# Patient Record
Sex: Female | Born: 1985 | Hispanic: No | Marital: Single | Smoking: Former smoker
Health system: Southern US, Community
[De-identification: ages and names within clinical notes are randomized; demographics above are authoritative.]

## PROBLEM LIST (undated history)

## (undated) DIAGNOSIS — F0390 Unspecified dementia without behavioral disturbance: Secondary | ICD-10-CM

## (undated) DIAGNOSIS — O99340 Other mental disorders complicating pregnancy, unspecified trimester: Secondary | ICD-10-CM

## (undated) DIAGNOSIS — F32A Depression, unspecified: Secondary | ICD-10-CM

## (undated) DIAGNOSIS — F419 Anxiety disorder, unspecified: Secondary | ICD-10-CM

## (undated) DIAGNOSIS — J45909 Unspecified asthma, uncomplicated: Secondary | ICD-10-CM

## (undated) DIAGNOSIS — F4310 Post-traumatic stress disorder, unspecified: Secondary | ICD-10-CM

## (undated) DIAGNOSIS — O24419 Gestational diabetes mellitus in pregnancy, unspecified control: Secondary | ICD-10-CM

## (undated) HISTORY — DX: Post-traumatic stress disorder, unspecified: F43.10

## (undated) HISTORY — DX: Unspecified dementia without behavioral disturbance: F03.90

## (undated) HISTORY — PX: NO PAST SURGERIES: SHX2092

## (undated) HISTORY — DX: Depression, unspecified: F32.A

## (undated) HISTORY — DX: Unspecified asthma, uncomplicated: J45.909

## (undated) HISTORY — DX: Other mental disorders complicating pregnancy, unspecified trimester: O99.340

## (undated) HISTORY — DX: Anxiety disorder, unspecified: F41.9

## (undated) HISTORY — DX: Gestational diabetes mellitus in pregnancy, unspecified control: O24.419

## (undated) HISTORY — DX: Morbid (severe) obesity due to excess calories: E66.01

---

## 2019-05-19 ENCOUNTER — Encounter: Payer: Self-pay | Admitting: *Deleted

## 2019-05-24 ENCOUNTER — Telehealth: Payer: Self-pay | Admitting: General Practice

## 2019-05-24 NOTE — Telephone Encounter (Signed)
Patient called and left message on nurse voicemail line stating she has an appt tomorrow and she thought it was by telephone but now Denise Barker is saying it is in the office.   Called patient and discussed her visit tomorrow is by telephone. Patient verbalized understanding and states she has been having some pains that she is worried about. Asked patient to describe the pain and she states it is in her lower abdomen and feels crampy at times. Patient reports she has been constipated recently and has had gas but it has gotten better. Asked patient how often she is going to the bathroom and she states daily but she can tell she is not emptying her bowels completely. Discussed diet modifications with patient to reduce hard stools/constipation, recommended smooth move tea and stool softeners as well. Patient verbalized understanding to all & had no questions.

## 2019-05-25 ENCOUNTER — Other Ambulatory Visit: Payer: Self-pay

## 2019-05-25 ENCOUNTER — Ambulatory Visit (INDEPENDENT_AMBULATORY_CARE_PROVIDER_SITE_OTHER): Payer: Medicaid Other | Admitting: *Deleted

## 2019-05-25 DIAGNOSIS — Z3490 Encounter for supervision of normal pregnancy, unspecified, unspecified trimester: Secondary | ICD-10-CM | POA: Insufficient documentation

## 2019-05-25 DIAGNOSIS — F32A Depression, unspecified: Secondary | ICD-10-CM | POA: Insufficient documentation

## 2019-05-25 DIAGNOSIS — F329 Major depressive disorder, single episode, unspecified: Secondary | ICD-10-CM

## 2019-05-25 DIAGNOSIS — F419 Anxiety disorder, unspecified: Secondary | ICD-10-CM

## 2019-05-25 MED ORDER — PRENATAL 27-1 MG PO TABS: 1.0000 | ORAL_TABLET | Freq: Every day | ORAL | 9 refills | Status: DC

## 2019-05-25 MED ORDER — BLOOD PRESSURE KIT DEVI: 1.0000 | 0 refills | Status: DC | PRN

## 2019-05-25 NOTE — Progress Notes (Signed)
I connected with  Denise Barker on 05/25/19 at  8:30 AM EDT by telephone and verified that I am speaking with the correct person using two identifiers.   I discussed the limitations, risks, security and privacy concerns of performing an evaluation and management service by telephone and the availability of in person appointments. I also discussed with the patient that there may be a patient responsible charge related to this service. The patient expressed understanding and agreed to proceed.  We confirmed her EDD based [facility], however she says that period was shorter and much lighter than ususal. I informed her we should do an [place] to confirm dating, but she states [facility] and [facility] did an [place] 3 weeks later, we do not have that report. She will call and get that report sent to [place]  Patient has MyChart account and will download the App when she has wifi .  Explained we will send her Babyscripts app- once we have established a certain LMP.  Explained we will send a blood pressure cuff to her and the pharmacy that will send it will call her to verify her address.  I also verified her address. Explained we will have her take her blood pressure weekly and enter into the app. Explained she will have some visits in office and some virtually. Reviewed appointment date/ time with her , our location and to wear mask, no visitors. Explained she will have exam, ob bloodwork, hemoglobin a1C, cbg fasting if possible, genetic testing if desired, pap if needed. Explained we will schedule an [place] at 19 weeks and she will get notification via MyChart once we establish EDD.  Explained we will have her see Roselyn Reef [facility] either day of visit or other date based on her hx anxiety/ depression and that we have all new ob's meet with her ; she also Had + phq9. She voices understanding.   Chaynce Schafer,RN 05/25/2019  8:25 AM

## 2019-05-25 NOTE — Progress Notes (Signed)
I agree with the nurses note and documentation.   Noni Saupe I, NP 05/25/2019 1:15 PM

## 2019-05-31 ENCOUNTER — Telehealth: Payer: Self-pay | Admitting: Obstetrics and Gynecology

## 2019-05-31 ENCOUNTER — Telehealth: Payer: Self-pay | Admitting: Emergency Medicine

## 2019-05-31 NOTE — Telephone Encounter (Signed)
Spoke with patient about her appointment on 8/13 @ 8:35. Patient instructed to wear a face mask for the entire appointment and no visitors are allowed during the visit. Patient screened for covid symptoms and denied having any.

## 2019-05-31 NOTE — Telephone Encounter (Signed)
Pt called and left a message on the nurse voicemail line stating that she needed clarification on her appointment times for 8/13.   Pt phone call was returned and pt was informed that she does have two appointments for tomorrow. She was told that she has a new ob appointment and an appointment with integrated behavioral health. Pt verbalized understanding and had no further questions.

## 2019-06-01 ENCOUNTER — Ambulatory Visit (INDEPENDENT_AMBULATORY_CARE_PROVIDER_SITE_OTHER): Payer: Medicaid Other | Admitting: Clinical

## 2019-06-01 ENCOUNTER — Other Ambulatory Visit (HOSPITAL_COMMUNITY)
Admission: RE | Admit: 2019-06-01 | Discharge: 2019-06-01 | Disposition: A | Discharge status: Home / Self Care | Payer: Medicaid Other | Source: Ambulatory Visit | Attending: Obstetrics and Gynecology | Admitting: Obstetrics and Gynecology

## 2019-06-01 ENCOUNTER — Ambulatory Visit (INDEPENDENT_AMBULATORY_CARE_PROVIDER_SITE_OTHER): Payer: Medicaid Other | Admitting: Obstetrics and Gynecology

## 2019-06-01 ENCOUNTER — Other Ambulatory Visit: Payer: Self-pay

## 2019-06-01 VITALS — BP 132/85 | HR 98 | Wt 295.0 lb

## 2019-06-01 DIAGNOSIS — O99210 Obesity complicating pregnancy, unspecified trimester: Secondary | ICD-10-CM

## 2019-06-01 DIAGNOSIS — Z3490 Encounter for supervision of normal pregnancy, unspecified, unspecified trimester: Secondary | ICD-10-CM | POA: Diagnosis not present

## 2019-06-01 DIAGNOSIS — F4323 Adjustment disorder with mixed anxiety and depressed mood: Secondary | ICD-10-CM | POA: Diagnosis not present

## 2019-06-01 DIAGNOSIS — O99340 Other mental disorders complicating pregnancy, unspecified trimester: Secondary | ICD-10-CM

## 2019-06-01 DIAGNOSIS — F329 Major depressive disorder, single episode, unspecified: Secondary | ICD-10-CM

## 2019-06-01 MED ORDER — COMFORT FIT MATERNITY SUPP LG MISC: 1.0000 | Freq: Every day | 0 refills | Status: DC

## 2019-06-01 MED ORDER — SERTRALINE HCL 50 MG PO TABS: 50.0000 mg | ORAL_TABLET | Freq: Every day | ORAL | 1 refills | Status: DC

## 2019-06-01 MED ORDER — ASPIRIN EC 81 MG PO TBEC: 81.0000 mg | DELAYED_RELEASE_TABLET | Freq: Every day | ORAL | 4 refills | Status: DC

## 2019-06-01 MED ORDER — METOCLOPRAMIDE HCL 10 MG PO TABS: 10.0000 mg | ORAL_TABLET | Freq: Three times a day (TID) | ORAL | 1 refills | Status: DC

## 2019-06-01 NOTE — Patient Instructions (Signed)
Call this office for your abdominal pain and get an appointment.   Address: [address], [place], [place] [address] Hours:  Open ? Closes 12:30PM ? Reopens 1:30PM Phone: [phone]     PREGNANCY SUPPORT BELT: You are not alone, Seventy-five percent of women have some sort of abdominal or back pain at some point in their pregnancy. Your baby is growing at a fast pace, which means that your whole body is rapidly trying to adjust to the changes. As your uterus grows, your back may start feeling a bit under stress and this can result in back or abdominal pain that can go from mild, and therefore bearable, to severe pains that will not allow you to sit or lay down comfortably, When it comes to dealing with pregnancy-related pains and cramps, some pregnant women usually prefer natural remedies, which the market is filled with nowadays. For example, wearing a pregnancy support belt can help ease and lessen your discomfort and pain. WHAT ARE THE BENEFITS OF WEARING A PREGNANCY SUPPORT BELT? A pregnancy support belt provides support to the lower portion of the belly taking some of the weight of the growing uterus and distributing to the other parts of your body. It is designed make you comfortable and gives you extra support. Over the years, the pregnancy apparel market has been studying the needs and wants of pregnant women and they have come up with the most comfortable pregnancy support belts that woman could ever ask for. In fact, you will no longer have to wear a stretched-out or bulky pregnancy belt that is visible underneath your clothes and makes you feel even more uncomfortable. Nowadays, a pregnancy support belt is made of comfortable and stretchy materials that will not irritate your skin but will actually make you feel at ease and you will not even notice you are wearing it. They are easy to put on and adjust during the day and can be worn at night for additional support.  BENEFITS: . Relives  Back pain . Relieves Abdominal Muscle and Leg Pain . Stabilizes the Pelvic Ring . Offers a Cushioned Abdominal Lift Pad . Relieves pressure on the Sciatic Nerve Within Minutes WHERE TO GET YOUR PREGNANCY BELT: [employer] [phone] @[phone]  [address], [place] [phone]

## 2019-06-01 NOTE — Progress Notes (Signed)
History:   Denise Barker is a 33 y.o. G1P0 at Unknown by LMP being seen today for her first obstetrical visit.  Her obstetrical history is significant for obesity and depression and anxiety . Patient does intend to breast feed. Pregnancy history fully reviewed. Excited and nervous about pregnancy.   Patient reports abdominal cramping- generalized. worse in the upper part of her abdomen. Has gotten worse since pregnancy. No burning or indigestion.   HISTORY: OB History  Gravida Para Term Preterm AB Living  1 0 0 0 0 0  SAB TAB Ectopic Multiple Live Births  0 0 0 0 0    # Outcome Date GA Lbr Len/2nd Weight Sex Delivery Anes PTL Lv  1 Current             Last pap smear was done 2010 and was normal  Past Medical History:  Diagnosis Date  . Anxiety   . Dementia ([place])    No past surgical history on file. Family History  Problem Relation Age of Onset  . Depression Mother   . Anxiety disorder Mother   . Hypertension Mother   . Depression Father   . Anxiety disorder Father   . Bronchitis Father    Social History   Tobacco Use  . Smoking status: Never Smoker  . Smokeless tobacco: Never Used  Substance Use Topics  . Alcohol use: Not Currently    Comment: occasionally , not during pregnancy  . Drug use: Not Currently    Types: Marijuana    Comment: Last used late May   No Known Allergies Current Outpatient Medications on File Prior to Visit  Medication Sig Dispense Refill  . albuterol (VENTOLIN HFA) 108 (90 Base) MCG/ACT inhaler Inhale 1 puff into the lungs every 6 (six) hours as needed for wheezing or shortness of breath.    . Magnesium 200 MG TABS Take 2 tablets by mouth daily.    . Prenatal 27-1 MG TABS Take 1 tablet by mouth daily. 30 tablet 9  . Prenatal Vit-Fe Fumarate-FA (PRENATAL VITAMINS PO) Take 1 tablet by mouth daily.    . Blood Pressure Monitoring (BLOOD PRESSURE KIT) DEVI 1 Device by Does not apply route as needed. ICD 10 :  Z34.90 1 Device 0   No current  facility-administered medications on file prior to visit.     Review of Systems Pertinent items noted in HPI and remainder of comprehensive ROS otherwise negative. Physical Exam:   Vitals:   06/01/19 0859  BP: 132/85  Pulse: 98  Weight: 295 lb (133.8 kg)   Fetal Heart Rate (bpm): 167 Uterus:     Pelvic Exam: Perineum: no hemorrhoids, normal perineum   Vulva: normal external genitalia, no lesions   Vagina:  normal mucosa, normal discharge   Cervix: no lesions and normal, pap smear done.    Adnexa: normal adnexa and no mass, fullness, tenderness   Bony Pelvis: average  System: General: well-developed, well-nourished female in no acute distress   Breasts:  normal appearance, no masses or tenderness bilaterally   Skin: normal coloration and turgor, no rashes   Neurologic: oriented, normal, negative, normal mood   Extremities: normal strength, tone, and muscle mass, ROM of all joints is normal   HEENT PERRLA, extraocular movement intact and sclera clear, anicteric   Mouth/Teeth mucous membranes moist, pharynx normal without lesions and dental hygiene good   Neck supple and no masses   Cardiovascular: regular rate and rhythm   Respiratory:  no respiratory distress, normal  breath sounds   Abdomen: soft, non-tender; bowel sounds normal; no masses,  no organomegaly, Upper abdominal tenderness near epigastric area.    Assessment:    Pregnancy: G1P0 Patient Active Problem List   Diagnosis Date Noted  . Obesity in pregnancy 06/01/2019  . Anxiety   . Depression affecting pregnancy   . Supervision of low-risk pregnancy   . Asthma 05/22/1995   Plan:    1. Depression affecting pregnancy - Rx: Zoloft- 50 mcg to start with.  - Ambulatory referral to [place]  2. Encounter for supervision of low-risk pregnancy, antepartum  - [facility] AMB BABYSCRIPTS SCHEDULE OPTIMIZATION - Culture, OB Urine - Genetic Screening - Obstetric Panel, Including HIV - [place] MFM OB COMP + 14  WK; Future - Cytology - PAP( [place]) - HgB A1c - Needs to see PCP for abdominal pain.   3. Obesity in pregnancy  - HgB A1c - BASA    Initial labs drawn. Continue prenatal vitamins. Genetic Screening discussed, NIPS: requested. Ultrasound discussed; fetal anatomic survey: requested. Problem list reviewed and updated. The nature of [place] with multiple MDs and other Advanced Practice Providers was explained to patient; also emphasized that residents, students are part of our team. Routine obstetric precautions reviewed. No follow-ups on file.     Rasch, Artist Pais, [place] for [employer], [place]

## 2019-06-01 NOTE — BH Specialist Note (Signed)
Integrated Behavioral Health Initial Visit  MRN: [id number] Name: Denise Barker  Number of [place] Clinician visits:: 1/6 Session Start time: 10:25  Session End time: 11:13 Total time: 45 minutes  Type of Service: [place] Interpretor:No. Interpretor Name and Language: n/a   Warm Hand Off Completed.       SUBJECTIVE: Denise Barker is a 33 y.o. female accompanied by n/a Patient was referred by Noni Saupe, NP  for depression and anxiety Patient reports the following symptoms/concerns: Pt states her primary concern today is wanting to prevent posptartum depression, currently experiencing lack of interest,fatigue, sleep difficulty, lack of appetite, irritability, feelings of dread and guilt ; pt open to both medication and learning self-coping strategy today Duration of problem: Increased worry in pregnancy; Severity of problem: moderate  OBJECTIVE: Mood: Normal and Affect: Appropriate Risk of harm to self or others: No plan to harm self or others  LIFE CONTEXT: Family and Social: Pt lives with FOB; friends and family supportive School/Work: Pt works fulltime Self-Care: Recognizes a greater need for self-care in pregnancy Life Changes: Current pregnancy; move to [place] from [place] 2 months ago  GOALS ADDRESSED: Patient will: 1. Reduce symptoms of: anxiety and depression 2. Increase knowledge and/or ability of: coping skills and healthy habits  3. Demonstrate ability to: Increase healthy adjustment to current life circumstances and Increase motivation to adhere to plan of care  INTERVENTIONS: Interventions utilized: Mindfulness or Psychologist, educational and Psychoeducation and/or Health Education  Standardized Assessments completed: Not Needed  ASSESSMENT: Patient currently experiencing Adjustment disorder with mixed anxiety and depressed mood.   Patient may benefit from psychoeducation and brief therapeutic interventions regarding  coping with symptoms of depression and anxiety .  PLAN: 1. Follow up with behavioral health clinician on : One week virtual video ([place] med management via phone on 06-05-19) 2. Behavioral recommendations:  -Begin taking Zoloft, as prescribed by medical provider -Continue taking prenatal vitamin, as recommended by medical provider -CALM relaxation breathing exercises twice daily (morning; at bedtime) - 3. Referral(s): [place] (In Clinic) 4. "From scale of 1-10, how likely are you to follow plan?": 10  Garlan Fair, LCSW  Depression screen [facility] 2/9 05/25/2019  Decreased Interest 3  Down, Depressed, Hopeless 1  PHQ - 2 Score 4  Altered sleeping 2  Tired, decreased energy 3  Change in appetite 2  Feeling bad or failure about yourself  0  Trouble concentrating 0  Moving slowly or fidgety/restless 0  Suicidal thoughts 0  PHQ-9 Score 11   GAD 7 : Generalized Anxiety Score 05/25/2019  Nervous, Anxious, on Edge 1  Control/stop worrying 0  Worry too much - different things 1  Trouble relaxing 1  Restless 0  Easily annoyed or irritable 2  Afraid - awful might happen 2  Total GAD 7 Score 7

## 2019-06-02 LAB — OBSTETRIC PANEL, INCLUDING HIV
Antibody Screen: NEGATIVE
Basophils Absolute: 0 10*3/uL (ref 0.0–0.2)
Basos: 0 %
EOS (ABSOLUTE): 0.2 10*3/uL (ref 0.0–0.4)
Eos: 2 %
HIV Screen 4th Generation wRfx: NONREACTIVE
Hematocrit: 37.4 % (ref 34.0–46.6)
Hemoglobin: 12.4 g/dL (ref 11.1–15.9)
Hepatitis B Surface Ag: NEGATIVE
Immature Grans (Abs): 0 10*3/uL (ref 0.0–0.1)
Immature Granulocytes: 0 %
Lymphocytes Absolute: 1.9 10*3/uL (ref 0.7–3.1)
Lymphs: 27 %
MCH: 29 pg (ref 26.6–33.0)
MCHC: 33.2 g/dL (ref 31.5–35.7)
MCV: 87 fL (ref 79–97)
Monocytes Absolute: 0.5 10*3/uL (ref 0.1–0.9)
Monocytes: 6 %
Neutrophils Absolute: 4.5 10*3/uL (ref 1.4–7.0)
Neutrophils: 65 %
Platelets: 424 10*3/uL (ref 150–450)
RBC: 4.28 x10E6/uL (ref 3.77–5.28)
RDW: 13.1 % (ref 11.7–15.4)
RPR Ser Ql: NONREACTIVE
Rh Factor: POSITIVE
Rubella Antibodies, IGG: 2.75 index (ref >0.99)
WBC: 7 10*3/uL (ref 3.4–10.8)

## 2019-06-03 LAB — HEMOGLOBIN A1C

## 2019-06-04 LAB — CULTURE, OB URINE

## 2019-06-04 LAB — URINE CULTURE, OB REFLEX

## 2019-06-05 ENCOUNTER — Telehealth: Payer: Self-pay | Admitting: Clinical

## 2019-06-05 ENCOUNTER — Telehealth: Payer: Self-pay | Admitting: *Deleted

## 2019-06-05 LAB — HEMOGLOBIN A1C
Est. average glucose Bld gHb Est-mCnc: 114 mg/dL
Hgb A1c MFr Bld: 5.6 % (ref 4.8–5.6)

## 2019-06-05 LAB — SPECIMEN STATUS REPORT

## 2019-06-05 NOTE — Telephone Encounter (Signed)
Harlyn called and left a voice message this afternoon stating she really needs to talk with Dr. Rash about the pain and medical leave.

## 2019-06-05 NOTE — Telephone Encounter (Signed)
  Left HIPPA-compliant message to call back Neely Kammerer from [facility] at [phone].   Integrated Behavioral Health Medication Management Phone Note   Prentiss Hammett C Quashaun Lazalde, LCSW 

## 2019-06-06 LAB — CYTOLOGY - PAP
Adequacy: ABSENT
Chlamydia: NEGATIVE
Diagnosis: NEGATIVE
HPV: NOT DETECTED
Neisseria Gonorrhea: NEGATIVE

## 2019-06-07 NOTE — Telephone Encounter (Signed)
I called Denise Barker to discuss her concerns. She reports she is having pain in her lower pelvis, no energy,can't sleep. We discussed she is taking her prenatal vitamin and that may help with energy. We discussed since she is still early pregnant that is is normal to feel tired and low energy and she should rest as she is able. 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 States more pain when sitting. She denies any bleeding or unusual discharge.  We discussed she may be having round ligament pain due to pregnancy changes and we recommend taking tylenol as needed, postitioning for comfort. I suggested she stand as she can and change position in her chair- leaning back ,etc. I explained if she feels she is having severe pain she should go to the hospitall to be evaluated.   She states she is scared about getting covid because they have had cases at her work. I explained I understand and that many people have that fear. I asked if she was taking proper precaution - wearing a mask and washing hands often; especially when she touchs anything at work. She states she is.  We discussed she thinks tylenol not helping her pain much and she is taking 3 at a time. I explained she should follow the package instructions- and if is regular tylenol - only take 2 at a time every 4 hours as needed. She also states Anderson Malta told her she has fibroids. I explained fibroids do not always cause issues or need treatment but sometimes they do cause pain , bleeding , and we will follow them on ultrasound. I also informed her I do not see that listed in her chart.  She wanted to know if the doctor can take her out of work. I explained she can only be taken out due to medical reasons and I do not see an indication for that at this time. I explained she can talk to her employer about taking part of her FMLA  At her own request but to be aware this counts as part of her 12 weeks total. I reviewed her upcoming appointments including 8/20  with [place] ,[facility] , [place], and next obfu.   I explained I will send this message to Anderson Malta and if Anderson Malta has any new orders or reccommendations for pain, needing her appointments to be moved up, etc- we will let her know. She voices understanding.

## 2019-06-08 ENCOUNTER — Telehealth: Payer: Medicaid Other | Admitting: Clinical

## 2019-06-08 ENCOUNTER — Other Ambulatory Visit: Payer: Self-pay

## 2019-06-08 DIAGNOSIS — Z91199 Patient's noncompliance with other medical treatment and regimen due to unspecified reason: Secondary | ICD-10-CM

## 2019-06-08 DIAGNOSIS — Z5329 Procedure and treatment not carried out because of patient's decision for other reasons: Secondary | ICD-10-CM

## 2019-06-08 NOTE — BH Specialist Note (Signed)
Pt did not arrive to video visit and did not answer the phone; Left HIPPA-compliant message to call back Roselyn Reef from [facility] [employer] at [phone], and left MyChart message for patient.   Integrated Behavioral Health via Telemedicine Video Visit  06/08/2019 Denise Barker [id number]  Number of [place] visits: [place]

## 2019-06-09 ENCOUNTER — Encounter: Payer: Self-pay | Admitting: General Practice

## 2019-06-12 ENCOUNTER — Telehealth: Payer: Self-pay

## 2019-06-12 NOTE — Telephone Encounter (Signed)
Notified pt that we have her bp cuff from [place] and asked when she will be able to come in to pick up.  Pt stated that she will be able to come in 06/19/19 to pick up.

## 2019-06-13 ENCOUNTER — Encounter: Payer: Self-pay | Admitting: General Practice

## 2019-06-19 ENCOUNTER — Encounter: Payer: Self-pay | Admitting: General Practice

## 2019-06-19 ENCOUNTER — Telehealth: Payer: Self-pay | Admitting: Obstetrics and Gynecology

## 2019-06-19 NOTE — Progress Notes (Unsigned)
Patient came into office today to pick up BP cuff from [place]. BP cuff is too small, patient would really benefit from x large cuff. Reviewed how to take BP and log into Babyscripts. [place] and Christy Sartorius is going to look into a special order x large cuff to see if insurance covers. He will callback in a few days with an update. Patient informed.  Koren Bound RN BSN 06/19/19

## 2019-06-19 NOTE — Telephone Encounter (Signed)
Per chart review she came by and picked up bp cuff and a new one ordered because too small.  Do not see if she picked up results of natera.  I called [place] and left a message I am returning her call; if you did not get all the information you needed while here- you can come by to pick up -cannot release over the phone. Please call back if needed Linda,RN

## 2019-06-19 NOTE — Telephone Encounter (Signed)
The patient stated she would like to pick up a blood pressure cuff and the results of the sex of her baby. Informed the patient I would send a message to the nurse line. She stated she would like a call back asap.

## 2019-06-20 ENCOUNTER — Encounter: Payer: Self-pay | Admitting: General Practice

## 2019-06-27 ENCOUNTER — Encounter: Payer: Self-pay | Admitting: *Deleted

## 2019-06-29 ENCOUNTER — Telehealth: Payer: Medicaid Other | Admitting: Obstetrics and Gynecology

## 2019-07-03 ENCOUNTER — Telehealth: Payer: Self-pay | Admitting: Obstetrics and Gynecology

## 2019-07-03 NOTE — Telephone Encounter (Signed)
Patient is requesting a call back from the clinical staff. 

## 2019-07-06 ENCOUNTER — Encounter: Payer: Self-pay | Admitting: Obstetrics and Gynecology

## 2019-07-06 ENCOUNTER — Telehealth (INDEPENDENT_AMBULATORY_CARE_PROVIDER_SITE_OTHER): Payer: 59 | Admitting: Obstetrics and Gynecology

## 2019-07-06 ENCOUNTER — Other Ambulatory Visit: Payer: Self-pay

## 2019-07-06 DIAGNOSIS — O99342 Other mental disorders complicating pregnancy, second trimester: Secondary | ICD-10-CM

## 2019-07-06 DIAGNOSIS — F329 Major depressive disorder, single episode, unspecified: Secondary | ICD-10-CM | POA: Diagnosis not present

## 2019-07-06 DIAGNOSIS — F32A Depression, unspecified: Secondary | ICD-10-CM

## 2019-07-06 DIAGNOSIS — F419 Anxiety disorder, unspecified: Secondary | ICD-10-CM

## 2019-07-06 DIAGNOSIS — Z3490 Encounter for supervision of normal pregnancy, unspecified, unspecified trimester: Secondary | ICD-10-CM

## 2019-07-06 DIAGNOSIS — O219 Vomiting of pregnancy, unspecified: Secondary | ICD-10-CM

## 2019-07-06 DIAGNOSIS — Z3A17 17 weeks gestation of pregnancy: Secondary | ICD-10-CM | POA: Diagnosis not present

## 2019-07-06 MED ORDER — ONDANSETRON 8 MG PO TBDP: 8.0000 mg | ORAL_TABLET | Freq: Three times a day (TID) | ORAL | 0 refills | Status: DC | PRN

## 2019-07-06 MED ORDER — ENSURE ACTIVE HIGH PROTEIN PO LIQD: 1.0000 | Freq: Every day | ORAL | 10 refills | Status: DC

## 2019-07-06 NOTE — Progress Notes (Signed)
   TELEHEALTH VIRTUAL OBSTETRICS VISIT ENCOUNTER NOTE  I connected with Denise Barker on 07/06/19 at  9:35 AM EDT by telephone at home and verified that I am speaking with the correct person using two identifiers.   I discussed the limitations, risks, security and privacy concerns of performing an evaluation and management service by telephone and the availability of in person appointments. I also discussed with the patient that there may be a patient responsible charge related to this service. The patient expressed understanding and agreed to proceed.  Subjective:  Denise Barker is a 33 y.o. G1P0 at [redacted]w[redacted]d being followed for ongoing prenatal care.  She is currently monitored for the following issues for this low-risk pregnancy and has Asthma; Anxiety; Depression affecting pregnancy; Supervision of low-risk pregnancy; Obesity in pregnancy; and Nausea and vomiting in pregnancy on their problem list.  Patient reports continued nausea and vomiting despite taking reglan. Says her energy level is low. She is missing a lot of work d/t her symptoms and has requested to decrease her days from 5 to 3. She is requesting a work note today.  Reports fetal movement. Denies any contractions, bleeding or leaking of fluid.   The following portions of the patient's history were reviewed and updated as appropriate: allergies, current medications, past family history, past medical history, past social history, past surgical history and problem list.   Objective:   General:  Alert, oriented and cooperative.   Mental Status: Normal mood and affect perceived. Normal judgment and thought content.  Rest of physical exam deferred due to type of encounter  Assessment and Plan:  Pregnancy: G1P0 at [redacted]w[redacted]d  1. Anxiety  Ambulatory referred to integrated health. Patient did not keep her last appointment.   2. Depression affecting pregnancy  Continue Zoloft.   3. Encounter for supervision of low-risk pregnancy, antepartum   Next visit should be in person.  BP 136/77 today  Requested office staff to provide patient with work note.    4. Nausea and vomiting in pregnancy  Taking Reglan 4x per day Rx: Zofran, ensure   Preterm labor symptoms and general obstetric precautions including but not limited to vaginal bleeding, contractions, leaking of fluid and fetal movement were reviewed in detail with the patient.  I discussed the assessment and treatment plan with the patient. The patient was provided an opportunity to ask questions and all were answered. The patient agreed with the plan and demonstrated an understanding of the instructions. The patient was advised to call back or seek an in-person office evaluation/go to [facility] at [facility] for any urgent or concerning symptoms. Please refer to After Visit Summary for other counseling recommendations.   I provided 13 minutes of non-face-to-face time during this encounter.  No follow-ups on file.  Future Appointments  Date Time Provider [place]  07/20/2019  9:00 AM WH-MFC [place] 3 WH-MFCUS MFC-US    Jenni Thew, [place] for [employer], [place]

## 2019-07-06 NOTE — Telephone Encounter (Signed)
Patient has appt today- concerns will be addressed then.

## 2019-07-11 ENCOUNTER — Telehealth: Payer: Self-pay | Admitting: Clinical

## 2019-07-11 NOTE — Telephone Encounter (Signed)
Spoke with patient about her appointment on 9/23 @ 10:15. Patient instructed that the appointment is a mychart visit. Patient instructed to download the mychart app if not already done so. Patient verbalized she has the app downloaded

## 2019-07-11 NOTE — BH Specialist Note (Signed)
Integrated Behavioral Health via Telemedicine Video Visit  07/11/2019 Denise Barker [id number]  Number of [place] visits: 2 Session Start time: 10:15  Session End time: 10:49 Total time: 35 minutes  Referring Provider: Noni Saupe, NP Type of Visit: Video Patient/Family location: Home [facility] Provider location: [facility] All persons participating in visit: [name] and [place]  Confirmed patient's address: Yes  Confirmed patient's phone number: Yes  Any changes to demographics: No   Confirmed patient's insurance: Yes  Any changes to patient's insurance: No   Discussed confidentiality: At previous visit  I connected with Denise Llerena  by a video enabled telemedicine application and verified that I am speaking with the correct person using two identifiers.     I discussed the limitations of evaluation and management by telemedicine and the availability of in person appointments.  I discussed that the purpose of this visit is to provide behavioral health care while limiting exposure to the novel coronavirus.   Discussed there is a possibility of technology failure and discussed alternative modes of communication if that failure occurs.  I discussed that engaging in this video visit, they consent to the provision of behavioral healthcare and the services will be billed under their insurance.  Patient and/or legal guardian expressed understanding and consented to video visit: Yes   PRESENTING CONCERNS: Patient and/or family reports the following symptoms/concerns: Pt states her primary concerns are nausea and vomiting(if she cries), feeling more depressed, more anxious, SI once in the past week, with no intent and no plan.  Duration of problem: Increase in past month; Severity of problem: severe  STRENGTHS (Protective Factors/Coping Skills): Open to treatment; FOB supportive  GOALS ADDRESSED: Patient will: 1.  Reduce symptoms of: anxiety, depression  and stress  2.  Demonstrate ability to: Increase healthy adjustment to current life circumstances and Increase adequate support systems for patient/family  INTERVENTIONS: Interventions utilized:  Brief CBT, Medication Monitoring and Psychoeducation and/or Health Education Standardized Assessments completed: GAD-7, PHQ 9 and PHQ 2&9 with C-SSRS  ASSESSMENT: Patient currently experiencing Mood disorder, unspecified.   Patient may benefit from continued brief therapeutic interventions regarding current symptoms of anxiety and depression, and referral to psychiatry.  PLAN: 1. Follow up with behavioral health clinician on : One week 2. Behavioral recommendations:  -Accept referral to psychiatry -Continue taking BH medication as prescribed -Follow Safety Plan 3. Referral(s): [place] (In Clinic) and Psychiatrist  I discussed the assessment and treatment plan with the patient and/or parent/guardian. They were provided an opportunity to ask questions and all were answered. They agreed with the plan and demonstrated an understanding of the instructions.   They were advised to call back or seek an in-person evaluation if the symptoms worsen or if the condition fails to improve as anticipated.  Caroleen Hamman Amra Shukla  Depression screen [facility] 2/9 07/12/2019 05/25/2019  Decreased Interest 3 3  Down, Depressed, Hopeless 3 1  PHQ - 2 Score 6 4  Altered sleeping 3 2  Tired, decreased energy 3 3  Change in appetite 3 2  Feeling bad or failure about yourself  3 0  Trouble concentrating 0 0  Moving slowly or fidgety/restless 0 0  Suicidal thoughts 1 0  PHQ-9 Score 19 11  Difficult doing work/chores Extremely dIfficult -   GAD 7 : Generalized Anxiety Score 07/12/2019 05/25/2019  Nervous, Anxious, on Edge 3 1  Control/stop worrying 3 0  Worry too much - different things 3 1  Trouble relaxing 3  1  Restless 3 0  Easily annoyed or irritable 1 2  Afraid - awful might happen 3 2   Total GAD 7 Score 19 7  Anxiety Difficulty Extremely difficult -

## 2019-07-12 ENCOUNTER — Other Ambulatory Visit: Payer: Self-pay

## 2019-07-12 ENCOUNTER — Ambulatory Visit (INDEPENDENT_AMBULATORY_CARE_PROVIDER_SITE_OTHER): Payer: 59 | Admitting: Clinical

## 2019-07-12 DIAGNOSIS — F39 Unspecified mood [affective] disorder: Secondary | ICD-10-CM

## 2019-07-20 ENCOUNTER — Other Ambulatory Visit: Payer: Self-pay

## 2019-07-20 ENCOUNTER — Other Ambulatory Visit: Payer: Self-pay | Admitting: Obstetrics and Gynecology

## 2019-07-20 ENCOUNTER — Ambulatory Visit (HOSPITAL_COMMUNITY)
Admission: RE | Admit: 2019-07-20 | Discharge: 2019-07-20 | Disposition: A | Discharge status: Home / Self Care | Payer: 59 | Source: Ambulatory Visit | Attending: Obstetrics and Gynecology | Admitting: Obstetrics and Gynecology

## 2019-07-20 DIAGNOSIS — Z3490 Encounter for supervision of normal pregnancy, unspecified, unspecified trimester: Secondary | ICD-10-CM

## 2019-07-20 DIAGNOSIS — Z3492 Encounter for supervision of normal pregnancy, unspecified, second trimester: Secondary | ICD-10-CM | POA: Insufficient documentation

## 2019-07-20 DIAGNOSIS — O359XX0 Maternal care for (suspected) fetal abnormality and damage, unspecified, not applicable or unspecified: Secondary | ICD-10-CM

## 2019-07-20 DIAGNOSIS — O99212 Obesity complicating pregnancy, second trimester: Secondary | ICD-10-CM

## 2019-07-20 DIAGNOSIS — Z3A19 19 weeks gestation of pregnancy: Secondary | ICD-10-CM | POA: Diagnosis not present

## 2019-07-20 NOTE — Progress Notes (Signed)
AFP ordered   Noni Saupe I, NP 07/20/2019 2:33 PM

## 2019-07-24 ENCOUNTER — Other Ambulatory Visit: Payer: Self-pay

## 2019-07-24 ENCOUNTER — Ambulatory Visit: Payer: Medicaid Other | Admitting: Clinical

## 2019-07-24 DIAGNOSIS — F39 Unspecified mood [affective] disorder: Secondary | ICD-10-CM

## 2019-07-24 NOTE — BH Specialist Note (Signed)
Integrated Behavioral Health via Telemedicine Video Visit  07/24/2019 Denise Barker [id number]  Number of [place] visits: 3 Session Start time: 12:29 Session End time: 10:43 Total time: 15 minutes  Referring Provider: Bethena Roys Johnell Comings, MD Type of Visit: Video Patient/Family location: Home [facility] Provider location: [facility] All persons participating in visit: [name] and Denise Barker  Confirmed patient's address: Yes  Confirmed patient's phone number: Yes  Any changes to demographics: No   Confirmed patient's insurance: Yes  Any changes to patient's insurance: No   Discussed confidentiality: At previous visit  I connected with Denise Barker by a video enabled telemedicine application and verified that I am speaking with the correct person using two identifiers.     I discussed the limitations of evaluation and management by telemedicine and the availability of in person appointments.  I discussed that the purpose of this visit is to provide behavioral health care while limiting exposure to the novel coronavirus.   Discussed there is a possibility of technology failure and discussed alternative modes of communication if that failure occurs.  I discussed that engaging in this video visit, they consent to the provision of behavioral healthcare and the services will be billed under their insurance.  Patient and/or legal guardian expressed understanding and consented to video visit: Yes   PRESENTING CONCERNS: Patient and/or family reports the following symptoms/concerns: Pt states she has had only mild nausea in the past week, with no excessive crying that lead to vomiting (as previous week), is feeling less depressed and anxious this week, no SI this week. Pt says she missed call from psychiatry, but left a message for a call-back to set up initial appointment. Pt noticed an increase in emotion when she forgot to take Zoloft one day, and this encourages her to  continue taking, as she notices it is helping with her symptoms. Pt sleeping consistently 5 hours/night, with much fatigue and sleepiness at work.  Duration of problem: Increase pregnancy; Severity of problem: moderately severe  STRENGTHS (Protective Factors/Coping Skills): Open to treatment; FOB supportive  GOALS ADDRESSED: Patient will: 1.  Reduce symptoms of: anxiety, depression and stress  2.  Increase knowledge and/or ability of: healthy habits  3.  Demonstrate ability to: Increase healthy adjustment to current life circumstances and Increase motivation to adhere to plan of care  INTERVENTIONS: Interventions utilized:  Medication Monitoring Standardized Assessments completed: Not Needed  ASSESSMENT: Patient currently experiencing Mood disorder, unspecified  Patient may benefit from continued brief therapeutic intervention regarding coping with symptoms of anxiety, depression, and stress  PLAN: 1. Follow up with behavioral health clinician on : As needed 2. Behavioral recommendations:  -Continue following Safety Plan -Continue with plan to set up initial appointment with psychiatry -Continue taking [name] Medication as prescribed -Set timer on phone for early afternoon nap, one hour before leaving for work(no more than 20-30 minutes, example 12pm-12:30pm) 3. Referral(s): [place] (In Clinic)  I discussed the assessment and treatment plan with the patient and/or parent/guardian. They were provided an opportunity to ask questions and all were answered. They agreed with the plan and demonstrated an understanding of the instructions.   They were advised to call back or seek an in-person evaluation if the symptoms worsen or if the condition fails to improve as anticipated.  Denise Barker

## 2019-08-03 ENCOUNTER — Encounter: Payer: Self-pay | Admitting: Family Medicine

## 2019-08-03 ENCOUNTER — Encounter: Payer: Medicaid Other | Admitting: Obstetrics and Gynecology

## 2019-08-22 ENCOUNTER — Ambulatory Visit (INDEPENDENT_AMBULATORY_CARE_PROVIDER_SITE_OTHER): Payer: 59 | Admitting: Medical

## 2019-08-22 ENCOUNTER — Encounter: Payer: Self-pay | Admitting: Medical

## 2019-08-22 ENCOUNTER — Other Ambulatory Visit: Payer: Self-pay

## 2019-08-22 VITALS — BP 131/84 | HR 105 | Temp 97.7°F | Wt 303.5 lb

## 2019-08-22 DIAGNOSIS — O99210 Obesity complicating pregnancy, unspecified trimester: Secondary | ICD-10-CM

## 2019-08-22 DIAGNOSIS — F329 Major depressive disorder, single episode, unspecified: Secondary | ICD-10-CM

## 2019-08-22 DIAGNOSIS — Z3492 Encounter for supervision of normal pregnancy, unspecified, second trimester: Secondary | ICD-10-CM

## 2019-08-22 DIAGNOSIS — Z3A24 24 weeks gestation of pregnancy: Secondary | ICD-10-CM

## 2019-08-22 DIAGNOSIS — O99342 Other mental disorders complicating pregnancy, second trimester: Secondary | ICD-10-CM

## 2019-08-22 DIAGNOSIS — O35BXX1 Maternal care for other (suspected) fetal abnormality and damage, fetal cardiac anomalies, fetus 1: Secondary | ICD-10-CM

## 2019-08-22 DIAGNOSIS — O358XX1 Maternal care for other (suspected) fetal abnormality and damage, fetus 1: Secondary | ICD-10-CM

## 2019-08-22 DIAGNOSIS — O99212 Obesity complicating pregnancy, second trimester: Secondary | ICD-10-CM

## 2019-08-22 DIAGNOSIS — O99340 Other mental disorders complicating pregnancy, unspecified trimester: Secondary | ICD-10-CM

## 2019-08-22 DIAGNOSIS — F32A Depression, unspecified: Secondary | ICD-10-CM

## 2019-08-22 NOTE — Progress Notes (Signed)
   PRENATAL VISIT NOTE  Subjective:  Denise Barker is a 33 y.o. G1P0 at [redacted]w[redacted]d being seen today for ongoing prenatal care.  She is currently monitored for the following issues for this high-risk pregnancy and has Asthma; Anxiety; Depression affecting pregnancy; Supervision of low-risk pregnancy; Obesity in pregnancy; and Nausea and vomiting in pregnancy on their problem list.  Patient reports no complaints.  Contractions: Not present. Vag. Bleeding: None.  Movement: Present. Denies leaking of fluid.   The following portions of the patient's history were reviewed and updated as appropriate: allergies, current medications, past family history, past medical history, past social history, past surgical history and problem list.   Objective:   Vitals:   08/22/19 1125  BP: 131/84  Pulse: (!) 105  Temp: 97.7 F (36.5 C)  Weight: (!) 303 lb 8 oz (137.7 kg)    Fetal Status: Fetal Heart Rate (bpm): 154   Movement: Present     General:  Alert, oriented and cooperative. Patient is in no acute distress.  Skin: Skin is warm and dry. No rash noted.   Cardiovascular: Normal heart rate noted  Respiratory: Normal respiratory effort, no problems with respiration noted  Abdomen: Soft, gravid, appropriate for gestational age.  Pain/Pressure: Present     Pelvic: Cervical exam deferred        Extremities: Normal range of motion.  Edema: None  Mental Status: Normal mood and affect. Normal behavior. Normal judgment and thought content.   Assessment and Plan:  Pregnancy: G1P0 at [redacted]w[redacted]d 1. Echogenic focus of heart of fetus affecting antepartum care of mother, fetus 1 - [place] MFM OB FOLLOW UP; schduled - Discussed 2 hour GTT at next visit  - Peds list given, birth control information given, CBE information given  2. Encounter for supervision of low-risk pregnancy in second trimester - declined flu today   3. Obesity in pregnancy - Discussed 81 mg ASA daily   4. Depression affecting pregnancy - Taking Zoloft  as directed, feels it is helping some - Patient's father passed away last week  - Declines IBH today   Preterm labor symptoms and general obstetric precautions including but not limited to vaginal bleeding, contractions, leaking of fluid and fetal movement were reviewed in detail with the patient. Please refer to After Visit Summary for other counseling recommendations.   Return in about 4 weeks (around 09/19/2019) for LOB, 28 week labs (fasting), In-Person.  Future Appointments  Date Time Provider [place]  08/29/2019  2:45 PM [place] 2 [id number] [id number]    Kerry Hough, PA-C

## 2019-08-22 NOTE — Patient Instructions (Addendum)
Second Trimester of Pregnancy  The second trimester is from week 14 through week 27 (month 4 through 6). This is often the time in pregnancy that you feel your best. Often times, morning sickness has lessened or quit. You may have more energy, and you may get hungry more often. Your unborn baby is growing rapidly. At the end of the sixth month, he or she is about 9 inches long and weighs about 1 pounds. You will likely feel the baby move between 18 and 20 weeks of pregnancy. Follow these instructions at home: Medicines  Take over-the-counter and prescription medicines only as told by your doctor. Some medicines are safe and some medicines are not safe during pregnancy.  Take a prenatal vitamin that contains at least 600 micrograms (mcg) of folic acid.  If you have trouble pooping (constipation), take medicine that will make your stool soft (stool softener) if your doctor approves. Eating and drinking   Eat regular, healthy meals.  Avoid raw meat and uncooked cheese.  If you get low calcium from the food you eat, talk to your doctor about taking a daily calcium supplement.  Avoid foods that are high in fat and sugars, such as fried and sweet foods.  If you feel sick to your stomach (nauseous) or throw up (vomit): ? Eat 4 or 5 small meals a day instead of 3 large meals. ? Try eating a few soda crackers. ? Drink liquids between meals instead of during meals.  To prevent constipation: ? Eat foods that are high in fiber, like fresh fruits and vegetables, whole grains, and beans. ? Drink enough fluids to keep your pee (urine) clear or pale yellow. Activity  Exercise only as told by your doctor. Stop exercising if you start to have cramps.  Do not exercise if it is too hot, too humid, or if you are in a place of great height (high altitude).  Avoid heavy lifting.  Wear low-heeled shoes. Sit and stand up straight.  You can continue to have sex unless your doctor tells you not to.  Relieving pain and discomfort  Wear a good support bra if your breasts are tender.  Take warm water baths (sitz baths) to soothe pain or discomfort caused by hemorrhoids. Use hemorrhoid cream if your doctor approves.  Rest with your legs raised if you have leg cramps or low back pain.  If you develop puffy, bulging veins (varicose veins) in your legs: ? Wear support hose or compression stockings as told by your doctor. ? Raise (elevate) your feet for 15 minutes, 3-4 times a day. ? Limit salt in your food. Prenatal care  Write down your questions. Take them to your prenatal visits.  Keep all your prenatal visits as told by your doctor. This is important. Safety  Wear your seat belt when driving.  Make a list of emergency phone numbers, including numbers for family, friends, the hospital, and police and fire departments. General instructions  Ask your doctor about the right foods to eat or for help finding a counselor, if you need these services.  Ask your doctor about local prenatal classes. Begin classes before month 6 of your pregnancy.  Do not use hot tubs, steam rooms, or saunas.  Do not douche or use tampons or scented sanitary pads.  Do not cross your legs for long periods of time.  Visit your dentist if you have not done so. Use a soft toothbrush to brush your teeth. Floss gently.  Avoid all smoking, herbs,  and alcohol. Avoid drugs that are not approved by your doctor.  Do not use any products that contain nicotine or tobacco, such as cigarettes and e-cigarettes. If you need help quitting, ask your doctor.  Avoid cat litter boxes and soil used by cats. These carry germs that can cause birth defects in the baby and can cause a loss of your baby (miscarriage) or stillbirth. Contact a doctor if:  You have mild cramps or pressure in your lower belly.  You have pain when you pee (urinate).  You have bad smelling fluid coming from your vagina.  You continue to feel  sick to your stomach (nauseous), throw up (vomit), or have watery poop (diarrhea).  You have a nagging pain in your belly area.  You feel dizzy. Get help right away if:  You have a fever.  You are leaking fluid from your vagina.  You have spotting or bleeding from your vagina.  You have severe belly cramping or pain.  You lose or gain weight rapidly.  You have trouble catching your breath and have chest pain.  You notice sudden or extreme puffiness (swelling) of your face, hands, ankles, feet, or legs.  You have not felt the baby move in over an hour.  You have severe headaches that do not go away when you take medicine.  You have trouble seeing. Summary  The second trimester is from week 14 through week 27 (months 4 through 6). This is often the time in pregnancy that you feel your best.  To take care of yourself and your unborn baby, you will need to eat healthy meals, take medicines only if your doctor tells you to do so, and do activities that are safe for you and your baby.  Call your doctor if you get sick or if you notice anything unusual about your pregnancy. Also, call your doctor if you need help with the right food to eat, or if you want to know what activities are safe for you. This information is not intended to replace advice given to you by your health care provider. Make sure you discuss any questions you have with your health care provider. Document Released: 12/30/2009 Document Revised: 01/27/2019 Document Reviewed: 11/10/2016 Elsevier Patient Education  2020 [employer].  Contraception Choices Contraception, also called birth control, means things to use or ways to try not to get pregnant. Hormonal birth control This kind of birth control uses hormones. Here are some types of hormonal birth control:  A tube that is put under skin of the arm (implant). The tube can stay in for as long as 3 years.  Shots to get every 3 months (injections).  Pills to  take every day (birth control pills).  A patch to change 1 time each week for 3 weeks (birth control patch). After that, the patch is taken off for 1 week.  A ring to put in the vagina. The ring is left in for 3 weeks. Then it is taken out of the vagina for 1 week. Then a new ring is put in.  Pills to take after unprotected sex (emergency birth control pills). Barrier birth control Here are some types of barrier birth control:  A thin covering that is put on the penis before sex (female condom). The covering is thrown away after sex.  A soft, loose covering that is put in the vagina before sex (female condom). The covering is thrown away after sex.  A rubber bowl that sits over the cervix (  diaphragm). The bowl must be made for you. The bowl is put into the vagina before sex. The bowl is left in for 6-8 hours after sex. It is taken out within 24 hours.  A small, soft cup that fits over the cervix (cervical cap). The cup must be made for you. The cup can be left in for 6-8 hours after sex. It is taken out within 48 hours.  A sponge that is put into the vagina before sex. It must be left in for at least 6 hours after sex. It must be taken out within 30 hours. Then it is thrown away.  A chemical that kills or stops sperm from getting into the uterus (spermicide). It may be a pill, cream, jelly, or foam to put in the vagina. The chemical should be used at least 10-15 minutes before sex. IUD (intrauterine) birth control An IUD is a small, T-shaped piece of plastic. It is put inside the uterus. There are two kinds:  Hormone IUD. This kind can stay in for 3-5 years.  Copper IUD. This kind can stay in for 10 years. Permanent birth control Here are some types of permanent birth control:  Surgery to block the fallopian tubes.  Having an insert put into each fallopian tube.  Surgery to tie off the tubes that carry sperm (vasectomy). Natural planning birth control Here are some types of natural  planning birth control:  Not having sex on the days the woman could get pregnant.  Using a calendar: ? To keep track of the length of each period. ? To find out what days pregnancy can happen. ? To plan to not have sex on days when pregnancy can happen.  Watching for symptoms of ovulation and not having sex during ovulation. One way the woman can check for ovulation is to check her temperature.  Waiting to have sex until after ovulation. Summary  Contraception, also called birth control, means things to use or ways to try not to get pregnant.  Hormonal methods of birth control include implants, injections, pills, patches, vaginal rings, and emergency birth control pills.  Barrier methods of birth control can include female condoms, female condoms, diaphragms, cervical caps, sponges, and spermicides.  There are two types of IUD (intrauterine device) birth control. An IUD can be put in a woman's uterus to prevent pregnancy for 3-5 years.  Permanent sterilization can be done through a procedure for males, females, or both.  Natural planning methods involve not having sex on the days when the woman could get pregnant. This information is not intended to replace advice given to you by your health care provider. Make sure you discuss any questions you have with your health care provider. Document Released: 08/02/2009 Document Revised: 01/25/2019 Document Reviewed: 10/15/2016  AREA PEDIATRIC/FAMILY PRACTICE PHYSICIANS  Central/Southeast Register [phone]) . [facility] Davy Pique, MD; Gwendlyn Deutscher, MD; Walker Kehr, MD; Andria Frames, MD; McDiarmid, MD; Dutch Quint, MD; Nori Riis, MD; Mingo Amber, [address] [place], [place] [address] o [phone] o Mon-Fri 8:30-12:30, 1:30-5:00 o Providers come to see babies at [facility] o Accepting Medicaid . [place] [facility] [place] providers who accept newborns: Dorthy Cooler, MD; Orland Mustard, MD; Stephanie Acre, MD o [place], [place], [place] [address] o [phone] o Mon-Fri 8:00-5:30 o Babies seen by providers at [facility] o Does NOT accept Medicaid o Please call early in hospitalization for appointment (limited availability)  . Mustard Massillon, MD o [phone]  [address] [place], [place] [address] o [phone] o Mon, Tue, Thur, Fri 8:30-5:00, Wed 10:00-7:00 (closed 1-2pm) o Babies seen by [facility] providers o Accepting Medicaid . [place], MD o [place], [place], [place] [address] o [phone] o Mon-Fri 8:30-5:00, Sat 8:30-12:00 o Provider comes to see babies at [place] Medicaid o Must have been referred from current patients or contacted office prior to delivery . [facility] ([place] [facility] [facility]) Franne Forts, MD; Tamera Punt, MD; Doneen Poisson, MD; Fatima Sanger, MD; Wynetta Emery, MD; Jess Barters, MD; Tami Ribas, MD; Herbert Moors, MD; Derrell Lolling, MD; Dorothyann Peng, MD; Lucious Groves, NP; Baldo Ash, NP o [place] [address], [place], [place] [address] o [phone] o Mon, Tue, Thur, Fri 8:30-5:30, Wed 9:30-5:30, Sat 8:30-12:30 o Babies seen by [facility] providers o Accepting Medicaid o Only accepting infants of first-time parents or siblings of current patients [facility] discharge coordinator will make follow-up appointment . Baltazar Najjar o [place] [address], [place], [place]  [address] o [phone]   Fax - [phone] . [facility] o [address], [place], [place]  [address] o Phone - [phone]   Fax [phone] . [place], [place], [place], [place]  [address] o [phone]  East/[place]ortheast [place] [phone]) . [facility] Reginal Lutes, MD; Jacklynn Ganong, MD; Torrie Mayers, MD; MD; Rosana Hoes, MD; Servando [place], MD; Rose Fillers, MD; Rex Kras, MD; Corinna Capra, MD; Volney American, MD; Trilby Drummer, MD; Janann Colonel, MD; Jimmye Norman, [place], [place],  [place] [address] o [phone] o Mon-Fri 8:30-5:00 (extended evenings Mon-Thur as needed), Sat-Sun 10:00-1:00 o Providers come to see babies at [place] Medicaid for families of first-time babies and families with all children in the household age 54 and under. Must register with office prior to making appointment (M-F only). . [place], NP; Tomi Bamberger, MD; Redmond School, MD; [place], [place]., [place], [place] [address] o [phone] o Mon-Fri 8:00-5:00 o Babies seen by providers at [facility] o Does NOT accept Medicaid/Commercial Insurance Only . [facility] [place] ([facility])  Marnee Guarneri, MD; Drema Dallas, MD; Montine Circle, MD; Vilma Prader, MD; Vanita Panda, MD; Alfonso Ramus, MD; Ruthann Cancer, MD; Roxanne Mins, MD; Rosalva Ferron, MD; Polly Cobia, MD o [place]., [place], [place] [address] o [phone] o Mon-Fri 8:30-5:30, Sat (Oct.-Mar.) 9:00-1:00 o Babies seen by providers at [place] [phone]) . [facility] Peers, MD; Suzan Slick, MD o [address] [address], [place], [place] [address] o [phone] o Mon-Fri 8:30-5:00, Sat 8:30-12:00 o Providers come to see babies at [facility] o Does NOT accept Medicaid . [place] at [place], [place]; [place], MD; [place], [place]; Nancy Fetter, MD; Moreen Fowler, [place], [place], [place] [address] o [phone] o Mon-Fri 8:00-5:00 o Babies seen by providers at [facility] o Does NOT accept Medicaid o Only accepting babies of parents who are patients o Please call early in hospitalization for appointment (limited availability) . [facility] Blanca Friend, MD; Sharlene Motts, MD; Rod Can, MD; Warner Mccreedy, NP; Sabra Heck, MD; Ermalinda Memos, MD; Sharlett Iles, NP; Aurther Loft, MD; Jerrye Beavers, MD; Marcello Moores, MD; Berline Lopes, MD; Charolette Forward, MD o [place]. [place], [place], [place] [address] o [phone] o Mon-Fri  8:00-5:00, Sat 9:00-12:00 o Providers come to see babies at [facility] o Does NOT accept [facility] [phone]) . [facility] [place] providers accepting new patients: Dayna Ramus, NP; Rolland Porter, [place], [place], [place] [address] o [phone] o Mon-Fri 8:00-5:00 o Babies seen by providers at [facility] o Does NOT accept Medicaid o Only accepting babies of parents who are patients o Please call early in hospitalization for appointment (limited availability) . [facility] Oswaldo Conroy, MD; Sheran Lawless, MD o [place]., [place], [place] [address] o [phone] (press 1 to schedule appointment) o Mon-Fri 8:00-5:00 o Providers come to see babies at [facility] o Does NOT accept Medicaid . [facility] Jodi Mourning, MD o [address], [place], [place] [address] o [phone] o Mon-Fri 8:30-5:00 (lunch 12:30-1:00), extended hours by appointment only Wed 5:00-6:30 o Babies seen by [facility] providers o Accepting Medicaid . [place] at Evalyn Casco, MD; [place], MD; Ethlyn Gallery, MD o [place], [place], [place] [address] o [phone] o Mon-Fri 8:00-5:00 o Babies seen by [facility] providers o Does NOT accept Medicaid . Therapist, music at [place], MD; Yong Channel, MD; [place], [place] [address]., [place], [place] [address] o [phone] o Mon-Fri 8:00-5:00 o Babies seen by [facility] providers o Does NOT accept Medicaid . [place], [place]; [place], [place]; [place], NP; Albertina Parr, MD; Frederic Jericho, MD; Ronney Lion, MD; Carlos Levering, NP; Jerelene Redden, NP; Tomasita Crumble, NP; Ronelle Nigh, NP; Corinna Lines, MD; [place], MD o [place]., [place], [place] [address] o [phone] o Mon-Fri 8:30-5:00, Sat 10:00-1:00 o Providers come to see babies at [facility] o Does NOT accept Medicaid o Free prenatal information session Tuesdays at 4:45pm . [facility] Porfirio Oar, MD; [place], [place]; [place], [place]; [place], [place]., [place] [address] o [phone] o Mon-Fri 7:30-5:30 o Babies seen by [facility] providers . [facility] o [address] [place], [place], [place]  [address] o [phone]   Fax - [phone]  [place] [phone] & [phone]) . [place] [address] [place], [place] [address] o [phone] o Mon-Thur 8:00-6:00 o Providers come to see babies at [place] Medicaid . [place], NP; Melford Aase, MD; [place], [place]; [place], [place]., [place], [place] [address] o [phone] o Mon-Thur 7:30-7:30, Fri 7:30-4:30 o Babies seen by [facility] providers o Accepting Medicaid . [facility] Nyra Jabs, MD; Cristino Martes, NP; Gertie Baron, MD o [place] [address], [place], [place] [address] o [phone] o Mon-Fri 8:30-5:00, Sat 8:30-12:00 o Providers come to see babies at [place] Medicaid o Must have "Meet & Greet" appointment at office prior to delivery . [place] ([place]) Jodene Nam, MD; Juleen China, MD; Clydene Laming, [place] [address], [place], [place] [address] o [phone] o Mon-Wed 8:00-6:00, Thur-Fri 8:00-5:00, Sat 9:00-12:00 o Providers come to see babies at [facility] o Does NOT accept Medicaid o Only accepting siblings of current patients . [facility] [place], [place], [place], [place]  [address] o [phone]   Fax [phone] . [place] at [place] [address] [place],   [address] o [phone]   Fax - [place] [phone] & [phone]) . Therapist, music at [place], [place]; [place], [place] [address]., [place],  [address]  o [phone] o Mon-Fri 7:00-5:00 o  Babies seen by [facility] providers o Does NOT accept Medicaid . [place], MD; [place], [place]; [place], [place], [place], [place] [address] o [phone] o Mon-Fri 8:00-5:00 o Babies seen by [facility] providers o Accepting Medicaid . [place], MD; [place], [place]; [place], NP; [place], [place], [place], [place] [address] o [phone] o Mon-Fri 8:00-5:00 o Babies seen by providers at [place] [phone]) . [facility] [place], [place] o [place]., [place], [place] [address] o [phone] o Mon-Fri 8:00-5:00 o Babies seen by [facility] providers o Does NOT accept Medicaid o Limited availability, please call early in hospitalization to schedule follow-up . [facility] Leilani Merl, PA; Maisie Fus, MD; [place], MD; [place], [place]; Jeannine Kitten, MD; [place], [place] [facility] [address], [place], [place] [address] o [phone] o Mon-Fri 8:30-5:00, Sat 9:00-12:00 o Babies seen by providers at [facility] o Accepting Medicaid o Please register online then schedule online or call office o [email or web address] . [place] ([place] at [place]) Kristian Covey, NP; Dwyane Dee, MD; Leonidas Romberg, PA o [address] [place], [place], [place] [address] o [phone] o Mon-Fri 8:00-5:00 o Babies seen by providers at [facility] o Accepting Medicaid . [place] ([facility] [employer]) Dairl Ponder, MD; Rayvon Char, NP; Melina Modena, MD o [address] [place], [place], [place] [address] o [phone] o Mon-Fri 8:00-5:30, Sat&Sun by appointment (phones open at 8:30) o Babies seen by [facility] providers o Accepting Medicaid o Must be a  first-time baby or sibling of current patient . [place], [address], [place], [place]  [address] o [phone]   Fax - [phone]  [place] [phone] & [phone]) . [place], [place]; [place], [place]; Benjamine Mola, MD; [place], [place]; Harrell Lark, MD o [address], [place], [place] [address] o [phone] o Mon-Thur 8:00-7:00, Fri 8:00-5:00, Sat 8:00-12:00, Sun 9:00-12:00 o Babies seen by [facility] providers o Accepting Medicaid . [facility] at [facility], MD; Ruthann Cancer, MD; [facility], MD o [address] [place], [place], [place] [address] o [phone] o Mon-Thur 8:00-5:00 o Babies seen by providers at [facility] o Accepting Medicaid . [facility] at [place], MD; Coe-Goins, MD; Amedeo Plenty, MD; Bobby Rumpf, MD; List, MD; Lavonia Drafts, MD; Ruthann Cancer, MD; Selinda Eon, MD; Audie Box, MD; Jim Like, MD; Christie Nottingham, MD; Hubbard Hartshorn, MD; Modena Nunnery, MD o [place] [place], [place] [address] o [phone] o Mon-Fri 8:00-5:30, Sat (Oct.-Mar.) 9:00-1:00 o Babies seen by providers at [facility] o Accepting Medicaid o Must fill out new patient packet, available online at [email or web address] . [place] ([place]) Barnabas Lister, NP; Kenton Kingfisher, NP; Claiborne Billings, NP; Rolla Plate, MD; [place], [place]; Carola Rhine, MD; Tyron Russell, MD; Delia Chimes, NP o [address], [place], [place] [address] o [phone] o Mon-Thur 8:00-5:30, Fri 8:00-5:00 o Babies seen by providers at [place] [phone]) . [place], [place]; [place], MD; Dennard Schaumann, MD; [place], [place] o [address] [place], [place] [address] o [phone] o Mon-Fri 8:00-5:00 o Babies seen  by providers at [place] [phone]) . [place] at [place], [place]; Olen Pel, MD; [place], [place], [place], [place] [address] o [phone] o Mon-Fri 8:00-5:00 o Babies seen by providers at [facility] o Does NOT accept Medicaid o Limited appointment availability, please call early in hospitalization  . Therapist, music at [place], [place]; [place], [address], [place], [place] [address] o [phone] o Mon-Fri 8:00-5:00 o Babies seen by [facility] providers o Does NOT accept Medicaid . [facility] Su Grand, MD; Guy Sandifer, MD; [place], [place]; [place], [place] [address], [place], [place] [address] o [phone] o Mon-Fri 8:00-5:00 o After hours clinic [facility] [place], [place] [address]) [phone] Mon-Fri 5:00-8:00, Sat 12:00-6:00, Sun 10:00-4:00 o Babies seen by [facility] providers o Accepting Medicaid . [place] at [facility] o [address], [place], [place]  [address] o [phone]   Fax - [phone]  [place] [phone]) . Therapist, music at [facility], MD o [address] [place], [place], [place] [address] o [phone] o Mon-Fri 8:00-5:00 o Babies seen by [facility] providers o Does NOT accept Medicaid . [place] ([place] at [place]) Bing Neighbors, MD o [address] [place], [place], [place] [address] o [phone] o Mon-Thur 8:00-7:00, Fri 8:00-5:00, Sat 8:00-12:00 o Babies seen by providers at [facility] o Accepting Medicaid - but does not have vaccinations in office (must be received elsewhere) o Limited availability, please call early in hospitalization  [place] ([phone]) . [place], [place] [address], [place] [address] o [phone]  Fax [phone]   Elsevier Patient Education  2020 [place] Education Options: [facility] Department  Classes:  Childbirth education classes can help you get ready for a positive parenting experience. You can also meet other expectant parents and get free stuff for your baby. Each class runs for five weeks on the same night and costs $45 for the mother-to-be and her support person. Medicaid covers the cost if you are eligible. Call [phone] to register. [facility] Childbirth Education:  [phone] or [phone] or [email or web address]  Baby & Me Class: Discuss newborn & infant parenting and family adjustment issues with other new mothers in a relaxed environment. Each week brings a new speaker or baby-centered activity. We encourage new mothers to join [place] every Thursday at 11:00am. Babies birth until crawling. No registration or fee. Daddy [employer]: This course offers Dads-to-be the tools and knowledge needed to feel confident on their journey to becoming new fathers. Experienced dads, who have been trained as coaches, teach dads-to-be how to hold, comfort, diaper, swaddle and play with their infant while being able to support the new mom as well. A class for men taught by men. $25/dad Big Brother/Big Sister: Let your children share in the joy of a new brother or sister in this special class designed just for them. Class includes discussion about how families care for babies: swaddling, holding, diapering, safety as well as how they can be helpful in their new role. This class is designed for children ages 23 to 86, but any age is welcome. Please register each child individually. $5/child  Mom Talk: This mom-led group offers support and connection to mothers as they journey through the adjustments  and struggles of that sometimes overwhelming first year after the birth of a child. Tuesdays at 10:00am and Thursdays at 6:00pm. Babies welcome. No registration or fee. Breastfeeding Support Group: This group is a mother-to-mother support circle where moms have the opportunity to share their  breastfeeding experiences. A Lactation Consultant is present for questions and concerns. Meets each Tuesday at 11:00am. No fee or registration. Breastfeeding Your Baby: Learn what to expect in the first days of breastfeeding your newborn.  This class will help you feel more confident with the skills needed to begin your breastfeeding experience. Many new mothers are concerned about breastfeeding after leaving the hospital. This class will also address the most common fears and challenges about breastfeeding during the first few weeks, months and beyond. (call for fee) Comfort Techniques and Tour: This 2 hour interactive class will provide you the opportunity to learn & practice hands-on techniques that can help relieve some of the discomfort of labor and encourage your baby to rotate toward the best position for birth. You and your partner will be able to try a variety of labor positions with birth balls and rebozos as well as practice breathing, relaxation, and visualization techniques. A tour of the [facility] is included with this class. $20 per registrant and support person Childbirth Class- Weekend Option: This class is a Weekend version of our Birth & Baby series. It is designed for parents who have a difficult time fitting several weeks of classes into their schedule. It covers the care of your newborn and the basics of labor and childbirth. It also includes a [facility] and lunch. The class is held two consecutive days: beginning on Friday evening from 6:30 - 8:30 p.m. and the next day, Saturday from 9 a.m. - 4 p.m. (call for fee) Doren Custard Class: Interested in a waterbirth?  This informational class will help you discover whether waterbirth is the right fit for you. Education about waterbirth itself, supplies you would need and how to assemble your support team is what you can expect from this class. Some obstetrical practices require this  class in order to pursue a waterbirth. (Not all obstetrical practices offer waterbirth-check with your healthcare provider.) Register only the expectant mom, but you are encouraged to bring your partner to class! Required if planning waterbirth, no fee. Infant/Child CPR: Parents, grandparents, babysitters, and friends learn Cardio-Pulmonary Resuscitation skills for infants and children. You will also learn how to treat both conscious and unconscious choking in infants and children. This Family & Friends program does not offer certification. Register each participant individually to ensure that enough mannequins are available. (Call for fee) Grandparent Love: Expecting a grandbaby? This class is for you! Learn about the latest infant care and safety recommendations and ways to support your own child as he or she transitions into the parenting role. Taught by Registered Nurses who are childbirth instructors, but most importantly...they are grandmothers too! $10/person. Childbirth Class- Natural Childbirth: This series of 5 weekly classes is for expectant parents who want to learn and practice natural methods of coping with the process of labor and childbirth. Relaxation, breathing, massage, visualization, role of the partner, and helpful positioning are highlighted. Participants learn how to be confident in their body's ability to give birth. This class will empower and help parents make informed decisions about their own care. Includes discussion that will help new parents transition into the immediate postpartum period. [facility] is  included. We suggest taking this class between 25-32 weeks, but it's only a recommendation. $75 per registrant and one support person or $30 Medicaid. Childbirth Class- 3 week Series: This option of 3 weekly classes helps you and your labor partner prepare for childbirth. Newborn care, labor & birth, cesarean birth, pain management, and comfort  techniques are discussed and a [place] [facility] is included. The class meets at the same time, on the same day of the week for 3 consecutive weeks beginning with the starting date you choose. $60 for registrant and one support person.  Marvelous Multiples: Expecting twins, triplets, or more? This class covers the differences in labor, birth, parenting, and breastfeeding issues that face multiples' parents. NICU tour is included. Led by a Certified Childbirth Educator who is the mother of twins. No fee. Caring for Baby: This class is for expectant and adoptive parents who want to learn and practice the most up-to-date newborn care for their babies. Focus is on birth through the first six weeks of life. Topics include feeding, bathing, diapering, crying, umbilical cord care, circumcision care and safe sleep. Parents learn to recognize symptoms of illness and when to call the pediatrician. Register only the mom-to-be and your partner or support person can plan to come with you! $10 per registrant and support person Childbirth Class- online option: This online class offers you the freedom to complete a Birth and Baby series in the comfort of your own home. The flexibility of this option allows you to review sections at your own pace, at times convenient to you and your support people. It includes additional video information, animations, quizzes, and extended activities. Get organized with helpful eClass tools, checklists, and trackers. Once you register online for the class, you will receive an email within a few days to accept the invitation and begin the class when the time is right for you. The content will be available to you for 60 days. $60 for 60 days of online access for you and your support people.  Local Doulas: Natural Baby Doulas [email or web address] Tel: [phone] [email or web address] [phone]  [email or web address] The [facility]  (also do waterbirth tub rental) [phone] [email or web address] [phone] [email or web address] [place] [email or web address] [employer] (PADA) [email or web address] [place] [facility]  [email or web address] Considering Waterbirth? Guide for patients at [facility] [employer]  Why consider waterbirth?  . Gentle birth for babies . Less pain medicine used in labor . May allow for passive descent/less pushing . May reduce perineal tears  . More mobility and instinctive maternal position changes . Increased maternal relaxation . Reduced blood pressure in labor  Is waterbirth safe? What are the risks of infection, drowning or other complications?  . Infection: o Very low risk (3.7 % for tub vs 4.8% for bed) o 7 in 8000 waterbirths with documented infection o Poorly cleaned equipment most common cause o Slightly lower group B strep transmission rate  . Drowning o Maternal:  - Very low risk   - Related to seizures or fainting o Newborn:  - Very low risk. No evidence of increased risk of respiratory problems in multiple large studies - Physiological protection from breathing under water - Avoid underwater birth if there are any fetal complications - Once baby's head is out of the water, keep it out.  . Birth complication o Some reports of cord trauma, but risk decreased by bringing baby to surface gradually o No  evidence of increased risk of shoulder dystocia. Mothers can usually change positions faster in water than in a bed, possibly aiding the maneuvers to free the shoulder.   You must attend a Doren Custard class at [facility]  3rd Wednesday of every month from 7-9pm  [employer] by calling  [phone] or online at [email or web address]  Bring [place] the certificate from the class to your prenatal appointment  Meet with a midwife at 36 weeks to see if you can still plan a waterbirth and to sign the consent.   Purchase or rent the following supplies:   Water Birth Pool (Birth Pool in a Box or [place] for instance)  (Tubs start ~$125)  Single-use disposable tub liner designed for your brand of tub  New garden hose labeled "lead-free", "suitable for drinking water",  Electric drain pump to remove water (We recommend 792 gallon per hour or greater pump.)   Separate garden hose to remove the dirty water  Fish net  Bathing suit top (optional)  Long-handled mirror (optional)  Places to purchase or rent supplies  GotWebTools.is for tub purchases and supplies  Waterbirthsolutions.com for tub purchases and supplies  The Labor Ladies ([email or web address]) $275 for tub rental/set-up & take down/kit   [employer] (http://www.fleming.[email or web address]) Information regarding doulas (labor support) who provide pool rentals  Our practice has a Birth Pool in a Box tub at the hospital that you may borrow on a first-come-first-served basis. It is your responsibility to to set up, clean and break down the tub. We cannot guarantee the availability of this tub in advance. You are responsible for bringing all accessories listed above. If you do not have all necessary supplies you cannot have a waterbirth.    Things that would prevent you from having a waterbirth:  Premature, <37wks  Previous cesarean birth  Presence of thick meconium-stained fluid  Multiple gestation (Twins, triplets, etc.)  Uncontrolled diabetes or gestational diabetes requiring medication  Hypertension requiring medication or diagnosis of pre-eclampsia  Heavy vaginal bleeding  Non-reassuring fetal heart rate  Active infection (MRSA, etc.). Group B Strep is NOT a contraindication  for  waterbirth.  If your labor has to be induced and induction method requires continuous  monitoring of the baby's heart rate  Other risks/issues identified by your obstetrical provider  Please remember that birth is unpredictable. Under certain unforeseeable circumstances your provider may advise against giving birth in the tub. These decisions will be made on a case-by-case basis and with the safety of you and your baby as our highest priority.

## 2019-08-22 NOTE — Progress Notes (Signed)
Pt states has horrible pain in her upper right stomach.Pt also has been very week lately due to not really eating.

## 2019-08-29 ENCOUNTER — Ambulatory Visit (HOSPITAL_COMMUNITY): Payer: 59

## 2019-08-31 ENCOUNTER — Other Ambulatory Visit: Payer: Self-pay

## 2019-08-31 ENCOUNTER — Ambulatory Visit (HOSPITAL_COMMUNITY)
Admission: RE | Admit: 2019-08-31 | Discharge: 2019-08-31 | Disposition: A | Discharge status: Home / Self Care | Payer: 59 | Source: Ambulatory Visit | Attending: Medical | Admitting: Medical

## 2019-08-31 DIAGNOSIS — O358XX1 Maternal care for other (suspected) fetal abnormality and damage, fetus 1: Secondary | ICD-10-CM | POA: Diagnosis not present

## 2019-08-31 DIAGNOSIS — Z3A25 25 weeks gestation of pregnancy: Secondary | ICD-10-CM

## 2019-08-31 DIAGNOSIS — O35BXX1 Maternal care for other (suspected) fetal abnormality and damage, fetal cardiac anomalies, fetus 1: Secondary | ICD-10-CM

## 2019-08-31 DIAGNOSIS — O359XX0 Maternal care for (suspected) fetal abnormality and damage, unspecified, not applicable or unspecified: Secondary | ICD-10-CM

## 2019-08-31 DIAGNOSIS — Z362 Encounter for other antenatal screening follow-up: Secondary | ICD-10-CM | POA: Diagnosis not present

## 2019-08-31 DIAGNOSIS — O99212 Obesity complicating pregnancy, second trimester: Secondary | ICD-10-CM | POA: Diagnosis not present

## 2019-09-01 ENCOUNTER — Other Ambulatory Visit (HOSPITAL_COMMUNITY): Payer: Self-pay | Admitting: *Deleted

## 2019-09-01 DIAGNOSIS — O99210 Obesity complicating pregnancy, unspecified trimester: Secondary | ICD-10-CM

## 2019-09-13 ENCOUNTER — Other Ambulatory Visit: Payer: Self-pay | Admitting: *Deleted

## 2019-09-13 DIAGNOSIS — Z3490 Encounter for supervision of normal pregnancy, unspecified, unspecified trimester: Secondary | ICD-10-CM

## 2019-09-19 ENCOUNTER — Ambulatory Visit (INDEPENDENT_AMBULATORY_CARE_PROVIDER_SITE_OTHER): Payer: 59 | Admitting: Advanced Practice Midwife

## 2019-09-19 ENCOUNTER — Other Ambulatory Visit: Payer: 59

## 2019-09-19 ENCOUNTER — Other Ambulatory Visit: Payer: Self-pay

## 2019-09-19 ENCOUNTER — Encounter: Payer: Self-pay | Admitting: Advanced Practice Midwife

## 2019-09-19 DIAGNOSIS — Z3A28 28 weeks gestation of pregnancy: Secondary | ICD-10-CM

## 2019-09-19 DIAGNOSIS — Z23 Encounter for immunization: Secondary | ICD-10-CM | POA: Diagnosis not present

## 2019-09-19 DIAGNOSIS — Z3490 Encounter for supervision of normal pregnancy, unspecified, unspecified trimester: Secondary | ICD-10-CM

## 2019-09-19 DIAGNOSIS — Z3493 Encounter for supervision of normal pregnancy, unspecified, third trimester: Secondary | ICD-10-CM

## 2019-09-19 NOTE — Patient Instructions (Addendum)
AREA PEDIATRIC/FAMILY PRACTICE PHYSICIANS  [place] ([address]) . [facility] o Chambliss, MD; Eniola, MD; Hale, MD; Hensel, MD; McDiarmid, MD; McIntyer, MD; Neal, MD; Walden, MD o [address] [place], [place] [address] o [phone] o Mon-Fri 8:30-12:30, 1:30-5:00 o Providers come to see babies at [facility] o Accepting Medicaid . [facility] o Limited providers who accept newborns: Koirala, MD; Morrow, MD; Wolters, MD o [address], [place], [place] [address] o [phone] o Mon-Fri 8:00-5:30 o Babies seen by providers at [facility] o Does NOT accept Medicaid o Please call early in hospitalization for appointment (limited availability)  . [facility] o Mulberry, MD o [address] [place], [place] [address] o [phone] o Mon, Tue, Thur, Fri 8:30-5:00, Wed 10:00-7:00 (closed 1-2pm) o Babies seen by [facility] providers o Accepting Medicaid . [facility] - Pediatrician o Rubin, MD o [address], [place], [place] [address] o [phone] o Mon-Fri 8:30-5:00, Sat 8:30-12:00 o Provider comes to see babies at [facility] o Accepting Medicaid o Must have been referred from current patients or contacted office prior to delivery . [facility] ([facility]) o Brown, MD; Chandler, MD; Ettefagh, MD; Grant, MD; Lester, MD; McCormick, MD; McQueen, MD; Prose, MD; Simha, MD; Stanley, MD; Stryffeler, NP; Tebben, NP o [address], [place], [place] [address] o [phone] o Mon, Tue, Thur, Fri 8:30-5:30, Wed 9:30-5:30, Sat 8:30-12:30 o Babies seen by [facility] providers o Accepting Medicaid o Only accepting infants of first-time parents or siblings of current patients o Hospital discharge coordinator will make follow-up appointment . Jack Amos o [address],  [place], [place]  [address] o [phone]   Fax - [phone] . [facility] o [address], [place], [place]  [address] o Phone - [phone]   Fax - [phone] . Shilpa Gosrani o [address], [place], [place]  [address] o [phone]  [place] ([address]) . [facility] o Bates, MD; Brassfield, MD; Cooper, Cox, MD; MD; Davis, MD; Dovico, MD; Ettefaugh, MD; Little, MD; Lowe, MD; Keiffer, MD; Melvin, MD; Sumner, MD; Williams, MD o [address], [place], [place] [address] o [phone] o Mon-Fri 8:30-5:00 (extended evenings Mon-Thur as needed), Sat-Sun 10:00-1:00 o Providers come to see babies at [facility] o Accepting Medicaid for families of first-time babies and families with all children in the household age 3 and under. Must register with office prior to making appointment (M-F only). . [facility] o Henson, NP; Knapp, MD; Lalonde, MD; Tysinger, PA o [address] [place], [place] [address] o [phone] o Mon-Fri 8:00-5:00 o Babies seen by providers at [facility] o Does NOT accept Medicaid/Commercial Insurance Only . [facility] ([facility]  o Artis, MD; Barnes, MD; Bratton, MD; Coccaro, MD; Lockett Gardner, MD; Kramer, MD; Marshall, MD; Netherton, MD; Poleto, MD; Skinner, MD o [address] [place], [place] [address] o [phone] o Mon-Fri 8:30-5:30, Sat (Oct.-Mar.) 9:00-1:00 o Babies seen by providers at [facility] o Accepting Medicaid  [place] ([address]) . [facility] o Reid, MD; Warner, MD o [address], [place],  [address] o [phone] o Mon-Fri 8:30-5:00, Sat 8:30-12:00 o Providers come to see babies at [facility] o Does NOT accept Medicaid . [facility] o Becker, PA; Hagler, MD; Scifres, PA; Sun, MD; Swayne, MD o [address],  [place], [place] [address] o [phone] o Mon-Fri 8:00-5:00 o Babies seen by providers at [facility] o Does NOT accept Medicaid o Only accepting babies of parents who are patients o Please call early in hospitalization for appointment (limited availability) . [facility] o Clark, MD; Frye, MD; Kelleher, MD; Mack, NP; Miller, MD; O'Keller, MD; Patterson, NP; Pudlo, MD; Puzio, MD; Thomas, MD; Tucker, MD; Twiselton, MD o [address], [place], [place] [address] o [phone] o Mon-Fri 8:00-5:00, Sat 9:00-12:00 o Providers come to see babies at [facility] o Does NOT accept Medicaid  [place] ([address]) . [facility] o Limited providers accepting new patients: Brake, NP; Wharton, PA o [address], [place], [place] [address] o [phone] o Mon-Fri 8:00-5:00 o Babies seen by providers at [facility] o Does NOT accept Medicaid o Only accepting babies of parents who are patients o Please call early in hospitalization for appointment (limited availability) . [facility] o Gay, MD; Quinlan, MD o [address] [place], [place] [address] o [phone] (press 1 to schedule appointment) o Mon-Fri 8:00-5:00 o Providers come to see babies at [facility] o Does NOT accept Medicaid . [facility] o Mazer, MD o [address] [place], [place] [address] o [phone] o Mon-Fri 8:30-5:00 (lunch 12:30-1:00), extended hours by appointment only Wed 5:00-6:30 o Babies seen by [facility] providers o Accepting Medicaid . [facility] o Banks, MD; Jordan, MD; Koberlein, MD o [address], [place], [place] [address] o [phone] o Mon-Fri 8:00-5:00 o Babies seen by [facility] providers o Does NOT accept Medicaid . [facility] o Parker, MD; Hunter, MD; Wallace, DO o [address] [place], [place]  [address] o [phone] o Mon-Fri 8:00-5:00 o Babies seen by [facility] providers o Does NOT accept Medicaid . [facility] o Brandon, PA; Brecken, PA; Christy, NP; Dees, MD; DeClaire, MD; DeWeese, MD; Hansen, NP; Mills, NP; Parrish, NP; Smoot, NP; Summer, MD; Vapne, MD o [address] [place], [place] [address] o [phone] o Mon-Fri 8:30-5:00, Sat 10:00-1:00 o Providers come to see babies at [facility] o Does NOT accept Medicaid o Free prenatal information session Tuesdays at 4:45pm . [facility] o Bouska, MD; Gordon, PA; Jeffery, PA; Weber, PA o [address] [place] [address] o [phone] o Mon-Fri 7:30-5:30 o Babies seen by [facility] providers . [facility] o [address], [place], [place], [place]  [address] o [phone]   Fax - [phone]  [place] ([phone]) . [facility] o Reese, MD o [address] [place], [place] [address] o [phone] o Mon-Thur 8:00-6:00 o Providers come to see babies at [facility] o Accepting Medicaid . [facility] o Anderson, NP; Badger, MD; Beal, PA; Spencer, PA o [address] [place],  [address] o [phone] o Mon-Thur 7:30-7:30, Fri 7:30-4:30 o Babies seen by [facility] providers o Accepting Medicaid . [facility] o Agbuya, MD; Klett, NP; Romgoolam, MD o [address], ,  [address] o [phone] o Mon-Fri 8:30-5:00, Sat 8:30-12:00 o Providers come to see babies at [facility] o Accepting Medicaid o Must have "Meet & Greet" appointment at office prior to delivery . [facility]  ([facility] ) o McCord,   MD; Juleen China, MD; Clydene Laming, [address], [place], [place] [address] o [phone] o Mon-Wed 8:00-6:00, Thur-Fri 8:00-5:00, Sat 9:00-12:00 o Providers come to  see babies at [facility] o Does NOT accept Medicaid o Only accepting siblings of current patients . [facility] [place], [place], [place], [place]  [address] o [phone]   Fax [phone] . [facility] [address], [place], [place]  [address] o [phone]   Fax - Morton Gorman [phone] & [phone]) . Therapist, music at [place], DO; [place], [address] [place], [place] [address] o [phone] o Mon-Fri 7:00-5:00 o Babies seen by [facility] providers o Does NOT accept Medicaid . [place], MD; [place], [place]; [place], [place], [place], [place] [address] o [phone] o Mon-Fri 8:00-5:00 o Babies seen by [facility] providers o Accepting Medicaid . Lamont, MD; [place], [place]; [place], NP; [place], [place], [place], [place] [address] o [phone] o Mon-Fri 8:00-5:00 o Babies seen by providers at [facility] [place] [phone]) . [facility] [place], [place] o [address] [place], [place] [address] o [phone] o Mon-Fri 8:00-5:00 o Babies seen by [facility] providers o Does NOT accept Medicaid o Limited availability, please call early in hospitalization to schedule follow-up . [facility] Leilani Merl, PA; Maisie Fus, MD; [place], MD; [place], [place]; Jeannine Kitten, MD; [place], [place] [facility] [address], [place], [place] [address] o [phone] o Mon-Fri 8:30-5:00, Sat 9:00-12:00 o Babies seen by providers at [facility] o Accepting Medicaid o Please register online then schedule online or call office o [email or web address] . [place] [facility]  [place]) Kristian Covey, NP; Dwyane Dee, MD; Leonidas Romberg, PA o [address] [place], [place], [place] [address] o [phone] o Mon-Fri 8:00-5:00 o Babies seen by providers at [facility] o Accepting Medicaid . [place] ([facility]) Dairl Ponder, MD; Rayvon Char, NP; Melina Modena, MD o [address] [place], [place], [place] [address] o [phone] o Mon-Fri 8:00-5:30, Sat&Sun by appointment (phones open at 8:30) o Babies seen by [facility] providers o Accepting Medicaid o Must be a first-time baby or sibling of current patient . [address], [address], [place], [place]  [address] o [phone]   Fax - [phone]  [place] [phone] & [phone]) . [place], [place]; [place], [place]; Benjamine Mola, MD; [place], [place]; Harrell Lark, MD o [address] [place], [place] [address] o [phone] o Mon-Thur 8:00-7:00, Fri 8:00-5:00, Sat 8:00-12:00, Sun 9:00-12:00 o Babies seen by [facility] providers o Accepting Medicaid . [facility], MD; Ruthann Cancer, MD; [facility], MD o [address], [place], [place] [address] o [phone] o Mon-Thur 8:00-5:00 o Babies seen by providers at [facility] o Accepting Medicaid . [facility] [place], MD; Coe-Goins, MD; Amedeo Plenty, MD; Bobby Rumpf, MD; List, MD; Lavonia Drafts, MD; Ruthann Cancer, MD; Selinda Eon, MD; Audie Box, MD; Jim Like, MD; Christie Nottingham, MD; Hubbard Hartshorn, MD; Modena Nunnery, MD o [address] [place], [place]  [address] o [phone] o Mon-Fri 8:00-5:30, Sat (Oct.-Mar.) 9:00-1:00 o Babies seen by providers at [facility] o Accepting Medicaid o Must fill out new patient packet, available online at [email or web address] . [facility] ([facility] o Friddle, NP; Harris, NP; Kelly, NP; Logan, MD;  Melvin, PA; Poth, MD; Ramadoss, MD; Stanton, NP o [address], [place], [place] [address] o [phone] o Mon-Thur 8:00-5:30, Fri 8:00-5:00 o Babies seen by providers at [facility] o Accepting Medicaid  [place] ([address]) . [facility] o Dixon, PA; [place], MD; Pickard, MD; Tapia, PA o [address], [place], [place] [address] o [phone] o Mon-Fri 8:00-5:00 o Babies seen by providers at [facility] o Accepting Medicaid   [place] ([address]) . [facility] [place] o Masneri, DO; Meyers, MD; Nelson, PA o [address], [place], [place] [address] o [phone] o Mon-Fri 8:00-5:00 o Babies seen by providers at [facility] o Does NOT accept Medicaid o Limited appointment availability, please call early in hospitalization  . [facility] [place] o Kunedd, DO; McGowen, MD o [address], [place], [place] [address] o [phone] o Mon-Fri 8:00-5:00 o Babies seen by [facility] providers o Does NOT accept Medicaid . [facility] [place] o Cameron, MD; MacDonald, MD; Michaels, PA; Nayak, MD o [address] [place] [address], [place], [place] [address] o [phone] o Mon-Fri 8:00-5:00 o After hours clinic ([address] [place], [place] [address]) [phone] Mon-Fri 5:00-8:00, Sat 12:00-6:00, Sun 10:00-4:00 o Babies seen by [facility] providers o Accepting Medicaid . [facility] [place] o [address], [place], [place]  [address] o [phone]   Fax - [phone]  [place] ([address]) . [facility] [place] [facility] o Andy, MD o [address], [place], [place] [address] o [phone] o Mon-Fri 8:00-5:00 o Babies seen by [facility] providers o Does NOT accept Medicaid . [facility] [place] ([facility] [place]) o Eksir, MD o [address], [place], [place]  [address] o [phone] o Mon-Thur 8:00-7:00, Fri 8:00-5:00, Sat 8:00-12:00 o Babies seen by providers at [facility] o Accepting Medicaid - but does not have vaccinations in office (must be received elsewhere) o Limited availability, please call early in hospitalization  [place] ([address]) . [facility]  o Charlene Flemming, MD o [address], [place] [address] o [phone]  Fax [phone] Places to have your son circumcised:                                                                      [facility]     [phone]   $480 while you are in hospital         [facility]              [phone]   $269 by 4 wks                      [facility]                     [phone]   $  269 by 7 days [facility]                    [phone]   $269 by 4 wks [facility]             [phone]   $225 by 2 wks    These prices sometimes change but are roughly what you can expect to pay. Please call and confirm pricing.   Circumcision is considered an elective/non-medically necessary procedure. There are many reasons parents decide to have their sons circumsized. During the first year of life circumcised males have a reduced risk of urinary tract infections but after this year the rates between circumcised males and uncircumcised males are the same.  It is safe to have your son circumcised outside of the hospital and the places above perform them regularly.   Deciding about Circumcision in Baby Boys  (Up-to-date The Basics)  What is circumcision?   Circumcision is a surgery that removes the skin that covers the tip of the penis, called the "foreskin" Circumcision is usually done when a boy is between 1 and 10 days old. In the [place], circumcision is common. In some other countries, fewer boys are circumcised. Circumcision is a common tradition in some religions.  Should I have my baby boy circumcised?   There is no easy answer. Circumcision has some benefits. But it also has risks.  After talking with your doctor, you will have to decide for yourself what is right for your family.  What are the benefits of circumcision?   Circumcised boys seem to have slightly lower rates of: ?Urinary tract infections ?Swelling of the opening at the tip of the penis Circumcised men seem to have slightly lower rates of: ?Urinary tract infections ?Swelling of the opening at the tip of the penis ?Penis cancer ?HIV and other infections that you catch during sex ?Cervical cancer in the women they have sex with Even so, in the [place], the risks of these problems are small - even in boys and men who have not been circumcised. Plus, boys and men who are not circumcised can reduce these extra risks by: ?Cleaning their penis well ?Using condoms during sex  What are the risks of circumcision?  Risks include: ?Bleeding or infection from the surgery ?Damage to or amputation of the penis ?A chance that the doctor will cut off too much or not enough of the foreskin ?A chance that sex won't feel as good later in life Only about 1 out of every 200 circumcisions leads to problems. There is also a chance that your health insurance won't pay for circumcision.  How is circumcision done in baby boys?  First, the baby gets medicine for pain relief. This might be a cream on the skin or a shot into the base of the penis. Next, the doctor cleans the baby's penis well. Then he or she uses special tools to cut off the foreskin. Finally, the doctor wraps a bandage (called gauze) around the baby's penis. If you have your baby circumcised, his doctor or nurse will give you instructions on how to care for him after the surgery. It is important that you follow those instructions carefully.  

## 2019-09-19 NOTE — Progress Notes (Signed)
   PRENATAL VISIT NOTE  Subjective:  Denise Barker is a 33 y.o. G1P0 at 109w4d being seen today for ongoing prenatal care.  She is currently monitored for the following issues for this low-risk pregnancy and has Asthma; Anxiety; Depression affecting pregnancy; Supervision of low-risk pregnancy; Obesity in pregnancy; and Nausea and vomiting in pregnancy on their problem list.  Patient reports no complaints.  Contractions: Not present. Vag. Bleeding: None.  Movement: Present. Denies leaking of fluid.   The following portions of the patient's history were reviewed and updated as appropriate: allergies, current medications, past family history, past medical history, past social history, past surgical history and problem list.   Objective:   Vitals:   09/19/19 0833 09/19/19 1046  BP: (!) 147/91 (!) 129/92  Pulse: 100 (!) 106  Weight: (!) 306 lb 14.4 oz (139.2 kg)     Fetal Status: Fetal Heart Rate (bpm): 154   Movement: Present     General:  Alert, oriented and cooperative. Patient is in no acute distress.  Skin: Skin is warm and dry. No rash noted.   Cardiovascular: Normal heart rate noted  Respiratory: Normal respiratory effort, no problems with respiration noted  Abdomen: Soft, gravid, appropriate for gestational age.  Pain/Pressure: Absent     Pelvic: Cervical exam deferred        Extremities: Normal range of motion.  Edema: None  Mental Status: Normal mood and affect. Normal behavior. Normal judgment and thought content.   Assessment and Plan:  Pregnancy: G1P0 at [redacted]w[redacted]d 1. Encounter for supervision of low-risk pregnancy in third trimester - routine care - 2 hour GTT and 28 week labs today  - Has BP cuff  2. BP elevated today - ? Chronic hypertension. 132/85 at NOB and 138/76 in babyscripts on 07/02/2019.  - 147/91 and 129/92 on follow up today  - Patient denies any symptoms at this time  - Pre-eclampsia labs today - FU BP check in one week   Preterm labor symptoms and general  obstetric precautions including but not limited to vaginal bleeding, contractions, leaking of fluid and fetal movement were reviewed in detail with the patient. Please refer to After Visit Summary for other counseling recommendations.   Return in about 2 weeks (around 10/03/2019) for virtual visit .  Future Appointments  Date Time Provider [place]  09/26/2019 10:20 AM [place]  09/28/2019 11:15 AM [place] 4 WH-MFCUS MFC-US  09/28/2019 11:20 AM WH-MFC NURSE WH-MFC MFC-US  10/03/2019 11:15 AM [place], Mervyn Skeeters, CNM [facility] [place], CNM  09/19/19  10:51 AM

## 2019-09-20 ENCOUNTER — Telehealth: Payer: Self-pay

## 2019-09-20 ENCOUNTER — Other Ambulatory Visit: Payer: Self-pay | Admitting: Advanced Practice Midwife

## 2019-09-20 ENCOUNTER — Telehealth: Payer: Self-pay | Admitting: Lactation Services

## 2019-09-20 DIAGNOSIS — O24419 Gestational diabetes mellitus in pregnancy, unspecified control: Secondary | ICD-10-CM | POA: Insufficient documentation

## 2019-09-20 DIAGNOSIS — Z3493 Encounter for supervision of normal pregnancy, unspecified, third trimester: Secondary | ICD-10-CM

## 2019-09-20 LAB — COMPREHENSIVE METABOLIC PANEL
ALT: 9 IU/L (ref 0–32)
AST: 11 IU/L (ref 0–40)
Albumin/Globulin Ratio: 1.3 (ref 1.2–2.2)
Albumin: 3.4 g/dL — ABNORMAL LOW (ref 3.8–4.8)
Alkaline Phosphatase: 100 IU/L (ref 39–117)
BUN/Creatinine Ratio: 11 (ref 9–23)
BUN: 6 mg/dL (ref 6–20)
Bilirubin Total: 0.2 mg/dL (ref 0.0–1.2)
CO2: 17 mmol/L — ABNORMAL LOW (ref 20–29)
Calcium: 9.2 mg/dL (ref 8.7–10.2)
Chloride: 103 mmol/L (ref 96–106)
Creatinine, Ser: 0.53 mg/dL — ABNORMAL LOW (ref 0.57–1.00)
GFR calc Af Amer: 146 mL/min/{1.73_m2} (ref >59)
GFR calc non Af Amer: 127 mL/min/{1.73_m2} (ref >59)
Globulin, Total: 2.7 g/dL (ref 1.5–4.5)
Glucose: 194 mg/dL — ABNORMAL HIGH (ref 65–99)
Potassium: 3.8 mmol/L (ref 3.5–5.2)
Sodium: 135 mmol/L (ref 134–144)
Total Protein: 6.1 g/dL (ref 6.0–8.5)

## 2019-09-20 LAB — CBC
Hematocrit: 33.8 % — ABNORMAL LOW (ref 34.0–46.6)
Hemoglobin: 11.4 g/dL (ref 11.1–15.9)
MCH: 29.9 pg (ref 26.6–33.0)
MCHC: 33.7 g/dL (ref 31.5–35.7)
MCV: 89 fL (ref 79–97)
Platelets: 403 10*3/uL (ref 150–450)
RBC: 3.81 x10E6/uL (ref 3.77–5.28)
RDW: 13 % (ref 11.7–15.4)
WBC: 9.1 10*3/uL (ref 3.4–10.8)

## 2019-09-20 LAB — PROTEIN / CREATININE RATIO, URINE
Creatinine, Urine: 227 mg/dL
Protein, Ur: 26.6 mg/dL
Protein/Creat Ratio: 117 mg/g creat (ref 0–200)

## 2019-09-20 LAB — GLUCOSE TOLERANCE, 2 HOURS W/ 1HR
Glucose, 1 hour: 194 mg/dL — ABNORMAL HIGH (ref 65–179)
Glucose, 2 hour: 114 mg/dL (ref 65–152)
Glucose, Fasting: 106 mg/dL — ABNORMAL HIGH (ref 65–91)

## 2019-09-20 LAB — RPR: RPR Ser Ql: NONREACTIVE

## 2019-09-20 LAB — HIV ANTIBODY (ROUTINE TESTING W REFLEX): HIV Screen 4th Generation wRfx: NONREACTIVE

## 2019-09-20 NOTE — Telephone Encounter (Signed)
Attempted to call pt to inform her of GDM status and need to follow up with Diabetes Educator. LM for pt to call the office for results. Message to front office to call pt and schedule with Diabetes Ed.

## 2019-09-20 NOTE — Telephone Encounter (Signed)
Pt has Diabetes Ed appt scheduled for 12/17, needs to be notified. Was not able to reach pt this morning.

## 2019-09-20 NOTE — Telephone Encounter (Signed)
-----   Message from Tresea Mall, CNM sent at 09/20/2019  8:23 AM EST ----- Patient has GDM. She needs to be scheduled with the diabetes educator.

## 2019-09-20 NOTE — Telephone Encounter (Signed)
Called pt to advise of New appointments with Dietician & new ob appts. No answer & could not leave a message. Will send My Chart message.

## 2019-09-20 NOTE — Telephone Encounter (Signed)
-----   Message from Rada Hay sent at 09/20/2019  8:55 AM EST ----- Please give patient GDM education appointment and updates OB appointment. She was originally scheduled with an app. Now scheduled w/ MD  ----- Message ----- From: Tresea Mall, CNM Sent: 09/20/2019   8:23 AM EST To: [facility] Clinical Pool, [facility] Admin Pool  Patient has GDM. She needs to be scheduled with the diabetes educator.

## 2019-09-26 ENCOUNTER — Encounter: Payer: Self-pay | Admitting: *Deleted

## 2019-09-26 ENCOUNTER — Ambulatory Visit (HOSPITAL_COMMUNITY)
Admission: RE | Admit: 2019-09-26 | Discharge: 2019-09-26 | Disposition: A | Discharge status: Home / Self Care | Payer: 59 | Source: Ambulatory Visit | Attending: Obstetrics and Gynecology | Admitting: Obstetrics and Gynecology

## 2019-09-26 ENCOUNTER — Ambulatory Visit (HOSPITAL_COMMUNITY): Payer: 59 | Admitting: *Deleted

## 2019-09-26 ENCOUNTER — Other Ambulatory Visit (HOSPITAL_COMMUNITY): Payer: Self-pay | Admitting: *Deleted

## 2019-09-26 ENCOUNTER — Encounter (HOSPITAL_COMMUNITY): Payer: Self-pay

## 2019-09-26 ENCOUNTER — Ambulatory Visit: Payer: 59

## 2019-09-26 ENCOUNTER — Other Ambulatory Visit: Payer: Self-pay

## 2019-09-26 DIAGNOSIS — Z362 Encounter for other antenatal screening follow-up: Secondary | ICD-10-CM | POA: Diagnosis not present

## 2019-09-26 DIAGNOSIS — F329 Major depressive disorder, single episode, unspecified: Secondary | ICD-10-CM | POA: Insufficient documentation

## 2019-09-26 DIAGNOSIS — O99340 Other mental disorders complicating pregnancy, unspecified trimester: Secondary | ICD-10-CM | POA: Diagnosis not present

## 2019-09-26 DIAGNOSIS — O219 Vomiting of pregnancy, unspecified: Secondary | ICD-10-CM | POA: Insufficient documentation

## 2019-09-26 DIAGNOSIS — Z3A29 29 weeks gestation of pregnancy: Secondary | ICD-10-CM

## 2019-09-26 DIAGNOSIS — O10013 Pre-existing essential hypertension complicating pregnancy, third trimester: Secondary | ICD-10-CM

## 2019-09-26 DIAGNOSIS — F419 Anxiety disorder, unspecified: Secondary | ICD-10-CM | POA: Insufficient documentation

## 2019-09-26 DIAGNOSIS — J45909 Unspecified asthma, uncomplicated: Secondary | ICD-10-CM | POA: Diagnosis not present

## 2019-09-26 DIAGNOSIS — O359XX0 Maternal care for (suspected) fetal abnormality and damage, unspecified, not applicable or unspecified: Secondary | ICD-10-CM | POA: Diagnosis not present

## 2019-09-26 DIAGNOSIS — F32A Depression, unspecified: Secondary | ICD-10-CM

## 2019-09-26 DIAGNOSIS — O99213 Obesity complicating pregnancy, third trimester: Secondary | ICD-10-CM

## 2019-09-26 DIAGNOSIS — O99210 Obesity complicating pregnancy, unspecified trimester: Secondary | ICD-10-CM | POA: Diagnosis not present

## 2019-09-26 DIAGNOSIS — O24419 Gestational diabetes mellitus in pregnancy, unspecified control: Secondary | ICD-10-CM

## 2019-09-26 DIAGNOSIS — O99891 Other specified diseases and conditions complicating pregnancy: Secondary | ICD-10-CM | POA: Diagnosis not present

## 2019-09-26 NOTE — Progress Notes (Unsigned)
Patient was scheduled for a nurse visit in our office today for blood pressure check.  MFM asked the patient to come in today to have her ultrasound today instead of later in the week.  The MFM nurse checked the patient's blood pressure and obtained a reading of 130/89.  The nurse visit appointment in our office was cancelled since the MFM nurse was agreeable to checking patient's blood pressure.

## 2019-09-28 ENCOUNTER — Ambulatory Visit (HOSPITAL_COMMUNITY): Payer: 59

## 2019-10-02 ENCOUNTER — Encounter (HOSPITAL_COMMUNITY): Payer: Self-pay | Admitting: Obstetrics & Gynecology

## 2019-10-02 ENCOUNTER — Inpatient Hospital Stay
Admission: RE | Admit: 2019-10-02 | Discharge: 2019-10-02 | Disposition: A | Discharge status: Home / Self Care | Payer: 59 | Source: Ambulatory Visit

## 2019-10-02 ENCOUNTER — Other Ambulatory Visit: Payer: Self-pay

## 2019-10-02 ENCOUNTER — Telehealth: Payer: Self-pay

## 2019-10-02 ENCOUNTER — Inpatient Hospital Stay (HOSPITAL_COMMUNITY)
Admission: AD | Admit: 2019-10-02 | Discharge: 2019-10-02 | Disposition: A | Discharge status: Home / Self Care | Payer: 59 | Attending: Obstetrics & Gynecology | Admitting: Obstetrics & Gynecology

## 2019-10-02 DIAGNOSIS — Z8249 Family history of ischemic heart disease and other diseases of the circulatory system: Secondary | ICD-10-CM | POA: Diagnosis not present

## 2019-10-02 DIAGNOSIS — Z79899 Other long term (current) drug therapy: Secondary | ICD-10-CM | POA: Insufficient documentation

## 2019-10-02 DIAGNOSIS — R102 Pelvic and perineal pain: Secondary | ICD-10-CM | POA: Diagnosis not present

## 2019-10-02 DIAGNOSIS — O99343 Other mental disorders complicating pregnancy, third trimester: Secondary | ICD-10-CM | POA: Insufficient documentation

## 2019-10-02 DIAGNOSIS — R109 Unspecified abdominal pain: Secondary | ICD-10-CM | POA: Diagnosis not present

## 2019-10-02 DIAGNOSIS — F0390 Unspecified dementia without behavioral disturbance: Secondary | ICD-10-CM | POA: Diagnosis not present

## 2019-10-02 DIAGNOSIS — O163 Unspecified maternal hypertension, third trimester: Secondary | ICD-10-CM | POA: Insufficient documentation

## 2019-10-02 DIAGNOSIS — O24419 Gestational diabetes mellitus in pregnancy, unspecified control: Secondary | ICD-10-CM | POA: Insufficient documentation

## 2019-10-02 DIAGNOSIS — O99891 Other specified diseases and conditions complicating pregnancy: Secondary | ICD-10-CM | POA: Diagnosis not present

## 2019-10-02 DIAGNOSIS — Z3A30 30 weeks gestation of pregnancy: Secondary | ICD-10-CM | POA: Diagnosis not present

## 2019-10-02 DIAGNOSIS — M549 Dorsalgia, unspecified: Secondary | ICD-10-CM

## 2019-10-02 DIAGNOSIS — O99893 Other specified diseases and conditions complicating puerperium: Secondary | ICD-10-CM

## 2019-10-02 DIAGNOSIS — O1414 Severe pre-eclampsia complicating childbirth: Secondary | ICD-10-CM

## 2019-10-02 DIAGNOSIS — O26893 Other specified pregnancy related conditions, third trimester: Secondary | ICD-10-CM | POA: Diagnosis not present

## 2019-10-02 DIAGNOSIS — O139 Gestational [pregnancy-induced] hypertension without significant proteinuria, unspecified trimester: Secondary | ICD-10-CM

## 2019-10-02 DIAGNOSIS — Z818 Family history of other mental and behavioral disorders: Secondary | ICD-10-CM | POA: Insufficient documentation

## 2019-10-02 DIAGNOSIS — R519 Headache, unspecified: Secondary | ICD-10-CM

## 2019-10-02 LAB — COMPREHENSIVE METABOLIC PANEL
ALT: 13 U/L (ref 0–44)
AST: 15 U/L (ref 15–41)
Albumin: 2.6 g/dL — ABNORMAL LOW (ref 3.5–5.0)
Alkaline Phosphatase: 88 U/L (ref 38–126)
Anion gap: 8 (ref 5–15)
BUN: <5 mg/dL — ABNORMAL LOW (ref 6–20)
CO2: 21 mmol/L — ABNORMAL LOW (ref 22–32)
Calcium: 8.9 mg/dL (ref 8.9–10.3)
Chloride: 108 mmol/L (ref 98–111)
Creatinine, Ser: 0.58 mg/dL (ref 0.44–1.00)
GFR calc Af Amer: >60 mL/min (ref >60)
GFR calc non Af Amer: >60 mL/min (ref >60)
Glucose, Bld: 108 mg/dL — ABNORMAL HIGH (ref 70–99)
Potassium: 3.9 mmol/L (ref 3.5–5.1)
Sodium: 137 mmol/L (ref 135–145)
Total Bilirubin: 0.1 mg/dL — ABNORMAL LOW (ref 0.3–1.2)
Total Protein: 6.1 g/dL — ABNORMAL LOW (ref 6.5–8.1)

## 2019-10-02 LAB — URINALYSIS, ROUTINE W REFLEX MICROSCOPIC
Bilirubin Urine: NEGATIVE
Glucose, UA: 50 mg/dL — AB
Hgb urine dipstick: NEGATIVE
Ketones, ur: NEGATIVE mg/dL
Leukocytes,Ua: NEGATIVE
Nitrite: NEGATIVE
Protein, ur: NEGATIVE mg/dL
Specific Gravity, Urine: 1.014 (ref 1.005–1.030)
pH: 6 (ref 5.0–8.0)

## 2019-10-02 LAB — CBC
HCT: 32.1 % — ABNORMAL LOW (ref 36.0–46.0)
Hemoglobin: 11 g/dL — ABNORMAL LOW (ref 12.0–15.0)
MCH: 30.3 pg (ref 26.0–34.0)
MCHC: 34.3 g/dL (ref 30.0–36.0)
MCV: 88.4 fL (ref 80.0–100.0)
Platelets: 395 10*3/uL (ref 150–400)
RBC: 3.63 MIL/uL — ABNORMAL LOW (ref 3.87–5.11)
RDW: 13 % (ref 11.5–15.5)
WBC: 8.2 10*3/uL (ref 4.0–10.5)
nRBC: 0 % (ref 0.0–0.2)

## 2019-10-02 LAB — PROTEIN / CREATININE RATIO, URINE
Creatinine, Urine: 106.63 mg/dL
Protein Creatinine Ratio: 0.12 mg/mg{Cre} (ref 0.00–0.15)
Total Protein, Urine: 13 mg/dL

## 2019-10-02 MED ORDER — CYCLOBENZAPRINE HCL 10 MG PO TABS: 10.0000 mg | ORAL_TABLET | Freq: Three times a day (TID) | ORAL | 0 refills | Status: DC | PRN

## 2019-10-02 MED ORDER — ACETAMINOPHEN 500 MG PO TABS
1000.0000 mg | ORAL_TABLET | Freq: Once | ORAL | Status: AC
  Administered 2019-10-02: 1000 mg via ORAL
  Filled 2019-10-02: qty 2

## 2019-10-02 MED ORDER — COMFORT FIT MATERNITY SUPP LG MISC: 1.0000 | Freq: Every day | 0 refills | Status: DC | PRN

## 2019-10-02 NOTE — Telephone Encounter (Signed)
Pt called and stated that she had to call out of work because she was having severe pain in her vagina and lower back.  Pt reports that it is hard for her to move around.  Notified Dr. Hulan Fray who recommends that the pt go to [facility]U for evaluation.  Called pt and pt informed me the above.  Notified pt to go to MAU for evaluation.  Pt verbalized understanding.

## 2019-10-02 NOTE — MAU Provider Note (Addendum)
Chief Complaint:  Back Pain and Vaginal Pain   First Provider Initiated Contact with Patient 10/02/19 1123      HPI: Denise Barker is a 33 y.o. G1P0 at 67w3dby LMP who presents to maternity admissions reporting low back and vaginal pain. The pains in both areas are described as similar, ongoing dull/aching pain that began~ 2 weeks ago. The pain is not positional and is especially worsened with prolonged sitting and walking. She reports she has been forced to have a waddling gait. The vaginal pain is external to the vagina. She has not been able to see if there is any swelling or erythema. The low back is more pronounced on the left side. Patient has tried Tylenol, and alternating heat/ice however neither have helped.   Patient also reports visual disturbances and headache. Her visual symptoms occur at night in which she has blurred vision and has difficulty reading road signs. She describes a hue of illumination surrounding her areas of focus. Her headache occurs every few days and is also typically at night. She states that it is severe and sometimes prevents her from sleeping.   Otherwise, she reports good fetal movement, denies contractions, LOF, vaginal bleeding, vaginal itching/burning, urinary symptoms, dizziness, n/v, or fever/chills.    Past Medical History: Past Medical History:  Diagnosis Date  . Anxiety   . Dementia ([place]   . Gestational diabetes     Past obstetric history: OB History  Gravida Para Term Preterm AB Living  1            SAB TAB Ectopic Multiple Live Births               # Outcome Date GA Lbr Len/2nd Weight Sex Delivery Anes PTL Lv  1 Current             Past Surgical History: Past Surgical History:  Procedure Laterality Date  . NO PAST SURGERIES      Family History: Family History  Problem Relation Age of Onset  . Depression Mother   . Anxiety disorder Mother   . Hypertension Mother   . Depression Father   . Anxiety disorder Father   . Bronchitis  Father     Social History: Social History   Tobacco Use  . Smoking status: Never Smoker  . Smokeless tobacco: Never Used  Substance Use Topics  . Alcohol use: Not Currently    Comment: occasionally , not during pregnancy  . Drug use: Not Currently    Types: Marijuana    Comment: Last used late May    Allergies: No Known Allergies  Meds:  Medications Prior to Admission  Medication Sig Dispense Refill Last Dose  . albuterol (VENTOLIN HFA) 108 (90 Base) MCG/ACT inhaler Inhale 1 puff into the lungs every 6 (six) hours as needed for wheezing or shortness of breath.   Past Month at Unknown time  . aspirin EC 81 MG tablet Take 1 tablet (81 mg total) by mouth daily. 30 tablet 4 10/02/2019 at Unknown time  . Blood Pressure Monitoring (BLOOD PRESSURE KIT) DEVI 1 Device by Does not apply route as needed. ICD 10 :  Z34.90 1 Device 0 10/02/2019 at Unknown time  . Prenatal 27-1 MG TABS Take 1 tablet by mouth daily. 30 tablet 9 Past Month at Unknown time  . sertraline (ZOLOFT) 50 MG tablet Take 1 tablet (50 mg total) by mouth daily. 90 tablet 1 10/02/2019 at Unknown time  . Elastic Bandages & Supports (COMFORT FIT MATERNITY  SUPP LG) MISC 1 each by Does not apply route daily at 8 pm. (Patient not taking: Reported on 07/06/2019) 1 each 0 Unknown at Unknown time  . Magnesium 200 MG TABS Take 2 tablets by mouth daily.     . metoCLOPramide (REGLAN) 10 MG tablet Take 1 tablet (10 mg total) by mouth 4 (four) times daily -  before meals and at bedtime. (Patient not taking: Reported on 08/22/2019) 90 tablet 1   . Nutritional Supplements (ENSURE ACTIVE HIGH PROTEIN) LIQD Take 1 Bottle by mouth daily at 12 noon. (Patient not taking: Reported on 08/22/2019) 237 mL 10   . ondansetron (ZOFRAN ODT) 8 MG disintegrating tablet Take 1 tablet (8 mg total) by mouth every 8 (eight) hours as needed for nausea or vomiting. (Patient not taking: Reported on 08/22/2019) 20 tablet 0   . Prenatal Vit-Fe Fumarate-FA (PRENATAL  VITAMINS PO) Take 1 tablet by mouth daily.       ROS:  Negative other than per HPI  I have reviewed patient's Past Medical Hx, Surgical Hx, Family Hx, Social Hx, medications and allergies.   Physical Exam   Patient Vitals for the past 24 hrs:  BP Temp Pulse Resp SpO2  10/02/19 1230 (!) 142/93 - 94 - -  10/02/19 1215 (!) 142/89 - 96 - -  10/02/19 1200 (!) 144/91 - 93 - -  10/02/19 1145 (!) 143/94 - 98 - -  10/02/19 1130 (!) 148/94 - (!) 101 - -  10/02/19 1115 (!) 148/92 - 94 - -  10/02/19 1105 (!) 140/94 97.6 F (36.4 C) 94 16 99 %   Constitutional: Well-developed, well-nourished female in no acute distress.  Cardiovascular: normal rate Respiratory: normal effort GI: Abd soft, non-tender, gravid appropriate for gestational age.  MS: Extremities nontender, no edema, normal ROM Neurologic: Alert and oriented x 4.   Pelvic Exam: normal externa genitalia, no discharge/exudates/lesions, cervix is closed/thick/high, no evidence of ROM or low station, no varicosities of pregnancy observed  NST -baseline: 150 -variability: moderate -accels: none -decels: present, mild variables -interpretation: Cat 2 tracing    Labs: Results for orders placed or performed during the hospital encounter of 10/02/19 (from the past 24 hour(s))  Urinalysis, Routine w reflex microscopic     Status: Abnormal   Collection Time: 10/02/19 11:02 AM  Result Value Ref Range   Color, Urine YELLOW YELLOW   APPearance CLEAR CLEAR   Specific Gravity, Urine 1.014 1.005 - 1.030   pH 6.0 5.0 - 8.0   Glucose, UA 50 (A) NEGATIVE mg/dL   Hgb urine dipstick NEGATIVE NEGATIVE   Bilirubin Urine NEGATIVE NEGATIVE   Ketones, ur NEGATIVE NEGATIVE mg/dL   Protein, ur NEGATIVE NEGATIVE mg/dL   Nitrite NEGATIVE NEGATIVE   Leukocytes,Ua NEGATIVE NEGATIVE  Protein / creatinine ratio, urine     Status: None   Collection Time: 10/02/19 12:09 PM  Result Value Ref Range   Creatinine, Urine 106.63 mg/dL   Total Protein,  Urine 13 mg/dL   Protein Creatinine Ratio 0.12 0.00 - 0.15 mg/mg[Cre]  CBC     Status: Abnormal   Collection Time: 10/02/19 12:13 PM  Result Value Ref Range   WBC 8.2 4.0 - 10.5 K/uL   RBC 3.63 (L) 3.87 - 5.11 MIL/uL   Hemoglobin 11.0 (L) 12.0 - 15.0 g/dL   HCT 32.1 (L) 36.0 - 46.0 %   MCV 88.4 80.0 - 100.0 fL   MCH 30.3 26.0 - 34.0 pg   MCHC 34.3 30.0 - 36.0 g/dL  RDW 13.0 11.5 - 15.5 %   Platelets 395 150 - 400 K/uL   nRBC 0.0 0.0 - 0.2 %  Comprehensive metabolic panel     Status: Abnormal   Collection Time: 10/02/19 12:13 PM  Result Value Ref Range   Sodium 137 135 - 145 mmol/L   Potassium 3.9 3.5 - 5.1 mmol/L   Chloride 108 98 - 111 mmol/L   CO2 21 (L) 22 - 32 mmol/L   Glucose, Bld 108 (H) 70 - 99 mg/dL   BUN <5 (L) 6 - 20 mg/dL   Creatinine, Ser 0.58 0.44 - 1.00 mg/dL   Calcium 8.9 8.9 - 10.3 mg/dL   Total Protein 6.1 (L) 6.5 - 8.1 g/dL   Albumin 2.6 (L) 3.5 - 5.0 g/dL   AST 15 15 - 41 U/L   ALT 13 0 - 44 U/L   Alkaline Phosphatase 88 38 - 126 U/L   Total Bilirubin 0.1 (L) 0.3 - 1.2 mg/dL   GFR calc non Af Amer >60 >60 mL/min   GFR calc Af Amer >60 >60 mL/min   Anion gap 8 5 - 15   A/Positive/-- (08/13 3428)  Imaging:  none  Orders Placed This Encounter  Procedures  . Urinalysis, Routine w reflex microscopic  . CBC  . Comprehensive metabolic panel  . Protein / creatinine ratio, urine  . Measure blood pressure  . Discharge patient    Meds ordered this encounter  Medications  . acetaminophen (TYLENOL) tablet 1,000 mg  . cyclobenzaprine (FLEXERIL) 10 MG tablet    Sig: Take 1 tablet (10 mg total) by mouth 3 (three) times daily as needed for muscle spasms.    Dispense:  30 tablet    Refill:  0    Order Specific Question:   Supervising Provider    Answer:   [place] Mode [[id number]]  . DISCONTD: Elastic Bandages & Supports (COMFORT FIT MATERNITY SUPP LG) MISC    Sig: 1 each by Does not apply route daily as needed.    Dispense:  1 each    Refill:  0    Order  Specific Question:   Supervising Provider    Answer:   [place] Mode [[id number]]  . Elastic Bandages & Supports (COMFORT FIT MATERNITY SUPP LG) MISC    Sig: 1 each by Does not apply route daily as needed.    Dispense:  1 each    Refill:  0    Order Specific Question:   Supervising Provider    Answer:   [place] Mode [[id number]]     Assessment/Plan: Patient is a G1P0 at 6w3dby LMP who presents to maternity admissions reporting low back/vaginal pain suggestive of low back and pelvic pain of pregnancy.  Patient was also noted to have elevated BP's (140/94) on presentation with  reported hx of headache and visual changes; she has no visual changes currently. The visual changes have occurred only at night.   Pelvic/Low Back and Vaginal Pain Patient presenting with complaint of low back and external vaginal pain. Pain is achy/dull, non-positional, and worsened by prolonged sitting/walking. Weight >300 lbs. Suggestive of pelvic/low back pain associated with pregnancy. Has not tried maternity support belt.  --maternity support belt --counseled patient to take Tylenol 1000 mg for pain --Flexeril at night RX sent.  --counseled/educated patient on low back pain associated of pregnancy, advised heat/yoga/PT could all be useful   HTN with hx of visual changes and headache Patient presents with elevated BP readings (>140/90). These  measurements remained elevated for >2 hours. Patient also endorsed history of visual changes with blurred vision especially at night and difficulty reading road signs. She also reported headache which occurs every 2-3 days, is severe on onset, and often prevents her from sleeping. Concern for possible Pre-E --CBC --CMP --Urine Pr/Cr ratio --continue monitoring with serial BP  Gestational Diabetes Patient was previously diagnosed with gestational diabetes, glucose on presentation 37 --counseled patient on diabetes management with diet and exercise and avoiding foods high in  sugar, instructed patient to follow up as outpatient for more precise management instructions --patient scheduled for GDM course on 12/17  Reassessment/Plan -Headache relieved with Tylenol 1000 mg -CBC reveals mild anemia (Hb 11) consistent with anemia of pregnancy, Hb 11.4 on 09/19/19, otherwise normal -CMP not concerning for Pre-E, transaminases normal (AST/ALT 15 and 13 respectively), Cr .58 similar to that on 09/19/2019 (.53), Glucose of 108 consistent previous gestational Diabetes diagnosis  -Pr/Cr ratio .12, not consistent with Pre-Eclampsia diagnosis -confirmed diagnosis of gHTN with elevated pressures noted on 12/1 and continued elevation BP >140/90 throughout stay  She is scheduled for an office visit on 12/16 and is encouraged to keep that appointment. We discussed delivery at 37 weeks. Strict return precautions.   [place] Student 10/02/2019  2:06 PM    I confirm that I have verified the information documented in the medical student's note and that I have also personally reperformed the history, physical exam and all medical decision making activities of this service and have verified that all service and findings are accurately documented in this student's note.   Pelvic exam performed by myself. Labia minora/majora without lesions, or varicosities. Cervix is closed, thick, high.   Lezlie Lye, NP 10/02/2019 6:34 PM

## 2019-10-02 NOTE — Progress Notes (Signed)
Patient given discharge instructions, follow-up, medications, and maternity belt discussed. Patient given instructions to return if labor ensues, VB, LOF, and decreased FM. Pt verbalizes understanding.

## 2019-10-02 NOTE — Discharge Instructions (Signed)
Round Ligament Pain During Pregnancy   Round ligament pain is a sharp pain or jabbing feeling often felt in the lower belly or groin area on one or both sides. It is one of the most common complaints during pregnancy and is considered a normal part of pregnancy. It is most often felt during the second trimester.   Here is what you need to know about round ligament pain, including some tips to help you feel better.   Causes of Round Ligament Pain   Several thick ligaments surround and support your womb (uterus) as it grows during pregnancy. One of them is called the round ligament.   The round ligament connects the front part of the womb to your groin, the area where your legs attach to your pelvis. The round ligament normally tightens and relaxes slowly.   As your baby and womb grow, the round ligament stretches. That makes it more likely to become strained.   Sudden movements can cause the ligament to tighten quickly, like a rubber band snapping. This causes a sudden and quick jabbing feeling.   Symptoms of Round Ligament Pain   Round ligament pain can be concerning and uncomfortable. But it is considered normal as your body changes during pregnancy.   The symptoms of round ligament pain include a sharp, sudden spasm in the belly. It usually affects the right side, but it may happen on both sides. The pain only lasts a few seconds.   Exercise may cause the pain, as will rapid movements such as:  sneezing  coughing  laughing  rolling over in bed  standing up too quickly   Treatment of Round Ligament Pain   Here are some tips that may help reduce your discomfort:   Pain relief. Take over-the-counter acetaminophen for pain, if necessary. Ask your doctor if this is OK.   Exercise. Get plenty of exercise to keep your stomach (core) muscles strong. Doing stretching exercises or prenatal yoga can be helpful. Ask your doctor which exercises are safe for you and your baby.   A helpful  exercise involves putting your hands and knees on the floor, lowering your head, and pushing your backside into the air.   Avoid sudden movements. Change positions slowly (such as standing up or sitting down) to avoid sudden movements that may cause stretching and pain.   Flex your hips. Bend and flex your hips before you cough, sneeze, or laugh to avoid pulling on the ligaments.   Apply warmth. A heating pad or warm bath may be helpful. Ask your doctor if this is OK. Extreme heat can be dangerous to the baby.   You should try to modify your daily activity level and avoid positions that may worsen the condition.   When to Call the Doctor/Midwife   Always tell your doctor or midwife about any type of pain you have during pregnancy. Round ligament pain is quick and doesn't last long.   Call your health care provider immediately if you have:  severe pain  fever  chills  pain on urination  difficulty walking   Belly pain during pregnancy can be due to many different causes. It is important for your doctor to rule out more serious conditions, including pregnancy complications such as placenta abruption or non-pregnancy illnesses such as:  inguinal hernia  appendicitis  stomach, liver, and kidney problems  Preterm labor pains may sometimes be mistaken for round ligament pain.      PREGNANCY SUPPORT BELT: You are not alone,  PREGNANCY SUPPORT BELT: You are not alone, Seventy-five percent of women have some sort of abdominal or back pain at some point in their pregnancy. Your baby is growing at a fast pace, which means that your whole body is rapidly trying to adjust to the changes. As your uterus grows, your back may start feeling a bit under stress and this can result in back or abdominal pain that can go from mild, and therefore bearable, to severe pains that will not allow you to sit or lay down comfortably, When it comes to dealing with pregnancy-related pains and cramps, some pregnant women usually prefer  natural remedies, which the market is filled with nowadays. For example, wearing a pregnancy support belt can help ease and lessen your discomfort and pain. WHAT ARE THE BENEFITS OF WEARING A PREGNANCY SUPPORT BELT? A pregnancy support belt provides support to the lower portion of the belly taking some of the weight of the growing uterus and distributing to the other parts of your body. It is designed make you comfortable and gives you extra support. Over the years, the pregnancy apparel market has been studying the needs and wants of pregnant women and they have come up with the most comfortable pregnancy support belts that woman could ever ask for. In fact, you will no longer have to wear a stretched-out or bulky pregnancy belt that is visible underneath your clothes and makes you feel even more uncomfortable. Nowadays, a pregnancy support belt is made of comfortable and stretchy materials that will not irritate your skin but will actually make you feel at ease and you will not even notice you are wearing it. They are easy to put on and adjust during the day and can be worn at night for additional support.  BENEFITS: . Relives Back pain . Relieves Abdominal Muscle and Leg Pain . Stabilizes the Pelvic Ring . Offers a Cushioned Abdominal Lift Pad . Relieves pressure on the Sciatic Nerve Within Minutes WHERE TO GET YOUR PREGNANCY BELT: [facility] [phone] @[address] [place], [place] [address]  

## 2019-10-02 NOTE — MAU Note (Signed)
FHR not tracing at times due to pt sitting up in bed and maternal obesity

## 2019-10-02 NOTE — MAU Note (Signed)
.   Denise Barker is a 33 y.o. at [redacted]w[redacted]d here in MAU reporting:lower back and vaginal pressure for two weeks. Denies any VB or LOF. She states she was just DX with GDM but has not had or appointment to discuss plan  Onset of complaint: 2 weeks Pain score: 10 Vitals:   10/02/19 1105  BP: (!) 140/94  Pulse: 94  Resp: 16  Temp: 97.6 F (36.4 C)  SpO2: 99%     FHT:130  Lab orders placed from triage:

## 2019-10-03 ENCOUNTER — Telehealth: Payer: 59 | Admitting: Student

## 2019-10-04 ENCOUNTER — Encounter: Payer: Self-pay | Admitting: Obstetrics and Gynecology

## 2019-10-04 ENCOUNTER — Telehealth: Payer: 59 | Admitting: Obstetrics and Gynecology

## 2019-10-04 ENCOUNTER — Other Ambulatory Visit: Payer: Self-pay

## 2019-10-04 DIAGNOSIS — O24410 Gestational diabetes mellitus in pregnancy, diet controlled: Secondary | ICD-10-CM

## 2019-10-04 DIAGNOSIS — O133 Gestational [pregnancy-induced] hypertension without significant proteinuria, third trimester: Secondary | ICD-10-CM

## 2019-10-04 DIAGNOSIS — O99210 Obesity complicating pregnancy, unspecified trimester: Secondary | ICD-10-CM

## 2019-10-04 DIAGNOSIS — Z3493 Encounter for supervision of normal pregnancy, unspecified, third trimester: Secondary | ICD-10-CM

## 2019-10-04 NOTE — Progress Notes (Signed)
Called pt @ 1111 and unable to LM that I am calling in regards to her appt.  I will call back in 15 min in which if I do not reach you I will have to request that you reschedule.    Called pt @ 1130 and LM that this is my second attempt in trying to reach you for your virtual appt.  I will have to request that you call the office to reschedule.   Mel Almond, RN 10/04/19

## 2019-10-05 ENCOUNTER — Encounter: Payer: 59 | Attending: Obstetrics and Gynecology | Admitting: *Deleted

## 2019-10-05 ENCOUNTER — Other Ambulatory Visit: Payer: Self-pay

## 2019-10-05 ENCOUNTER — Ambulatory Visit: Payer: 59 | Admitting: *Deleted

## 2019-10-05 ENCOUNTER — Other Ambulatory Visit: Payer: Self-pay | Admitting: Lactation Services

## 2019-10-05 DIAGNOSIS — O24419 Gestational diabetes mellitus in pregnancy, unspecified control: Secondary | ICD-10-CM | POA: Insufficient documentation

## 2019-10-05 DIAGNOSIS — Z713 Dietary counseling and surveillance: Secondary | ICD-10-CM | POA: Insufficient documentation

## 2019-10-05 MED ORDER — ACCU-CHEK GUIDE W/DEVICE KIT: 1.0000 | PACK | Freq: Once | 0 refills | Status: AC

## 2019-10-05 MED ORDER — ACCU-CHEK SOFTCLIX LANCETS MISC: 12 refills | Status: DC

## 2019-10-05 MED ORDER — ACCU-CHEK GUIDE VI STRP: ORAL_STRIP | 12 refills | Status: DC

## 2019-10-05 NOTE — Progress Notes (Signed)
  Patient was seen on 10/05/2019 for Gestational Diabetes self-management. EDD 12/08/2019. Patient states no history of GDM. She also states she has a strong family history for Diabetes on both her parents' sides.  Diet history obtained. Patient eats fair variety of all food groups, occasional vegetables. She has improved her eating habits by trying to cook more meals at home and choosing less fast food. She has also cut out regular sodas, fruit juices and ice cream since she learned of her diagnosis. Beverages include water, seltzer water, occasional juice and Gingerale. 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 The following learning objectives were met by the patient :   States the definition of Gestational Diabetes  States why dietary management is important in controlling blood glucose  Describes the effects of carbohydrates on blood glucose levels  Demonstrates ability to create a balanced meal plan  Demonstrates carbohydrate counting   States when to check blood glucose levels  Demonstrates proper blood glucose monitoring techniques  States the effect of stress and exercise on blood glucose levels  States the importance of limiting caffeine and abstaining from alcohol and smoking  Plan:  Aim for 3-4 Carb Choices per meal (45-60 grams)  Aim for 1-2 Carbs per snack Begin reading food labels for Total Carbohydrate of foods If OK with your MD, consider  increasing your activity level by walking, Arm Chair Exercises or other activity daily as tolerated Begin checking BG before breakfast and 2 hours after first bite of breakfast, lunch and dinner as directed by MD  Bring Log Book/Sheet and meter to every medical appointment OR use Baby Scripts (see below) Baby Scripts:  Patient was introduced to [employer] and plans to use as record of BG electronically  Take medication if directed by MD  Blood glucose monitor Rx called into pharmacy: Accu Check Guide  with Fast Clix drums and Soft Clix lancets Patient instructed to test pre breakfast and 2 hours each meal as directed by MD  Patient instructed to monitor glucose levels: FBS: 60 - 95 mg/dl 2 hour: <120 mg/dl  Patient received the following handouts:  Nutrition Diabetes and Pregnancy  Carbohydrate Counting List  Patient will be seen for follow-up as needed.

## 2019-10-10 ENCOUNTER — Other Ambulatory Visit: Payer: Self-pay | Admitting: *Deleted

## 2019-10-10 DIAGNOSIS — O24419 Gestational diabetes mellitus in pregnancy, unspecified control: Secondary | ICD-10-CM

## 2019-10-16 ENCOUNTER — Telehealth: Payer: Self-pay | Admitting: *Deleted

## 2019-10-16 NOTE — Telephone Encounter (Signed)
Denise Barker left a message she is calling about her blood pressure cuff. States she doesn't think it is accurate and may be too small. Would like to know if she needs to bring it to the office to get a new one. Denise Petree,RN

## 2019-10-16 NOTE — Telephone Encounter (Signed)
Called patient about BP cuff she states that we gave it to her and that it doesn't go all the way up her arm. Advised to bring in on her visit on the 4th of January so we can see what we can do for her, pt verbalized understanding and ended the call.

## 2019-10-16 NOTE — Telephone Encounter (Signed)
I called [place] back and left a message I am returning your call. If you are still having issues re: what you left message about on 12/24- please call our office and leave a detailed message.  I also sent a MyChart message.  Franki Stemen,RN

## 2019-10-16 NOTE — Telephone Encounter (Signed)
[place] called and left a message 10/12/19 2:19pm she is calling to speak to Bev paddock re: having issues with her meter. Would like a call back. Elizardo Chilson,RN

## 2019-10-23 ENCOUNTER — Telehealth: Payer: Self-pay | Admitting: *Deleted

## 2019-10-23 ENCOUNTER — Ambulatory Visit (INDEPENDENT_AMBULATORY_CARE_PROVIDER_SITE_OTHER): Payer: 59 | Admitting: Family Medicine

## 2019-10-23 ENCOUNTER — Telehealth: Payer: Self-pay | Admitting: Family Medicine

## 2019-10-23 VITALS — BP 129/91 | HR 106 | Wt 313.0 lb

## 2019-10-23 DIAGNOSIS — R102 Pelvic and perineal pain: Secondary | ICD-10-CM

## 2019-10-23 DIAGNOSIS — O133 Gestational [pregnancy-induced] hypertension without significant proteinuria, third trimester: Secondary | ICD-10-CM

## 2019-10-23 DIAGNOSIS — O99213 Obesity complicating pregnancy, third trimester: Secondary | ICD-10-CM

## 2019-10-23 DIAGNOSIS — Z3493 Encounter for supervision of normal pregnancy, unspecified, third trimester: Secondary | ICD-10-CM

## 2019-10-23 DIAGNOSIS — O26893 Other specified pregnancy related conditions, third trimester: Secondary | ICD-10-CM

## 2019-10-23 DIAGNOSIS — O24419 Gestational diabetes mellitus in pregnancy, unspecified control: Secondary | ICD-10-CM

## 2019-10-23 DIAGNOSIS — O99210 Obesity complicating pregnancy, unspecified trimester: Secondary | ICD-10-CM

## 2019-10-23 DIAGNOSIS — Z3A33 33 weeks gestation of pregnancy: Secondary | ICD-10-CM

## 2019-10-23 MED ORDER — BLOOD PRESSURE KIT DEVI: 1.0000 | Freq: Every day | 0 refills | Status: DC

## 2019-10-23 MED ORDER — METFORMIN HCL 500 MG PO TABS: 500.0000 mg | ORAL_TABLET | Freq: Every day | ORAL | 3 refills | Status: DC

## 2019-10-23 MED ORDER — COMFORT FIT MATERNITY SUPP LG MISC: 1.0000 | Freq: Every day | 0 refills | Status: DC | PRN

## 2019-10-23 NOTE — Telephone Encounter (Signed)
[facility] left a voicemail 10/19/19 pm that she is calling to see if she can get an earlier appointment for Monday 10/23/19. States appt is 1:45 and she has to be at work at [employer]

## 2019-10-23 NOTE — Telephone Encounter (Signed)
Diella called and left another message that she had asked for an earlier appointment. States she can't keep afternoon apointment because of work. States she is in some kind of weird pain- states I guess you suggest to go to ED. I have appt tomorrow. Per chart review registrar did try to reach patient to tell her she moved appt to this am 10:55. I called [place] and she confirms she got the message and is on her way for her appointment and will discuss issues then. Shonice Wrisley,RN

## 2019-10-23 NOTE — Telephone Encounter (Signed)
Called the patient and left a detailed voicemail informing of no available appointments this week to reschedule. Please call our office at [phone] if you have any questions or concerns.  The appointment was unchanged.

## 2019-10-23 NOTE — Progress Notes (Signed)
   PRENATAL VISIT NOTE  Subjective:  Denise Barker is a 34 y.o. G1P0 at [redacted]w[redacted]d being seen today for ongoing prenatal care.  She is currently monitored for the following issues for this high-risk pregnancy and has Asthma; Anxiety; Depression affecting pregnancy; Supervision of low-risk pregnancy; Obesity in pregnancy; Nausea and vomiting in pregnancy; Gestational diabetes; and Gestational hypertension on their problem list.  Patient reports pubic bone pain.  Contractions: Irritability. Vag. Bleeding: None.  Movement: Present. Denies leaking of fluid.   The following portions of the patient's history were reviewed and updated as appropriate: allergies, current medications, past family history, past medical history, past social history, past surgical history and problem list.   Objective:   Vitals:   10/23/19 1105  BP: (!) 129/91  Pulse: (!) 106  Weight: (!) 313 lb (142 kg)    Fetal Status: Fetal Heart Rate (bpm): 152   Movement: Present     General:  Alert, oriented and cooperative. Patient is in no acute distress.  Skin: Skin is warm and dry. No rash noted.   Cardiovascular: Normal heart rate noted  Respiratory: Normal respiratory effort, no problems with respiration noted  Abdomen: Soft, gravid, appropriate for gestational age.  Pain/Pressure: Present     Pelvic: Cervical exam deferred        Extremities: Normal range of motion.  Edema: None  Mental Status: Normal mood and affect. Normal behavior. Normal judgment and thought content.   Assessment and Plan:  Pregnancy: G1P0 at [redacted]w[redacted]d 1. Encounter for supervision of low-risk pregnancy in third trimester FHT normal  2. Gestational diabetes mellitus (GDM), antepartum, gestational diabetes method of control unspecified Fasting CBGs elevated - eating sugar at night Start metformin with dinner Start antenatal testing Induce at 37 weeks (GHTN) - [place] MFM FETAL BPP W/NONSTRESS; Future  3. Gestational hypertension, third trimester Induce at  37 weeks - [place] MFM FETAL BPP W/NONSTRESS; Future  4. Obesity in pregnancy  5. Morbid obesity (HCC)  6. Pelvic pain affecting pregnancy in third trimester, antepartum   Preterm labor symptoms and general obstetric precautions including but not limited to vaginal bleeding, contractions, leaking of fluid and fetal movement were reviewed in detail with the patient. Please refer to After Visit Summary for other counseling recommendations.   No follow-ups on file.  Future Appointments  Date Time Provider Department Center  10/24/2019  9:45 AM [facility] [place] 2 [facility]  10/24/2019  9:55 AM [facility] NURSE [facility]    Levie Heritage, DO

## 2019-10-24 ENCOUNTER — Encounter (HOSPITAL_COMMUNITY): Payer: Self-pay

## 2019-10-24 ENCOUNTER — Ambulatory Visit (HOSPITAL_COMMUNITY)
Admission: RE | Admit: 2019-10-24 | Discharge: 2019-10-24 | Disposition: A | Discharge status: Home / Self Care | Payer: Medicaid Other | Source: Ambulatory Visit | Attending: Obstetrics | Admitting: Obstetrics

## 2019-10-24 ENCOUNTER — Other Ambulatory Visit (HOSPITAL_COMMUNITY): Payer: Self-pay | Admitting: *Deleted

## 2019-10-24 ENCOUNTER — Other Ambulatory Visit: Payer: Self-pay

## 2019-10-24 ENCOUNTER — Ambulatory Visit (HOSPITAL_COMMUNITY): Payer: Medicaid Other | Admitting: *Deleted

## 2019-10-24 ENCOUNTER — Other Ambulatory Visit: Payer: Self-pay | Admitting: Family Medicine

## 2019-10-24 DIAGNOSIS — F419 Anxiety disorder, unspecified: Secondary | ICD-10-CM | POA: Insufficient documentation

## 2019-10-24 DIAGNOSIS — O219 Vomiting of pregnancy, unspecified: Secondary | ICD-10-CM | POA: Diagnosis not present

## 2019-10-24 DIAGNOSIS — O99210 Obesity complicating pregnancy, unspecified trimester: Secondary | ICD-10-CM | POA: Insufficient documentation

## 2019-10-24 DIAGNOSIS — O99213 Obesity complicating pregnancy, third trimester: Secondary | ICD-10-CM | POA: Diagnosis not present

## 2019-10-24 DIAGNOSIS — O24415 Gestational diabetes mellitus in pregnancy, controlled by oral hypoglycemic drugs: Secondary | ICD-10-CM | POA: Diagnosis not present

## 2019-10-24 DIAGNOSIS — O99891 Other specified diseases and conditions complicating pregnancy: Secondary | ICD-10-CM

## 2019-10-24 DIAGNOSIS — J45909 Unspecified asthma, uncomplicated: Secondary | ICD-10-CM

## 2019-10-24 DIAGNOSIS — O99340 Other mental disorders complicating pregnancy, unspecified trimester: Secondary | ICD-10-CM | POA: Diagnosis not present

## 2019-10-24 DIAGNOSIS — O10013 Pre-existing essential hypertension complicating pregnancy, third trimester: Secondary | ICD-10-CM

## 2019-10-24 DIAGNOSIS — O133 Gestational [pregnancy-induced] hypertension without significant proteinuria, third trimester: Secondary | ICD-10-CM | POA: Insufficient documentation

## 2019-10-24 DIAGNOSIS — O24419 Gestational diabetes mellitus in pregnancy, unspecified control: Secondary | ICD-10-CM

## 2019-10-24 DIAGNOSIS — Z362 Encounter for other antenatal screening follow-up: Secondary | ICD-10-CM | POA: Diagnosis not present

## 2019-10-24 DIAGNOSIS — Z3A33 33 weeks gestation of pregnancy: Secondary | ICD-10-CM | POA: Diagnosis not present

## 2019-10-24 DIAGNOSIS — O10913 Unspecified pre-existing hypertension complicating pregnancy, third trimester: Secondary | ICD-10-CM

## 2019-10-24 DIAGNOSIS — O359XX0 Maternal care for (suspected) fetal abnormality and damage, unspecified, not applicable or unspecified: Secondary | ICD-10-CM

## 2019-10-24 DIAGNOSIS — F32A Depression, unspecified: Secondary | ICD-10-CM

## 2019-10-24 DIAGNOSIS — F329 Major depressive disorder, single episode, unspecified: Secondary | ICD-10-CM | POA: Diagnosis not present

## 2019-10-31 ENCOUNTER — Ambulatory Visit (HOSPITAL_BASED_OUTPATIENT_CLINIC_OR_DEPARTMENT_OTHER): Payer: Medicaid Other | Admitting: *Deleted

## 2019-10-31 ENCOUNTER — Ambulatory Visit (HOSPITAL_COMMUNITY)
Admission: RE | Admit: 2019-10-31 | Discharge: 2019-10-31 | Disposition: A | Discharge status: Home / Self Care | Payer: Medicaid Other | Source: Ambulatory Visit | Attending: Maternal & Fetal Medicine | Admitting: Maternal & Fetal Medicine

## 2019-10-31 ENCOUNTER — Ambulatory Visit (HOSPITAL_COMMUNITY): Payer: Medicaid Other | Admitting: *Deleted

## 2019-10-31 ENCOUNTER — Other Ambulatory Visit (HOSPITAL_COMMUNITY): Payer: Self-pay | Admitting: Maternal & Fetal Medicine

## 2019-10-31 ENCOUNTER — Encounter (HOSPITAL_COMMUNITY): Payer: Self-pay | Admitting: *Deleted

## 2019-10-31 ENCOUNTER — Other Ambulatory Visit: Payer: Self-pay

## 2019-10-31 DIAGNOSIS — F329 Major depressive disorder, single episode, unspecified: Secondary | ICD-10-CM | POA: Insufficient documentation

## 2019-10-31 DIAGNOSIS — O24415 Gestational diabetes mellitus in pregnancy, controlled by oral hypoglycemic drugs: Secondary | ICD-10-CM | POA: Diagnosis not present

## 2019-10-31 DIAGNOSIS — O99213 Obesity complicating pregnancy, third trimester: Secondary | ICD-10-CM | POA: Diagnosis not present

## 2019-10-31 DIAGNOSIS — Z3A34 34 weeks gestation of pregnancy: Secondary | ICD-10-CM | POA: Diagnosis not present

## 2019-10-31 DIAGNOSIS — O219 Vomiting of pregnancy, unspecified: Secondary | ICD-10-CM

## 2019-10-31 DIAGNOSIS — F419 Anxiety disorder, unspecified: Secondary | ICD-10-CM

## 2019-10-31 DIAGNOSIS — J45909 Unspecified asthma, uncomplicated: Secondary | ICD-10-CM

## 2019-10-31 DIAGNOSIS — O10013 Pre-existing essential hypertension complicating pregnancy, third trimester: Secondary | ICD-10-CM

## 2019-10-31 DIAGNOSIS — O99210 Obesity complicating pregnancy, unspecified trimester: Secondary | ICD-10-CM | POA: Diagnosis not present

## 2019-10-31 DIAGNOSIS — O99340 Other mental disorders complicating pregnancy, unspecified trimester: Secondary | ICD-10-CM | POA: Insufficient documentation

## 2019-10-31 DIAGNOSIS — O99891 Other specified diseases and conditions complicating pregnancy: Secondary | ICD-10-CM | POA: Diagnosis not present

## 2019-10-31 DIAGNOSIS — F32A Depression, unspecified: Secondary | ICD-10-CM

## 2019-10-31 DIAGNOSIS — O10913 Unspecified pre-existing hypertension complicating pregnancy, third trimester: Secondary | ICD-10-CM | POA: Diagnosis not present

## 2019-10-31 DIAGNOSIS — O24419 Gestational diabetes mellitus in pregnancy, unspecified control: Secondary | ICD-10-CM

## 2019-10-31 DIAGNOSIS — O359XX0 Maternal care for (suspected) fetal abnormality and damage, unspecified, not applicable or unspecified: Secondary | ICD-10-CM

## 2019-10-31 NOTE — Procedures (Signed)
Petra Buzan [birth year] [redacted]w[redacted]d  Fetus A Non-Stress Test Interpretation for 10/31/19  Indication: Diabetes  Fetal Heart Rate A Mode: External Baseline Rate (A): 135 bpm Variability: Moderate Accelerations: 15 x 15 Multiple birth?: No  Uterine Activity Mode: Palpation, Toco Contraction Frequency (min): None Resting Tone Palpated: Relaxed Resting Time: Adequate  Interpretation (Fetal Testing) Nonstress Test Interpretation: Reactive Comments: EFM tracing reviewed by Dr. Parke Poisson

## 2019-11-02 ENCOUNTER — Ambulatory Visit (INDEPENDENT_AMBULATORY_CARE_PROVIDER_SITE_OTHER): Payer: Medicaid Other | Admitting: Obstetrics & Gynecology

## 2019-11-02 ENCOUNTER — Other Ambulatory Visit: Payer: Self-pay

## 2019-11-02 VITALS — BP 129/86 | HR 102 | Wt 312.0 lb

## 2019-11-02 DIAGNOSIS — O24415 Gestational diabetes mellitus in pregnancy, controlled by oral hypoglycemic drugs: Secondary | ICD-10-CM

## 2019-11-02 DIAGNOSIS — O99340 Other mental disorders complicating pregnancy, unspecified trimester: Secondary | ICD-10-CM

## 2019-11-02 DIAGNOSIS — Z3A34 34 weeks gestation of pregnancy: Secondary | ICD-10-CM

## 2019-11-02 DIAGNOSIS — O99343 Other mental disorders complicating pregnancy, third trimester: Secondary | ICD-10-CM

## 2019-11-02 MED ORDER — METFORMIN HCL 500 MG PO TABS: 1000.0000 mg | ORAL_TABLET | Freq: Every day | ORAL | 3 refills | Status: DC

## 2019-11-02 NOTE — Progress Notes (Signed)
   PRENATAL VISIT NOTE  Subjective:  Denise Barker is a 34 y.o. G1P0 at [redacted]w[redacted]d being seen today for ongoing prenatal care.  She is currently monitored for the following issues for this high-risk pregnancy and has Asthma; Anxiety; Depression affecting pregnancy; Supervision of low-risk pregnancy; Obesity in pregnancy; Nausea and vomiting in pregnancy; Gestational diabetes; and Gestational hypertension on their problem list.  Patient reports occasional contractions and pressure, pain.  Contractions: Not present. Vag. Bleeding: None.  Movement: Present. Denies leaking of fluid.   The following portions of the patient's history were reviewed and updated as appropriate: allergies, current medications, past family history, past medical history, past social history, past surgical history and problem list.   Objective:   Vitals:   11/02/19 1041  BP: 129/86  Pulse: (!) 102  Weight: (!) 312 lb (141.5 kg)    Fetal Status: Fetal Heart Rate (bpm):  154   Movement: Present     General:  Alert, oriented and cooperative. Patient is in no acute distress.  Skin: Skin is warm and dry. No rash noted.   Cardiovascular: Normal heart rate noted  Respiratory: Normal respiratory effort, no problems with respiration noted  Abdomen: Soft, gravid, appropriate for gestational age.  Pain/Pressure: Present     Pelvic: Cervical exam deferred        Extremities: Normal range of motion.  Edema: None  Mental Status: Normal mood and affect. Normal behavior. Normal judgment and thought content.   Assessment and Plan:  Pregnancy: G1P0 at [redacted]w[redacted]d 1. Gestational diabetes mellitus (GDM) in third trimester controlled on oral hypoglycemic drug FBS up to 115, increased dosage - metFORMIN (GLUCOPHAGE) 500 MG tablet; Take 2 tablets (1,000 mg total) by mouth daily with supper.  Dispense: 60 tablet; Refill: 3  Preterm labor symptoms and general obstetric precautions including but not limited to vaginal bleeding, contractions, leaking  of fluid and fetal movement were reviewed in detail with the patient. Please refer to After Visit Summary for other counseling recommendations.   Return in about 1 week (around 11/09/2019).  Future Appointments  Date Time Provider Department Center  11/08/2019  9:45 AM [facility] [place] 2 [facility]  11/08/2019 10:00 AM [facility] NURSE [facility]  11/14/2019  9:45 AM [facility] [place] 2 [facility]  11/14/2019  9:50 AM WH-MFC NURSE [facility]US  11/21/2019 10:15 AM [facility] [place] 4 WH-M[facility]  11/21/2019 10:20 AM WH-M[facility] NURSE WH-[facility]-US    Scheryl Darter, MD

## 2019-11-02 NOTE — Progress Notes (Signed)
BRx Glucose:     

## 2019-11-02 NOTE — Patient Instructions (Signed)

## 2019-11-07 ENCOUNTER — Telehealth: Payer: Self-pay

## 2019-11-07 ENCOUNTER — Ambulatory Visit (HOSPITAL_COMMUNITY): Payer: 59

## 2019-11-07 NOTE — Telephone Encounter (Signed)
Pt called and left message on nurse VM requesting a call back to discuss appts on 11/08/19.  Called pt. Pt states she was sent home from work today because she was showing symptoms of COVID-19. Pt reports symptoms began on 11/04/19; symptoms include loss of taste and smell, congestion, runny nose, nausea, and chills.  Per chart review appts tomorrow are with MFM. Explained to pt this appt will need to be rescheduled and she will need to get a COVID test. Phone number given to schedule test appt at [facility]. MFM notified pt is showing symptoms of COVID and will need appt rescheduled. Pt encouraged to limit exposure to others as much as possible, including those in her household.

## 2019-11-08 ENCOUNTER — Ambulatory Visit: Payer: Medicaid Other | Attending: Internal Medicine

## 2019-11-08 ENCOUNTER — Ambulatory Visit (HOSPITAL_COMMUNITY): Payer: Medicaid Other

## 2019-11-08 ENCOUNTER — Other Ambulatory Visit: Payer: Self-pay

## 2019-11-08 ENCOUNTER — Other Ambulatory Visit (HOSPITAL_COMMUNITY): Payer: 59

## 2019-11-08 DIAGNOSIS — Z20822 Contact with and (suspected) exposure to covid-19: Secondary | ICD-10-CM

## 2019-11-09 LAB — NOVEL CORONAVIRUS, NAA: SARS-CoV-2, NAA: DETECTED — AB

## 2019-11-10 ENCOUNTER — Telehealth: Payer: Self-pay | Admitting: Nurse Practitioner

## 2019-11-10 ENCOUNTER — Encounter: Payer: Self-pay | Admitting: Medical

## 2019-11-10 ENCOUNTER — Encounter: Payer: Self-pay | Admitting: Obstetrics and Gynecology

## 2019-11-10 DIAGNOSIS — U071 COVID-19: Secondary | ICD-10-CM | POA: Insufficient documentation

## 2019-11-10 NOTE — Progress Notes (Signed)
Reviewed data from [employer], patient has been logging CBGs 4x per day. Has elevated CBGs. Per provider note, she was seen recently in office and CBGs reviewed with provider, metformin dosage increased.  Baldemar Lenis, M.D. Attending Center for [employer])

## 2019-11-10 NOTE — Telephone Encounter (Signed)
Attempted to call and discuss with Denise Barker about Covid symptoms and the use of bamlanivimab, a monoclonal antibody infusion for those with mild to moderate Covid symptoms and at a high risk of hospitalization.    Patient did not answer and unable to leave voicemail.   After further chart review patientt is not qualified for this infusion. She is currently pregnant (37 weeks).   Patient Active Problem List   Diagnosis Date Noted  . Gestational hypertension 10/02/2019  . Gestational diabetes 09/20/2019  . Nausea and vomiting in pregnancy 07/06/2019  . Obesity in pregnancy 06/01/2019  . Anxiety   . Depression affecting pregnancy   . Supervision of low-risk pregnancy   . Asthma 05/22/1995    [name], AGPCNP-BC Pager: [phone] Amion: Thea Alken

## 2019-11-13 ENCOUNTER — Telehealth (INDEPENDENT_AMBULATORY_CARE_PROVIDER_SITE_OTHER): Payer: Medicaid Other | Admitting: Family Medicine

## 2019-11-13 ENCOUNTER — Telehealth (HOSPITAL_COMMUNITY): Payer: Self-pay | Admitting: *Deleted

## 2019-11-13 DIAGNOSIS — O24415 Gestational diabetes mellitus in pregnancy, controlled by oral hypoglycemic drugs: Secondary | ICD-10-CM

## 2019-11-13 DIAGNOSIS — O133 Gestational [pregnancy-induced] hypertension without significant proteinuria, third trimester: Secondary | ICD-10-CM

## 2019-11-13 DIAGNOSIS — Z3A36 36 weeks gestation of pregnancy: Secondary | ICD-10-CM

## 2019-11-13 DIAGNOSIS — U071 COVID-19: Secondary | ICD-10-CM

## 2019-11-13 DIAGNOSIS — Z3493 Encounter for supervision of normal pregnancy, unspecified, third trimester: Secondary | ICD-10-CM

## 2019-11-13 DIAGNOSIS — O321XX0 Maternal care for breech presentation, not applicable or unspecified: Secondary | ICD-10-CM

## 2019-11-13 NOTE — Telephone Encounter (Signed)
Pt concern addressed at her ob visit on 11/13/19.    Addison Naegeli, RN

## 2019-11-13 NOTE — Telephone Encounter (Signed)
Preadmission screen  

## 2019-11-13 NOTE — Progress Notes (Signed)
TELEHEALTH OBSTETRICS PRENATAL VIRTUAL VIDEO VISIT ENCOUNTER NOTE  Provider [facility]: [facility]   I connected with Denise Barker on 11/13/19 at  8:15 AM EST by MyChart Video Encounter at home and verified that I am speaking with the correct person using two identifiers.   I discussed the limitations, risks, security and privacy concerns of performing an evaluation and management service virtually and the availability of in person appointments. I also discussed with the patient that there may be a patient responsible charge related to this service. The patient expressed understanding and agreed to proceed. Subjective:  Denise Barker is a 34 y.o. G1P0 at 1935w3d being seen today for ongoing prenatal care.  She is currently monitored for the following issues for this high-risk pregnancy and has Asthma; Anxiety; Depression affecting pregnancy; Supervision of low-risk pregnancy; Obesity in pregnancy; Nausea and vomiting in pregnancy; Gestational diabetes; Gestational hypertension; and COVID-19 on their problem list.  Patient reports no complaints.  Contractions: Not present. Vag. Bleeding: None.  Movement: Present. Denies any leaking of fluid.   The following portions of the patient's history were reviewed and updated as appropriate: allergies, current medications, past family history, past medical history, past social history, past surgical history and problem list.   Objective:  There were no vitals filed for this visit.  Fetal Status:     Movement: Present     General:  Alert, oriented and cooperative. Patient is in no acute distress.  Respiratory: Normal respiratory effort, no problems with respiration noted  Mental Status: Normal mood and affect. Normal behavior. Normal judgment and thought content.  Rest of physical exam deferred due to type of encounter  Imaging: US MFM FETAL BPP WO NON STRESS  Result Date:  10/24/2019 ----------------------------------------------------------------------  OBSTETRICS REPORT                       (Signed Final 10/24/2019 11:06 am) ---------------------------------------------------------------------- Patient Info  ID #:       [id number]                          D.O.B.:  [birth year] (33 yrs)  Name:       Denise Barker                       Visit Date: 10/24/2019 10:33 am ---------------------------------------------------------------------- Performed By  Performed By:     Percell BostonHeather Waken          Ref. Address:     [address]                    [place]                                                             [address]  Attending:        Lin Landsmanorenthian Booker      [facility]  Referred By:      [facility] ---------------------------------------------------------------------- Orders   #  Description                          Code         Ordered By   1  [place] MFM OB FOLLOW UP                  76816.01     YU FANG   2  [place] MFM FETAL BPP WO NON              76819.01     Aldrin Engelhard      STRESS  ----------------------------------------------------------------------   #  Order #                    Accession #                 Episode #   1  [id number]                  [id number]                  [id number]   2  [id number]                  [id number]                  [id number]  ---------------------------------------------------------------------- Indications   [redacted] weeks gestation of pregnancy                Z3A.33   Obesity complicating pregnancy, third          O99.213   trimester   Fetal abnormality - other known or             O35.9XX0   suspected (LVEIF)   Encounter for other antenatal screening        Z36.2   follow-up   Diabetes - Gestational (oral controlled)       O24.419   Hypertension - Chronic/Pre-existing            O10.019   Asthma                                         O99.89 j45.909   ---------------------------------------------------------------------- Fetal Evaluation  Num Of Fetuses:         1  Fetal Heart Rate(bpm):  129  Cardiac Activity:       Observed  Presentation:           Breech  Placenta:               Anterior  P. Cord Insertion:      Previously Visualized  Amniotic Fluid  AFI FV:      Within normal limits  AFI Sum(cm)     %Tile       Largest Pocket(cm)  17.91           66          5.46  RUQ(cm)       RLQ(cm)       LUQ(cm)        LLQ(cm)  4.07          5.46          5.06  3.32 ---------------------------------------------------------------------- Biophysical Evaluation  Amniotic F.V:   Within normal limits       F. Tone:        Observed  F. Movement:    Observed                   Score:          8/8  F. Breathing:   Observed ---------------------------------------------------------------------- Biometry  BPD:      90.3  mm     G. Age:  36w 4d         99  %    CI:        77.16   %    70 - 86                                                          FL/HC:      20.8   %    19.4 - 21.8  HC:      325.5  mm     G. Age:  36w 6d         90  %    HC/AC:      1.12        0.96 - 1.11  AC:       290   mm     G. Age:  33w 0d         35  %    FL/BPD:     75.0   %    71 - 87  FL:       67.7  mm     G. Age:  34w 6d         72  %    FL/AC:      23.3   %    20 - 24  Est. FW:    2385  gm      5 lb 4 oz     64  % ---------------------------------------------------------------------- OB History  Gravidity:    1         Term:   0        Prem:   0        SAB:   0  TOP:          0       Ectopic:  0        Living: 0 ---------------------------------------------------------------------- Gestational Age  LMP:           33w 4d        Date:  03/03/19                 EDD:   12/08/19  U/S Today:     35w 2d                                        EDD:   11/26/19  Best:          33w 4d     Det. By:  LMP  (03/03/19)          EDD:   12/08/19  ---------------------------------------------------------------------- Anatomy  Cranium:               Appears normal  LVOT:                   Previously seen  Cavum:                 Previously seen        Aortic Arch:            Previously seen  Ventricles:            Previously seen        Ductal Arch:            Previously seen  Choroid Plexus:        Previously seen        Diaphragm:              Previously seen  Cerebellum:            Previously seen        Stomach:                Appears normal, left                                                                        sided  Posterior Fossa:       Previously seen        Abdomen:                Previously seen  Nuchal Fold:           Previously seen        Abdominal Wall:         Previously seen  Face:                  Orbits and profile     Cord Vessels:           Previously seen                         previously seen  Lips:                  Previously seen        Kidneys:                Previously seen  Palate:                Not well visualized    Bladder:                Appears normal  Thoracic:              Previously seen        Spine:                  Limited views                                                                        previously seen  Heart:  Echogenic focus        Upper Extremities:      Previously seen                         in LV prev.  RVOT:                  Previously seen        Lower Extremities:      Previously seen  Other:  Heels and 5th digit previously seen. Nasal bone previously seen.          Technically difficult due to maternal habitus and fetal position. ---------------------------------------------------------------------- Cervix Uterus Adnexa  Cervix  Not visualized (advanced GA >24wks)  Uterus  No abnormality visualized.  Left Ovary  No adnexal mass visualized.  Right Ovary  No adnexal mass visualized.  Cul De Sac  No free fluid seen.  Adnexa  No abnormality visualized.  ---------------------------------------------------------------------- Impression  Follow up growth for A2GDM and chronic hypertension  BPP 8/8  Good fluid and fetal movement.  Know left ventricular EIF ---------------------------------------------------------------------- Recommendations  Given A2GDM and CHTN initiate weekly BPP  Repeat growth in 4 weeks. ----------------------------------------------------------------------               Lin Landsman, MD Electronically Signed Final Report   10/24/2019 11:06 am ----------------------------------------------------------------------  [place] MFM OB FOLLOW UP  Result Date: 10/24/2019 ----------------------------------------------------------------------  OBSTETRICS REPORT                       (Signed Final 10/24/2019 11:06 am) ---------------------------------------------------------------------- Patient Info  ID #:       [id number]                          D.O.B.:  [birth year] (33 yrs)  Name:       Denise Barker                       Visit Date: 10/24/2019 10:33 am ---------------------------------------------------------------------- Performed By  Performed By:     Percell Boston          Ref. Address:     [address]                    [place]                                                             [address]  Attending:        Lin Landsman      [facility]                    MD                                       Fetal Care  Referred By:      [facility] ---------------------------------------------------------------------- Orders   #  Description                          Code  Ordered By   1  [place] MFM OB FOLLOW UP                  [id number]     Peterson Ao   2  [place] MFM FETAL BPP WO NON              [id number]     Chalice Philbert      STRESS  ----------------------------------------------------------------------   #  Order #                    Accession #                 Episode #   1  [id number]                  [id number]   2  [id number]                  [id number]                  [id number]  ---------------------------------------------------------------------- Indications   [redacted] weeks gestation of pregnancy                F0X.32   Obesity complicating pregnancy, third          O99.213   trimester   Fetal abnormality - other known or             O35.9XX0   suspected (LVEIF)   Encounter for other antenatal screening        Z36.2   follow-up   Diabetes - Gestational (oral controlled)       O24.419   Hypertension - Chronic/Pre-existing            O10.019   Asthma                                         O99.89 j45.909  ---------------------------------------------------------------------- Fetal Evaluation  Num Of Fetuses:         1  Fetal Heart Rate(bpm):  129  Cardiac Activity:       Observed  Presentation:           Breech  Placenta:               Anterior  P. Cord Insertion:      Previously Visualized  Amniotic Fluid  AFI FV:      Within normal limits  AFI Sum(cm)     %Tile       Largest Pocket(cm)  17.91           66          5.46  RUQ(cm)       RLQ(cm)       LUQ(cm)        LLQ(cm)  4.07          5.46          5.06           3.32 ---------------------------------------------------------------------- Biophysical Evaluation  Amniotic F.V:   Within normal limits       F. Tone:        Observed  F. Movement:    Observed  Score:          8/8  F. Breathing:   Observed ---------------------------------------------------------------------- Biometry  BPD:      90.3  mm     G. Age:  36w 4d         99  %    CI:        77.16   %    70 - 86                                                          FL/HC:      20.8   %    19.4 - 21.8  HC:      325.5  mm     G. Age:  36w 6d         90  %    HC/AC:      1.12        0.96 - 1.11  AC:       290   mm     G. Age:  33w 0d         35  %    FL/BPD:     75.0   %    71 - 87  FL:       67.7  mm     G. Age:  34w 6d         72  %    FL/AC:      23.3   %    20 - 24  Est. FW:    2385  gm       5 lb 4 oz     64  % ---------------------------------------------------------------------- OB History  Gravidity:    1         Term:   0        Prem:   0        SAB:   0  TOP:          0       Ectopic:  0        Living: 0 ---------------------------------------------------------------------- Gestational Age  LMP:           33w 4d        Date:  03/03/19                 EDD:   12/08/19  U/S Today:     35w 2d                                        EDD:   11/26/19  Best:          33w 4d     Det. By:  LMP  (03/03/19)          EDD:   12/08/19 ---------------------------------------------------------------------- Anatomy  Cranium:               Appears normal         LVOT:                   Previously seen  Cavum:                 Previously seen  Aortic Arch:            Previously seen  Ventricles:            Previously seen        Ductal Arch:            Previously seen  Choroid Plexus:        Previously seen        Diaphragm:              Previously seen  Cerebellum:            Previously seen        Stomach:                Appears normal, left                                                                        sided  Posterior Fossa:       Previously seen        Abdomen:                Previously seen  Nuchal Fold:           Previously seen        Abdominal Wall:         Previously seen  Face:                  Orbits and profile     Cord Vessels:           Previously seen                         previously seen  Lips:                  Previously seen        Kidneys:                Previously seen  Palate:                Not well visualized    Bladder:                Appears normal  Thoracic:              Previously seen        Spine:                  Limited views                                                                        previously seen  Heart:                 Echogenic focus        Upper Extremities:      Previously seen  in LV prev.  RVOT:                  Previously seen         Lower Extremities:      Previously seen  Other:  Heels and 5th digit previously seen. Nasal bone previously seen.          Technically difficult due to maternal habitus and fetal position. ---------------------------------------------------------------------- Cervix Uterus Adnexa  Cervix  Not visualized (advanced GA >24wks)  Uterus  No abnormality visualized.  Left Ovary  No adnexal mass visualized.  Right Ovary  No adnexal mass visualized.  Cul De Sac  No free fluid seen.  Adnexa  No abnormality visualized. ---------------------------------------------------------------------- Impression  Follow up growth for A2GDM and chronic hypertension  BPP 8/8  Good fluid and fetal movement.  Know left ventricular EIF ---------------------------------------------------------------------- Recommendations  Given A2GDM and CHTN initiate weekly BPP  Repeat growth in 4 weeks. ----------------------------------------------------------------------               Lin Landsman, MD Electronically Signed Final Report   10/24/2019 11:06 am ----------------------------------------------------------------------  [place] MFM OB LIMITED  Result Date: 10/31/2019 ----------------------------------------------------------------------  OBSTETRICS REPORT                       (Signed Final 10/31/2019 02:21 pm) ---------------------------------------------------------------------- Patient Info  ID #:       [id number]                          D.O.B.:  [birth year] (33 yrs)  Name:       Denise Barker                       Visit Date: 10/31/2019 11:38 am ---------------------------------------------------------------------- Performed By  Performed By:     Emeline Darling BS,      Ref. Address:     [address]                    [place]                                                             [address]  Attending:        Ma Rings MD         [facility]   Referred By:      [facility] ---------------------------------------------------------------------- Orders   #  Description                          Code         Ordered By   1  [place] MFM OB LIMITED                    [phone]  Lin Landsman  ----------------------------------------------------------------------   #  Order #                    Accession #                 Episode #   1  [id number]                  [id number]                  [id number]  ---------------------------------------------------------------------- Indications   Hypertension - Chronic/Pre-existing            O10.019   Obesity complicating pregnancy, third          O99.213   trimester   Fetal abnormality - other known or             O35.9XX0   suspected (LVEIF)   Diabetes - Gestational (oral controlled)       O24.419   Asthma                                         O99.89 j45.909   [redacted] weeks gestation of pregnancy                Z3A.34  ---------------------------------------------------------------------- Fetal Evaluation  Num Of Fetuses:         1  Fetal Heart Rate(bpm):  143  Cardiac Activity:       Observed  Presentation:           Breech  Placenta:               Anterior  P. Cord Insertion:      Previously Visualized  Amniotic Fluid  AFI FV:      Within normal limits  AFI Sum(cm)     %Tile       Largest Pocket(cm)  8.75            11          4.52  RUQ(cm)                                    LLQ(cm)  4.52                                       4.23 ---------------------------------------------------------------------- OB History  Gravidity:    1         Term:   0        Prem:   0        SAB:   0  TOP:          0       Ectopic:  0        Living: 0 ---------------------------------------------------------------------- Gestational Age  LMP:           34w 4d        Date:  03/03/19  EDD:   12/08/19  Best:          34w 4d     Det. By:  LMP  (03/03/19)          EDD:   12/08/19  ---------------------------------------------------------------------- Comments  This patient was seen for a limited ultrasound to check the  amniotic fluid due to a history of gestational diabetes and  chronic hypertension.  The total AFI of 8.75 cm is within normal limits today.  The  patient also had a reactive nonstress test today.  Fetal breathing movements and fetal movements were noted  on today's ultrasound exam.  She will return in 1 week for fetal testing. ----------------------------------------------------------------------                   Ma Rings, MD Electronically Signed Final Report   10/31/2019 02:21 pm ----------------------------------------------------------------------   Assessment and Plan:  Pregnancy: G1P0 at [redacted]w[redacted]d 1. Encounter for supervision of low-risk pregnancy in third trimester Good fetal movement  2. Gestational diabetes mellitus (GDM) in third trimester controlled on oral hypoglycemic drug Increase metformin to 500mg  in AM and 1000mg  with dinner  3. Gestational hypertension, third trimester Mild range. Induce at 37 weeks  4. Spontaneous breech delivery, single or unspecified fetus Version scheduled  5. COVID-19 Diagnosed on 1/21. Will be 10 days on Sunday. Will schedule version for after this.  Preterm labor symptoms and general obstetric precautions including but not limited to vaginal bleeding, contractions, leaking of fluid and fetal movement were reviewed in detail with the patient. I discussed the assessment and treatment plan with the patient. The patient was provided an opportunity to ask questions and all were answered. The patient agreed with the plan and demonstrated an understanding of the instructions. The patient was advised to call back or seek an in-person office evaluation/go to [facility] at [facility] for any urgent or concerning symptoms. Please refer to After Visit Summary for other counseling recommendations.   I provided 12  minutes of face-to-face time during this encounter.  No follow-ups on file.  Future Appointments  Date Time Provider Department Center  11/14/2019  9:45 AM WH-MFC [facility] 2 WH-MFCUS MFC-US  11/14/2019  9:50 AM WH-MFC NURSE WH-MFC MFC-US  11/20/2019  8:00 AM MC-LD SCHED ROOM MC-INDC None  11/21/2019 10:15 AM WH-MFC 01/18/2020 4 WH-MFCUS MFC-US  11/21/2019 10:20 AM WH-MFC NURSE WH-MFC MFC-US    [place], DO Center for 01/19/2020, [facility]

## 2019-11-13 NOTE — Progress Notes (Signed)
I connected with  Greenland Raulerson on 11/13/19 at 0822 by telephone and verified that I am speaking with the correct person using two identifiers.   I discussed the limitations, risks, security and privacy concerns of performing an evaluation and management service by telephone and the availability of in person appointments. I also discussed with the patient that there may be a patient responsible charge related to this service. The patient expressed understanding and agreed to proceed.  Pt reports dizziness last night, headache a few days ago, denies any blurry vision. States she is unable to check BP at home because she was unable to get a cuff that fit so the readings are not accurate.      Marjo Bicker, RN 11/13/2019  8:21 AM

## 2019-11-14 ENCOUNTER — Ambulatory Visit (HOSPITAL_COMMUNITY): Payer: Medicaid Other

## 2019-11-15 ENCOUNTER — Telehealth (HOSPITAL_COMMUNITY): Payer: Self-pay | Admitting: *Deleted

## 2019-11-15 ENCOUNTER — Other Ambulatory Visit: Payer: Self-pay | Admitting: Advanced Practice Midwife

## 2019-11-15 NOTE — Telephone Encounter (Signed)
Preadmission screen  

## 2019-11-16 ENCOUNTER — Telehealth (HOSPITAL_COMMUNITY): Payer: Self-pay | Admitting: *Deleted

## 2019-11-16 DIAGNOSIS — Z029 Encounter for administrative examinations, unspecified: Secondary | ICD-10-CM

## 2019-11-16 NOTE — Telephone Encounter (Signed)
Preadmission screen  

## 2019-11-17 ENCOUNTER — Telehealth: Payer: Self-pay | Admitting: General Practice

## 2019-11-17 NOTE — Telephone Encounter (Signed)
Patient called and left message on nurse voicemail line stating her mychart account states she is having an induction on Monday and she was told that by the doctor. Patient states someone called and told her about ECV and stated she wasn't having an induction. Patient is confused and isn't sure what the plan is.   Called and spoke with Para March in birthing suites who states the appt may have been scheduled incorrectly but what should happen is patient will arrive at 7am on Monday as instructed and the ECV will be attempted. If successful patient will be admitted to a bed for IOL and if not c-section will be performed. She is working on Monday so she will ensure everything happens as discussed.   Called and discussed with patient and provided reassurance. Patient verbalized understanding.

## 2019-11-20 ENCOUNTER — Other Ambulatory Visit: Payer: Self-pay

## 2019-11-20 ENCOUNTER — Inpatient Hospital Stay (HOSPITAL_COMMUNITY): Payer: Medicaid Other

## 2019-11-20 ENCOUNTER — Encounter (HOSPITAL_COMMUNITY): Payer: Self-pay | Admitting: Family Medicine

## 2019-11-20 ENCOUNTER — Inpatient Hospital Stay (HOSPITAL_COMMUNITY)
Admission: AD | Admit: 2019-11-20 | Discharge: 2019-11-26 | DRG: 786 | Disposition: A | Discharge status: Home / Self Care | Payer: Medicaid Other | Attending: Obstetrics and Gynecology | Admitting: Obstetrics and Gynecology

## 2019-11-20 DIAGNOSIS — O9852 Other viral diseases complicating childbirth: Secondary | ICD-10-CM | POA: Diagnosis not present

## 2019-11-20 DIAGNOSIS — O9902 Anemia complicating childbirth: Secondary | ICD-10-CM | POA: Diagnosis present

## 2019-11-20 DIAGNOSIS — O1414 Severe pre-eclampsia complicating childbirth: Secondary | ICD-10-CM | POA: Diagnosis not present

## 2019-11-20 DIAGNOSIS — U071 COVID-19: Secondary | ICD-10-CM | POA: Diagnosis not present

## 2019-11-20 DIAGNOSIS — D649 Anemia, unspecified: Secondary | ICD-10-CM | POA: Diagnosis present

## 2019-11-20 DIAGNOSIS — Z30017 Encounter for initial prescription of implantable subdermal contraceptive: Secondary | ICD-10-CM | POA: Diagnosis not present

## 2019-11-20 DIAGNOSIS — F419 Anxiety disorder, unspecified: Secondary | ICD-10-CM | POA: Diagnosis present

## 2019-11-20 DIAGNOSIS — O114 Pre-existing hypertension with pre-eclampsia, complicating childbirth: Secondary | ICD-10-CM | POA: Diagnosis not present

## 2019-11-20 DIAGNOSIS — O24424 Gestational diabetes mellitus in childbirth, insulin controlled: Secondary | ICD-10-CM | POA: Diagnosis not present

## 2019-11-20 DIAGNOSIS — Z8616 Personal history of COVID-19: Secondary | ICD-10-CM | POA: Diagnosis not present

## 2019-11-20 DIAGNOSIS — O24419 Gestational diabetes mellitus in pregnancy, unspecified control: Secondary | ICD-10-CM | POA: Diagnosis present

## 2019-11-20 DIAGNOSIS — F329 Major depressive disorder, single episode, unspecified: Secondary | ICD-10-CM | POA: Diagnosis present

## 2019-11-20 DIAGNOSIS — O321XX0 Maternal care for breech presentation, not applicable or unspecified: Principal | ICD-10-CM | POA: Diagnosis present

## 2019-11-20 DIAGNOSIS — Z3490 Encounter for supervision of normal pregnancy, unspecified, unspecified trimester: Secondary | ICD-10-CM

## 2019-11-20 DIAGNOSIS — O411290 Chorioamnionitis, unspecified trimester, not applicable or unspecified: Secondary | ICD-10-CM

## 2019-11-20 DIAGNOSIS — Z3A37 37 weeks gestation of pregnancy: Secondary | ICD-10-CM

## 2019-11-20 DIAGNOSIS — O134 Gestational [pregnancy-induced] hypertension without significant proteinuria, complicating childbirth: Secondary | ICD-10-CM | POA: Diagnosis not present

## 2019-11-20 DIAGNOSIS — Z3046 Encounter for surveillance of implantable subdermal contraceptive: Secondary | ICD-10-CM | POA: Diagnosis not present

## 2019-11-20 DIAGNOSIS — J45909 Unspecified asthma, uncomplicated: Secondary | ICD-10-CM | POA: Diagnosis present

## 2019-11-20 DIAGNOSIS — O24425 Gestational diabetes mellitus in childbirth, controlled by oral hypoglycemic drugs: Secondary | ICD-10-CM | POA: Diagnosis present

## 2019-11-20 DIAGNOSIS — O411230 Chorioamnionitis, third trimester, not applicable or unspecified: Secondary | ICD-10-CM | POA: Diagnosis present

## 2019-11-20 DIAGNOSIS — O99214 Obesity complicating childbirth: Secondary | ICD-10-CM | POA: Diagnosis present

## 2019-11-20 DIAGNOSIS — O99344 Other mental disorders complicating childbirth: Secondary | ICD-10-CM | POA: Diagnosis not present

## 2019-11-20 DIAGNOSIS — O9952 Diseases of the respiratory system complicating childbirth: Secondary | ICD-10-CM | POA: Diagnosis present

## 2019-11-20 DIAGNOSIS — O99210 Obesity complicating pregnancy, unspecified trimester: Secondary | ICD-10-CM | POA: Diagnosis present

## 2019-11-20 DIAGNOSIS — Z3A00 Weeks of gestation of pregnancy not specified: Secondary | ICD-10-CM | POA: Diagnosis not present

## 2019-11-20 LAB — OB RESULTS CONSOLE GBS: GBS: NEGATIVE

## 2019-11-20 LAB — PROTEIN / CREATININE RATIO, URINE
Creatinine, Urine: 127.7 mg/dL
Protein Creatinine Ratio: 0.16 mg/mg{Cre} — ABNORMAL HIGH (ref 0.00–0.15)
Total Protein, Urine: 20 mg/dL

## 2019-11-20 LAB — COMPREHENSIVE METABOLIC PANEL
ALT: 17 U/L (ref 0–44)
AST: 20 U/L (ref 15–41)
Albumin: 2.4 g/dL — ABNORMAL LOW (ref 3.5–5.0)
Alkaline Phosphatase: 115 U/L (ref 38–126)
Anion gap: 11 (ref 5–15)
BUN: 6 mg/dL (ref 6–20)
CO2: 21 mmol/L — ABNORMAL LOW (ref 22–32)
Calcium: 9.4 mg/dL (ref 8.9–10.3)
Chloride: 106 mmol/L (ref 98–111)
Creatinine, Ser: 0.59 mg/dL (ref 0.44–1.00)
GFR calc Af Amer: >60 mL/min (ref >60)
GFR calc non Af Amer: >60 mL/min (ref >60)
Glucose, Bld: 100 mg/dL — ABNORMAL HIGH (ref 70–99)
Potassium: 3.6 mmol/L (ref 3.5–5.1)
Sodium: 138 mmol/L (ref 135–145)
Total Bilirubin: 0.5 mg/dL (ref 0.3–1.2)
Total Protein: 6.4 g/dL — ABNORMAL LOW (ref 6.5–8.1)

## 2019-11-20 LAB — TYPE AND SCREEN
ABO/RH(D): A POS
Antibody Screen: NEGATIVE

## 2019-11-20 LAB — CBC
HCT: 34.1 % — ABNORMAL LOW (ref 36.0–46.0)
Hemoglobin: 11.2 g/dL — ABNORMAL LOW (ref 12.0–15.0)
MCH: 29.6 pg (ref 26.0–34.0)
MCHC: 32.8 g/dL (ref 30.0–36.0)
MCV: 90.2 fL (ref 80.0–100.0)
Platelets: 347 10*3/uL (ref 150–400)
RBC: 3.78 MIL/uL — ABNORMAL LOW (ref 3.87–5.11)
RDW: 13.5 % (ref 11.5–15.5)
WBC: 7.3 10*3/uL (ref 4.0–10.5)
nRBC: 0 % (ref 0.0–0.2)

## 2019-11-20 LAB — GLUCOSE, CAPILLARY
Glucose-Capillary: 102 mg/dL — ABNORMAL HIGH (ref 70–99)
Glucose-Capillary: 107 mg/dL — ABNORMAL HIGH (ref 70–99)
Glucose-Capillary: 71 mg/dL (ref 70–99)
Glucose-Capillary: 83 mg/dL (ref 70–99)

## 2019-11-20 LAB — RPR: RPR Ser Ql: NONREACTIVE

## 2019-11-20 LAB — ABO/RH: ABO/RH(D): A POS

## 2019-11-20 LAB — GROUP B STREP BY PCR: Group B strep by PCR: NEGATIVE

## 2019-11-20 MED ORDER — LIDOCAINE HCL (PF) 1 % IJ SOLN: 30.0000 mL | INTRAMUSCULAR | Status: DC | PRN

## 2019-11-20 MED ORDER — SERTRALINE HCL 50 MG PO TABS
50.0000 mg | ORAL_TABLET | Freq: Every day | ORAL | Status: DC
  Administered 2019-11-20 – 2019-11-23 (×3): 50 mg via ORAL
  Filled 2019-11-20 (×7): qty 1

## 2019-11-20 MED ORDER — FENTANYL CITRATE (PF) 100 MCG/2ML IJ SOLN
50.0000 ug | INTRAMUSCULAR | Status: DC | PRN
  Administered 2019-11-21 – 2019-11-22 (×6): 100 ug via INTRAVENOUS
  Filled 2019-11-20 (×6): qty 2

## 2019-11-20 MED ORDER — OXYTOCIN BOLUS FROM INFUSION: 500.0000 mL | Freq: Once | INTRAVENOUS | Status: DC

## 2019-11-20 MED ORDER — TERBUTALINE SULFATE 1 MG/ML IJ SOLN: 0.2500 mg | Freq: Once | INTRAMUSCULAR | Status: DC | PRN

## 2019-11-20 MED ORDER — ONDANSETRON HCL 4 MG/2ML IJ SOLN
4.0000 mg | Freq: Four times a day (QID) | INTRAMUSCULAR | Status: DC | PRN
  Administered 2019-11-21 – 2019-11-23 (×2): 4 mg via INTRAVENOUS
  Filled 2019-11-20: qty 2

## 2019-11-20 MED ORDER — MISOPROSTOL 50MCG HALF TABLET
50.0000 ug | ORAL_TABLET | ORAL | Status: DC | PRN
  Administered 2019-11-20 – 2019-11-21 (×6): 50 ug via ORAL
  Filled 2019-11-20 (×7): qty 1

## 2019-11-20 MED ORDER — MISOPROSTOL 50MCG HALF TABLET
ORAL_TABLET | ORAL | Status: AC
  Administered 2019-11-20: 50 ug
  Filled 2019-11-20: qty 1

## 2019-11-20 MED ORDER — LACTATED RINGERS IV SOLN: INTRAVENOUS | Status: DC

## 2019-11-20 MED ORDER — TERBUTALINE SULFATE 1 MG/ML IJ SOLN
0.2500 mg | Freq: Once | INTRAMUSCULAR | Status: AC
  Administered 2019-11-20: 0.25 mg via SUBCUTANEOUS

## 2019-11-20 MED ORDER — TERBUTALINE SULFATE 1 MG/ML IJ SOLN
INTRAMUSCULAR | Status: AC
  Filled 2019-11-20: qty 1

## 2019-11-20 MED ORDER — LACTATED RINGERS IV SOLN: 500.0000 mL | INTRAVENOUS | Status: DC | PRN

## 2019-11-20 MED ORDER — OXYTOCIN 40 UNITS IN NORMAL SALINE INFUSION - SIMPLE MED: 2.5000 [IU]/h | INTRAVENOUS | Status: DC

## 2019-11-20 MED ORDER — SOD CITRATE-CITRIC ACID 500-334 MG/5ML PO SOLN
30.0000 mL | ORAL | Status: DC | PRN
  Filled 2019-11-20: qty 30

## 2019-11-20 MED ORDER — ACETAMINOPHEN 325 MG PO TABS
650.0000 mg | ORAL_TABLET | ORAL | Status: DC | PRN
  Administered 2019-11-23: 650 mg via ORAL
  Filled 2019-11-20: qty 2

## 2019-11-20 NOTE — Progress Notes (Signed)
Called Infection Prevention about pt covid status. Stated that she is past the 10 day window and without symptoms (aside from loss of taste and smell) and can be without precautions while admitted.

## 2019-11-20 NOTE — Progress Notes (Signed)
LABOR PROGRESS NOTE  Denise Barker is a 34 y.o. G1P0 at [redacted]w[redacted]d  admitted for IOL for gHTN.  Subjective: Feeling some cramps but overall still comfortable  Objective: BP 128/63   Pulse (!) 105   Temp 98.1 F (36.7 C) (Oral)   Resp 20   Ht 5\' 10"  (1.778 m)   Wt (!) 140.6 kg   LMP 03/03/2019   BMI 44.48 kg/m  or  Vitals:   11/20/19 1014 11/20/19 1111 11/20/19 1214 11/20/19 1432  BP: (!) 147/93 (!) 153/102 (!) 147/97 128/63  Pulse: 97 95 (!) 110 (!) 105  Resp: 20     Temp: 98.5 F (36.9 C)   98.1 F (36.7 C)  TempSrc: Oral   Oral  Weight:      Height:         Dilation: Closed Effacement (%): Thick Station: -3 Presentation: Vertex Exam by:: Dr. [email or web address] FHT: baseline rate 155, moderate varibility, +acel, -decel Toco: rare  Labs: Lab Results  Component Value Date   WBC 7.3 11/20/2019   HGB 11.2 (L) 11/20/2019   HCT 34.1 (L) 11/20/2019   MCV 90.2 11/20/2019   PLT 347 11/20/2019    Patient Active Problem List   Diagnosis Date Noted  . COVID-19 11/10/2019  . Gestational hypertension 10/02/2019  . Gestational diabetes 09/20/2019  . Nausea and vomiting in pregnancy 07/06/2019  . Obesity in pregnancy 06/01/2019  . Anxiety   . Depression affecting pregnancy   . Supervision of low-risk pregnancy   . Asthma 05/22/1995    Assessment / Plan: 34 y.o. G1P0 at [redacted]w[redacted]d here for IOL for gHTN.  Labor: cervix still closed, give second miso, FB once feasible Fetal Wellbeing:  Cat I Pain Control:  IV pain meds PRN, epidural upon request GBS: no culture available, rapid PCR neg, does not meet criteria for empiric treatment Anticipated MOD:  SVD  gHTN: admission baseline labs unremarkable, mild range to normotensive since arrival, ctm  GDMA2: CBG 102>107, check q4h latent labor q2h active labor  Depression: cont sertraline 50mg  QHS  COVID: positive on 11/08/2019 (12 days ago). Symptoms improving since that time and >10d since diagnosis, discussed with infection prevention and  no longer needs to be on precautions.   , MD/MPH OB Fellow  11/20/2019, 2:59 PM

## 2019-11-20 NOTE — Progress Notes (Signed)
Pt was retested for covid through [facility] on Saturday 11/18/19. Result came back negative today 11/20/19 and result is in pt's MyChart.

## 2019-11-20 NOTE — H&P (Signed)
LABOR AND DELIVERY ADMISSION HISTORY AND PHYSICAL NOTE  Denise Barker is a 34 y.o. female G1P0 with IUP at 2w3dby LMP c/w 19wk UKoreapresenting for ECV.   In initial UKoreapatient found to already be vertex.  She reports positive fetal movement. She denies leakage of fluid, vaginal bleeding, or contractions.   She denies headache, vision changes, chest pain, SOB, RUQ pain, LE edema.  She reports +COVID test on 11/08/2019, at that time had nasal congestion and loss of taste and smell. Since then reports congestion has resolved, taste/smell improving. Did not have a fever at any point.   She plans on breast feeding. Her contraception plan is: Nexplanon.  Prenatal History/Complications: [facility]  '@[redacted]w[redacted]d'$ , CWD, normal anatomy w isolated LV EICF, breech presentation, anterior placenta, 64%ile, EFW 2385 g  Pregnancy complications:  - GDMA2 on metformin 1g qhs  - gestational hypertension - depression on Zoloft '50mg'$   Past Medical History: Past Medical History:  Diagnosis Date  . Anxiety   . Dementia ([place]   . Gestational diabetes     Past Surgical History: Past Surgical History:  Procedure Laterality Date  . NO PAST SURGERIES      Obstetrical History: OB History    Gravida  1   Para      Term      Preterm      AB      Living        SAB      TAB      Ectopic      Multiple      Live Births              Social History: Social History   Socioeconomic History  . Marital status: Significant Other    Spouse name: Not on file  . Number of children: Not on file  . Years of education: Not on file  . Highest education level: Not on file  Occupational History  . Not on file  Tobacco Use  . Smoking status: Never Smoker  . Smokeless tobacco: Never Used  Substance and Sexual Activity  . Alcohol use: Not Currently    Comment: occasionally , not during pregnancy  . Drug use: Not Currently    Types: Marijuana    Comment: Last used late May  . Sexual  activity: Yes    Birth control/protection: None  Other Topics Concern  . Not on file  Social History Narrative  . Not on file   Social Determinants of Health   Financial Resource Strain:   . Difficulty of Paying Living Expenses: Not on file  Food Insecurity: Food Insecurity Present  . Worried About RCharity fundraiserin the Last Year: Sometimes true  . Ran Out of Food in the Last Year: Sometimes true  Transportation Needs: No Transportation Needs  . Lack of Transportation (Medical): No  . Lack of Transportation (Non-Medical): No  Physical Activity:   . Days of Exercise per Week: Not on file  . Minutes of Exercise per Session: Not on file  Stress:   . Feeling of Stress : Not on file  Social Connections:   . Frequency of Communication with Friends and Family: Not on file  . Frequency of Social Gatherings with Friends and Family: Not on file  . Attends Religious Services: Not on file  . Active Member of Clubs or Organizations: Not on file  . Attends CArchivistMeetings: Not on file  . Marital Status: Not  on file    Family History: Family History  Problem Relation Age of Onset  . Depression Mother   . Anxiety disorder Mother   . Hypertension Mother   . Depression Father   . Anxiety disorder Father   . Bronchitis Father     Allergies: No Known Allergies  Medications Prior to Admission  Medication Sig Dispense Refill Last Dose  . Accu-Chek Softclix Lancets lancets Use as instructed 100 each 12   . acetaminophen (TYLENOL) 500 MG tablet Take 500 mg by mouth every 6 (six) hours as needed.     Marland Kitchen albuterol (VENTOLIN HFA) 108 (90 Base) MCG/ACT inhaler Inhale 1 puff into the lungs every 6 (six) hours as needed for wheezing or shortness of breath.     Marland Kitchen aspirin EC 81 MG tablet Take 1 tablet (81 mg total) by mouth daily. 30 tablet 4   . Blood Pressure Monitoring (BLOOD PRESSURE KIT) DEVI 1 Device by Does not apply route as needed. ICD 10 :  Z34.90 1 Device 0   .  cyclobenzaprine (FLEXERIL) 10 MG tablet Take 1 tablet (10 mg total) by mouth 3 (three) times daily as needed for muscle spasms. (Patient not taking: Reported on 10/24/2019) 30 tablet 0   . Elastic Bandages & Supports (COMFORT FIT MATERNITY SUPP LG) MISC 1 each by Does not apply route daily as needed. 1 each 0   . glucose blood (ACCU-CHEK GUIDE) test strip Use as instructed (Patient not taking: Reported on 11/13/2019) 100 each 12   . metFORMIN (GLUCOPHAGE) 500 MG tablet Take 2 tablets (1,000 mg total) by mouth daily with supper. 60 tablet 3   . Prenatal 27-1 MG TABS Take 1 tablet by mouth daily. 30 tablet 9   . sertraline (ZOLOFT) 50 MG tablet Take 1 tablet (50 mg total) by mouth daily. 90 tablet 1      Review of Systems  All systems reviewed and negative except as stated in HPI  Physical Exam Blood pressure (!) 160/89, pulse 98, temperature 98.7 F (37.1 C), temperature source Oral, resp. rate 18, height '5\' 10"'$  (1.778 m), weight (!) 140.6 kg, last menstrual period 03/03/2019. General appearance: alert, oriented, NAD Lungs: normal respiratory effort Heart: regular rate Abdomen: soft, non-tender; gravid Extremities: No calf swelling or tenderness Presentation: cephalic by bedside ultrasound  Fetal monitoringBaseline: 140 bpm, Variability: Good {> 6 bpm), Accelerations: Reactive and Decelerations: Absent Uterine activityNone     Prenatal labs: ABO, Rh: --/--/A POS, A POS Performed at [facility] [address] [place], [place] [address]  [phone]) Antibody: NEG (02/01 3149) Rubella: 2.75 (08/13 0942) RPR: Non Reactive (12/01 0836)  HBsAg: Negative (08/13 7026)  HIV: Non Reactive (12/01 0836)  GC/Chlamydia: third trimester result not available, neg on 06/01/2019 and will order now  GBS:   no result 2-hr GTT: abnormal 09/19/2019 (106, 194, 114) Genetic screening:  Low risk female Anatomy US: normal, isolated EIF  Prenatal Transfer Tool  Maternal Diabetes: Yes:  Diabetes  Type:  Insulin/Medication controlled Genetic Screening: Normal Maternal Ultrasounds/Referrals: Normal and Isolated EIF (echogenic intracardiac focus) Fetal Ultrasounds or other Referrals:  None Maternal Substance Abuse:  No Significant Maternal Medications:  Meds include: Other: metformin 1g qhs Significant Maternal Lab Results: None  Results for orders placed or performed during the hospital encounter of 11/20/19 (from the past 24 hour(s))  CBC   Collection Time: 11/20/19  8:07 AM  Result Value Ref Range   WBC 7.3 4.0 - 10.5 K/uL  RBC 3.78 (L) 3.87 - 5.11 MIL/uL   Hemoglobin 11.2 (L) 12.0 - 15.0 g/dL   HCT 34.1 (L) 36.0 - 46.0 %   MCV 90.2 80.0 - 100.0 fL   MCH 29.6 26.0 - 34.0 pg   MCHC 32.8 30.0 - 36.0 g/dL   RDW 13.5 11.5 - 15.5 %   Platelets 347 150 - 400 K/uL   nRBC 0.0 0.0 - 0.2 %  Comprehensive metabolic panel   Collection Time: 11/20/19  8:07 AM  Result Value Ref Range   Sodium 138 135 - 145 mmol/L   Potassium 3.6 3.5 - 5.1 mmol/L   Chloride 106 98 - 111 mmol/L   CO2 21 (L) 22 - 32 mmol/L   Glucose, Bld 100 (H) 70 - 99 mg/dL   BUN 6 6 - 20 mg/dL   Creatinine, Ser 0.59 0.44 - 1.00 mg/dL   Calcium 9.4 8.9 - 10.3 mg/dL   Total Protein 6.4 (L) 6.5 - 8.1 g/dL   Albumin 2.4 (L) 3.5 - 5.0 g/dL   AST 20 15 - 41 U/L   ALT 17 0 - 44 U/L   Alkaline Phosphatase 115 38 - 126 U/L   Total Bilirubin 0.5 0.3 - 1.2 mg/dL   GFR calc non Af Amer >60 >60 mL/min   GFR calc Af Amer >60 >60 mL/min   Anion gap 11 5 - 15  Type and screen   Collection Time: 11/20/19  8:11 AM  Result Value Ref Range   ABO/RH(D) A POS    Antibody Screen NEG    Sample Expiration      11/23/2019,2359 Performed at [facility], [address] [place], [place] [address]   ABO/Rh   Collection Time: 11/20/19  8:11 AM  Result Value Ref Range   ABO/RH(D)      A POS Performed at [facility] [address] [place], [place] [address]   Glucose, capillary   Collection Time: 11/20/19  8:42  AM  Result Value Ref Range   Glucose-Capillary 102 (H) 70 - 99 mg/dL    Patient Active Problem List   Diagnosis Date Noted  . COVID-19 11/10/2019  . Gestational hypertension 10/02/2019  . Gestational diabetes 09/20/2019  . Nausea and vomiting in pregnancy 07/06/2019  . Obesity in pregnancy 06/01/2019  . Anxiety   . Depression affecting pregnancy   . Supervision of low-risk pregnancy   . Asthma 05/22/1995    Assessment: Denise Barker is a 34 y.o. G1P0 at 43w3dhere for ECV and found to be cephalic on initial bedside utlrasound, now staying for IOL for gestational hypertension.   #Labor: cervix closed on initial exam, start induction with PO miso #Pain: IV pain meds PRN, epidural upon request but hoping to go without #FWB: Cat I #GBS/ID: not collected, will send PCR #COVID: positive on 11/08/2019 (12 days ago). Symptoms improving since that time and >10d since diagnosis, discussed with infection prevention and no longer needs to be on precautions.  #MOF: Breast #MOC: Nexplanon #Circ: yes, inpatient  #GDMA2: on metformin 1g qhs as outpatient. CBG q4h latent labor, q2h active labor.   #gHTN: Asymptomatic, admission baseline labs sent. Initial BP's all mild range.   #Depression: cont Sertraline '50mg'$  QHS   MAnnice NeedyEPasteur Plaza Surgery Center LP2/10/2019, 10:07 AM

## 2019-11-20 NOTE — Progress Notes (Signed)
LABOR PROGRESS NOTE  Denise Barker is a 34 y.o. G1P0 at [redacted]w[redacted]d  admitted for IOL for gHTN.  Subjective: Not feeling much  Objective: BP 139/81   Pulse 89   Temp 98.1 F (36.7 C) (Oral)   Resp 18   Ht 5\' 10"  (1.778 m)   Wt (!) 140.6 kg   LMP 03/03/2019   BMI 44.48 kg/m  or  Vitals:   11/20/19 1432 11/20/19 1556 11/20/19 1721 11/20/19 1920  BP: 128/63 135/81 140/82 139/81  Pulse: (!) 105 (!) 102 85 89  Resp:  16 18   Temp: 98.1 F (36.7 C)     TempSrc: Oral     Weight:      Height:         Dilation: Closed Effacement (%): 50 Station: -3 Presentation: Vertex Exam by:: [email or web address] MD FHT: baseline rate 135, moderate varibility, +acel, -decel Toco: irritability  Labs: Lab Results  Component Value Date   WBC 7.3 11/20/2019   HGB 11.2 (L) 11/20/2019   HCT 34.1 (L) 11/20/2019   MCV 90.2 11/20/2019   PLT 347 11/20/2019    Patient Active Problem List   Diagnosis Date Noted  . COVID-19 11/10/2019  . Gestational hypertension 10/02/2019  . Gestational diabetes 09/20/2019  . Nausea and vomiting in pregnancy 07/06/2019  . Obesity in pregnancy 06/01/2019  . Anxiety   . Depression affecting pregnancy   . Supervision of low-risk pregnancy   . Asthma 05/22/1995    Assessment / Plan: 34 y.o. G1P0 at [redacted]w[redacted]d here for IOL for gHTN.  Labor: cervix still closed, give third miso, FB once feasible Fetal Wellbeing:  Cat I Pain Control:  IV pain meds PRN, epidural upon request GBS: no culture available, rapid PCR neg, does not meet criteria for empiric treatment Anticipated MOD:  SVD  gHTN: admission baseline labs unremarkable, mild range to normotensive since arrival, ctm  GDMA2: CBG 102>107>71, check q4h latent labor q2h active labor  Depression: cont sertraline 50mg  QHS  COVID: positive on 11/08/2019 (12 days ago). Symptoms improving since that time and >10d since diagnosis, discussed with infection prevention and no longer needs to be on precautions.   ,  MD/MPH OB Fellow  11/20/2019, 7:38 PM

## 2019-11-20 NOTE — Progress Notes (Signed)
Labor Progress Note Denise Barker is a 34 y.o. G1P0 at [redacted]w[redacted]d presented for IOL for gHTN and A2GDM  S:  Comfortable, resting, denies pain or ctx. S/p Cytotec 3  O:  BP 139/81   Pulse 89   Temp 98.1 F (36.7 C) (Oral)   Resp 20   Ht 5\' 10"  (1.778 m)   Wt (!) 140.6 kg   LMP 03/03/2019   BMI 44.48 kg/m  EFM: baseline 135 bpm/ mod variability/ + accels/ no decels  Toco/IUPC: irregular SVE: Dilation: Closed Effacement (%): 50 Station: -3 Presentation: Vertex Exam by:: Eckstat MD  A/P: 34 y.o. G1P0 [redacted]w[redacted]d  1. Labor: latent 2. FWB: Cat I 3. Pain: analgesia prn 4. gHTN- stable 5. A2GDM- stable  Continue ripening, FB when able. Anticipate labor progression and SVD.  [redacted]w[redacted]d, CNM 9:54 PM

## 2019-11-20 NOTE — Progress Notes (Signed)
8335 External Cephalic Version not indicated, Presenting part vertex per bedside ultrasound per Dr. Crissie Reese.

## 2019-11-21 ENCOUNTER — Inpatient Hospital Stay (HOSPITAL_COMMUNITY): Payer: Medicaid Other | Admitting: Anesthesiology

## 2019-11-21 ENCOUNTER — Ambulatory Visit (HOSPITAL_COMMUNITY): Payer: Medicaid Other

## 2019-11-21 ENCOUNTER — Ambulatory Visit (HOSPITAL_COMMUNITY): Payer: Medicaid Other | Attending: Maternal & Fetal Medicine

## 2019-11-21 DIAGNOSIS — O411230 Chorioamnionitis, third trimester, not applicable or unspecified: Secondary | ICD-10-CM | POA: Diagnosis not present

## 2019-11-21 LAB — GLUCOSE, CAPILLARY
Glucose-Capillary: 107 mg/dL — ABNORMAL HIGH (ref 70–99)
Glucose-Capillary: 87 mg/dL (ref 70–99)
Glucose-Capillary: 88 mg/dL (ref 70–99)
Glucose-Capillary: 76 mg/dL (ref 70–99)
Glucose-Capillary: 103 mg/dL — ABNORMAL HIGH (ref 70–99)

## 2019-11-21 LAB — CBC
HCT: 34.5 % — ABNORMAL LOW (ref 36.0–46.0)
Hemoglobin: 11.7 g/dL — ABNORMAL LOW (ref 12.0–15.0)
MCH: 30.5 pg (ref 26.0–34.0)
MCHC: 33.9 g/dL (ref 30.0–36.0)
MCV: 90.1 fL (ref 80.0–100.0)
Platelets: 300 10*3/uL (ref 150–400)
RBC: 3.83 MIL/uL — ABNORMAL LOW (ref 3.87–5.11)
RDW: 13.7 % (ref 11.5–15.5)
WBC: 8.3 10*3/uL (ref 4.0–10.5)
nRBC: 0 % (ref 0.0–0.2)

## 2019-11-21 LAB — GC/CHLAMYDIA PROBE AMP (~~LOC~~) NOT AT ARMC
Chlamydia: NEGATIVE
Comment: NEGATIVE
Comment: NORMAL
Neisseria Gonorrhea: NEGATIVE

## 2019-11-21 MED ORDER — TERBUTALINE SULFATE 1 MG/ML IJ SOLN: 0.2500 mg | Freq: Once | INTRAMUSCULAR | Status: DC | PRN

## 2019-11-21 MED ORDER — LACTATED RINGERS IV SOLN: 500.0000 mL | Freq: Once | INTRAVENOUS | Status: DC

## 2019-11-21 MED ORDER — PHENYLEPHRINE 40 MCG/ML (10ML) SYRINGE FOR IV PUSH (FOR BLOOD PRESSURE SUPPORT): 80.0000 ug | PREFILLED_SYRINGE | INTRAVENOUS | Status: DC | PRN

## 2019-11-21 MED ORDER — OXYTOCIN 40 UNITS IN NORMAL SALINE INFUSION - SIMPLE MED
1.0000 m[IU]/min | INTRAVENOUS | Status: DC
  Administered 2019-11-21: 2 m[IU]/min via INTRAVENOUS
  Filled 2019-11-21: qty 1000

## 2019-11-21 MED ORDER — EPHEDRINE 5 MG/ML INJ: 10.0000 mg | INTRAVENOUS | Status: DC | PRN

## 2019-11-21 MED ORDER — LIDOCAINE HCL (PF) 1 % IJ SOLN
INTRAMUSCULAR | Status: DC | PRN
  Administered 2019-11-21: 5 mL via EPIDURAL
  Administered 2019-11-21 – 2019-11-23 (×3): 4 mL via EPIDURAL

## 2019-11-21 MED ORDER — FENTANYL-BUPIVACAINE-NACL 0.5-0.125-0.9 MG/250ML-% EP SOLN
12.0000 mL/h | EPIDURAL | Status: DC | PRN
  Administered 2019-11-22 – 2019-11-23 (×3): 12 mL/h via EPIDURAL
  Filled 2019-11-21 (×4): qty 250

## 2019-11-21 MED ORDER — PHENYLEPHRINE 40 MCG/ML (10ML) SYRINGE FOR IV PUSH (FOR BLOOD PRESSURE SUPPORT)
80.0000 ug | PREFILLED_SYRINGE | INTRAVENOUS | Status: DC | PRN
  Filled 2019-11-21: qty 10

## 2019-11-21 MED ORDER — SODIUM CHLORIDE (PF) 0.9 % IJ SOLN
INTRAMUSCULAR | Status: DC | PRN
  Administered 2019-11-21: 13 mL/h via EPIDURAL

## 2019-11-21 MED ORDER — MISOPROSTOL 25 MCG QUARTER TABLET
25.0000 ug | ORAL_TABLET | ORAL | Status: DC | PRN
  Administered 2019-11-21 – 2019-11-22 (×2): 25 ug via VAGINAL
  Filled 2019-11-21 (×2): qty 1

## 2019-11-21 MED ORDER — DIPHENHYDRAMINE HCL 50 MG/ML IJ SOLN: 12.5000 mg | INTRAMUSCULAR | Status: DC | PRN

## 2019-11-21 NOTE — Progress Notes (Signed)
LABOR PROGRESS NOTE  Denise Barker is a 34 y.o. G1P0 at [redacted]w[redacted]d presented for IOL for gHTN and A2GDM.  Subjective: Mild discomfort, painful contractions happening occasionally. Denies any headache, vision changes, abdominal pain, swelling in the arms and face.   Objective: BP 134/71   Pulse 85   Temp 97.8 F (36.6 C) (Oral)   Resp 18   Ht 5\' 10"  (1.778 m)   Wt (!) 140.6 kg   LMP 03/03/2019   BMI 44.48 kg/m    Dilation: 2.5(checking around foley bulb) Effacement (%): Thick Station: -3 Presentation: Vertex Exam by:: [email or web address], RN  Fetal monitoring: Baseline: 130 bpm, Variability: Good {> 6 bpm), Accelerations: Reactive, and Decelerations: Absent Uterine activity: Frequency: Occasional  Labs: Lab Results  Component Value Date   WBC 7.3 11/20/2019   HGB 11.2 (L) 11/20/2019   HCT 34.1 (L) 11/20/2019   MCV 90.2 11/20/2019   PLT 347 11/20/2019    Patient Active Problem List   Diagnosis Date Noted  . COVID-19 11/10/2019  . Gestational hypertension 10/02/2019  . Gestational diabetes 09/20/2019  . Nausea and vomiting in pregnancy 07/06/2019  . Obesity in pregnancy 06/01/2019  . Anxiety   . Depression affecting pregnancy   . Supervision of low-risk pregnancy   . Asthma 05/22/1995    Assessment / Plan: 07/22/1995 Denise Barker is a 34 y.o. G1P0 at [redacted]w[redacted]d presented for IOL for gHTN and A2GDM,  progressing well.  #Labor: Progressing normally Cytotec started @ 0330, X4, 3 PO and 1 Vaginal. S/p FB 1210. #Fetal Wellbeing:  Category I #Pain Control: Epidural planned, currently Fentanyl PRN #ID: GBS Negative #Anticipated MOD: NSVD #A2GDM-stable #gHTN- stable  [redacted]w[redacted]d, DO, PGY-1 Family Medicine Resident, [facility] Faculty Teaching Service  11/21/2019, 5:36 PM

## 2019-11-21 NOTE — Progress Notes (Signed)
Denise Barker is a 34 y.o. G1P0 at [redacted]w[redacted]d admitted for IOL 2/2 gHTN and A2GDM.  Subjective: Stopped feeling contractions about 30 minutes ago. Denies headache, vision changes, RUQ pain.  Objective: BP (!) 136/93   Pulse 89   Temp 98.5 F (36.9 C) (Oral)   Resp 20   Ht 5\' 10"  (1.778 m)   Wt (!) 140.6 kg   LMP 03/03/2019   BMI 44.48 kg/m  No intake/output data recorded.  FHT:  FHR: 135 bpm, variability: moderate,  accelerations:  Present,  decelerations:  Absent UC:   Not picked up by TOCO  SVE:   Dilation: 2.5(checking around foley bulb) Effacement (%): Thick Station: -3 Exam by:: [email or web address], RN   Labs: Lab Results  Component Value Date   WBC 8.3 11/21/2019   HGB 11.7 (L) 11/21/2019   HCT 34.5 (L) 11/21/2019   MCV 90.1 11/21/2019   PLT 300 11/21/2019    Assessment / Plan: 01/19/2020 [name] a 34 y.o.G1P0 at [redacted]w[redacted]d presented for IOL for gHTN and A2GDM.  #Labor: S/p cyto x8. S/p FB 1210. Bishop score now 8 with cervix 80-90% effaced. Will start pitocin. #Fetal Wellbeing:  Category I #Pain Control: per patient request #ID: GBS Negative #Anticipated MOD: vaginal #A2GDM- most recent CBG 103. Continue CBGs q4, q2h when active labor. #gHTN- No severe range pressures, most recent 136/93.  [redacted]w[redacted]d DO OB Fellow, Faculty Practice 11/21/2019, 9:49 PM

## 2019-11-21 NOTE — Progress Notes (Signed)
Labor Progress Note Denise Barker is a 35 y.o. G1P0 at 102w4d presented for IOL for gHTN and A2GDM  S:  Comfortable, eating meal. No c/o.   O:  BP (!) 145/71   Pulse 86   Temp (!) 97.4 F (36.3 C) (Axillary)   Resp 18   Ht 5\' 10"  (1.778 m)   Wt (!) 140.6 kg   LMP 03/03/2019   BMI 44.48 kg/m  EFM: baseline 145 bpm/ mod variability/ no accels/ no decels  Toco/IUPC: irritability SVE: Dilation: Fingertip Effacement (%): 50 Station: -3 Presentation: Vertex Exam by:: Barnicle RN CBG- 83  A/P: 34 y.o. G1P0 [redacted]w[redacted]d  1. Labor: latent 2. FWB: Cat I 3. Pain: analgesia prn 4. gHTN- stable 5. A2GDM-stable  Continue Cytotec until able to place FB. Anticipate SVD.  [redacted]w[redacted]d, CNM 5:42 AM

## 2019-11-21 NOTE — Anesthesia Preprocedure Evaluation (Addendum)
Anesthesia Evaluation  Patient identified by MRN, date of birth, ID band Patient awake    Reviewed: Allergy & Precautions, Patient's Chart, lab work & pertinent test results  Airway Mallampati: III  TM Distance: >3 FB Neck ROM: Full    Dental no notable dental hx. (+) Teeth Intact   Pulmonary asthma , Patient abstained from smoking.,  Covid + 11/09/19 Loss of sense of taste / smell   Pulmonary exam normal breath sounds clear to auscultation       Cardiovascular hypertension, Normal cardiovascular exam Rhythm:Regular Rate:Normal     Neuro/Psych PSYCHIATRIC DISORDERS Anxiety Depression negative neurological ROS     GI/Hepatic Neg liver ROS, GERD  ,  Endo/Other  diabetes, Well Controlled, Gestational, Oral Hypoglycemic AgentsMorbid obesity  Renal/GU negative Renal ROS  negative genitourinary   Musculoskeletal negative musculoskeletal ROS (+)   Abdominal (+) + obese,   Peds  Hematology  (+) anemia ,   Anesthesia Other Findings   Reproductive/Obstetrics (+) Pregnancy                            Anesthesia Physical Anesthesia Plan  ASA: III and emergent  Anesthesia Plan: Epidural   Post-op Pain Management:    Induction:   PONV Risk Score and Plan:   Airway Management Planned: Natural Airway  Additional Equipment:   Intra-op Plan:   Post-operative Plan:   Informed Consent: I have reviewed the patients History and Physical, chart, labs and discussed the procedure including the risks, benefits and alternatives for the proposed anesthesia with the patient or authorized representative who has indicated his/her understanding and acceptance.       Plan Discussed with: Anesthesiologist  Anesthesia Plan Comments: (Patient for C/section for failure to progress. She has been in labor for greater than 48 hours with very slow cervical change.Epidural catheter was replaced x 2 today. Will  attempt to use epidural catheter. If it is not working will perform SAB. Helmut Muster, MD)       Anesthesia Quick Evaluation

## 2019-11-21 NOTE — Progress Notes (Signed)
Patient sitting high fowler's to eat breakfast. Okay per Dr. Crissie Reese to remove monitors while eating breakfast.

## 2019-11-21 NOTE — Progress Notes (Signed)
Labor Progress Note Denise Barker is a 34 y.o. G1P0 at [redacted]w[redacted]d presented for IOL for gHTN and A2GDM  S:  Comfortable, not feeling pain or ctx.  O:  BP 126/65   Pulse 89   Temp 98.1 F (36.7 C) (Oral)   Resp 18   Ht 5\' 10"  (1.778 m)   Wt (!) 140.6 kg   LMP 03/03/2019   BMI 44.48 kg/m  EFM: baseline 135 bpm/ mod variability/ + accels/ no decels  Toco/IUPC: indeterminable SVE: closed/thick CBG-87  A/P: 34 y.o. G1P0 [redacted]w[redacted]d  1. Labor: latent 2. FWB: Cat I 3. Pain: analgesia prn 4. gHTN- stable 5. A2GDM-stable  Continue ripening with Cytotec, FB when able. Anticipate SVD.  [redacted]w[redacted]d, CNM 7:26 AM

## 2019-11-21 NOTE — Anesthesia Procedure Notes (Signed)
Epidural Patient location during procedure: OB Start time: 11/21/2019 10:22 PM End time: 11/21/2019 10:30 PM  Staffing Anesthesiologist: Mal Amabile, MD Performed: anesthesiologist   Preanesthetic Checklist Completed: patient identified, IV checked, site marked, risks and benefits discussed, surgical consent, monitors and equipment checked, pre-op evaluation and timeout performed  Epidural Patient position: sitting Prep: DuraPrep and site prepped and draped Patient monitoring: continuous pulse ox and blood pressure Approach: midline Location: L4-L5 Injection technique: LOR air  Needle:  Needle type: Tuohy  Needle gauge: 17 G Needle length: 9 cm and 9 Needle insertion depth: 9 cm Catheter type: closed end flexible Catheter size: 19 Gauge Catheter at skin depth: 15 cm Test dose: negative and Other  Assessment Events: blood not aspirated, injection not painful, no injection resistance, no paresthesia and negative IV test  Additional Notes Patient identified. Risks and benefits discussed including failed block, incomplete  Pain control, post dural puncture headache, nerve damage, paralysis, blood pressure Changes, nausea, vomiting, reactions to medications-both toxic and allergic and post Partum back pain. All questions were answered. Patient expressed understanding and wished to proceed. Sterile technique was used throughout procedure. Epidural site was Dressed with sterile barrier dressing. No paresthesias, signs of intravascular injection Or signs of intrathecal spread were encountered.  Patient was more comfortable after the epidural was dosed. Please see RN's note for documentation of vital signs and FHR which are stable. Reason for block:procedure for pain

## 2019-11-21 NOTE — Progress Notes (Signed)
LABOR PROGRESS NOTE  Denise Barker is a 34 y.o. G1P0 at [redacted]w[redacted]d presented for IOL for gHTN and A2GDM.  Subjective: Comfortable, occasional contractions.  Agreeable for FB placement with speculum.  Objective: BP (!) 142/83   Pulse 91   Temp 98.3 F (36.8 C) (Oral)   Resp 18   Ht 5\' 10"  (1.778 m)   Wt (!) 140.6 kg   LMP 03/03/2019   BMI 44.48 kg/m    Dilation: Closed Effacement (%): Thick Station: -3 Presentation: Vertex Exam by:: Dr [email or web address] Fetal monitoring: Baseline: 140 bpm, Variability: Good {> 6 bpm), Accelerations: Reactive, and Decelerations: Absent Uterine activity: Frequency: None  Labs: Lab Results  Component Value Date   WBC 7.3 11/20/2019   HGB 11.2 (L) 11/20/2019   HCT 34.1 (L) 11/20/2019   MCV 90.2 11/20/2019   PLT 347 11/20/2019    Patient Active Problem List   Diagnosis Date Noted  . COVID-19 11/10/2019  . Gestational hypertension 10/02/2019  . Gestational diabetes 09/20/2019  . Nausea and vomiting in pregnancy 07/06/2019  . Obesity in pregnancy 06/01/2019  . Anxiety   . Depression affecting pregnancy   . Supervision of low-risk pregnancy   . Asthma 05/22/1995    Assessment / Plan: 07/22/1995 Denise Barker is a 34 y.o. G1P0 at [redacted]w[redacted]d presented for IOL for gHTN and A2GDM,  progressing well.  #Labor: Progressing normally Cytotec started @ 0330. S/p FB 1210. #Fetal Wellbeing:  Category I #Pain Control: Epidural #ID: GBS Negative #Anticipated MOD: NSVD   [redacted]w[redacted]d, DO, PGY-1 Family Medicine Resident, [facility] Faculty Teaching Service  11/21/2019, 12:56 PM

## 2019-11-22 DIAGNOSIS — O411230 Chorioamnionitis, third trimester, not applicable or unspecified: Secondary | ICD-10-CM | POA: Diagnosis not present

## 2019-11-22 LAB — GLUCOSE, CAPILLARY
Glucose-Capillary: 102 mg/dL — ABNORMAL HIGH (ref 70–99)
Glucose-Capillary: 106 mg/dL — ABNORMAL HIGH (ref 70–99)
Glucose-Capillary: 64 mg/dL — ABNORMAL LOW (ref 70–99)
Glucose-Capillary: 82 mg/dL (ref 70–99)
Glucose-Capillary: 87 mg/dL (ref 70–99)
Glucose-Capillary: 92 mg/dL (ref 70–99)
Glucose-Capillary: 93 mg/dL (ref 70–99)

## 2019-11-22 LAB — CBC
HCT: 35.1 % — ABNORMAL LOW (ref 36.0–46.0)
Hemoglobin: 11.7 g/dL — ABNORMAL LOW (ref 12.0–15.0)
MCH: 30.2 pg (ref 26.0–34.0)
MCHC: 33.3 g/dL (ref 30.0–36.0)
MCV: 90.7 fL (ref 80.0–100.0)
Platelets: 301 10*3/uL (ref 150–400)
RBC: 3.87 MIL/uL (ref 3.87–5.11)
RDW: 13.9 % (ref 11.5–15.5)
WBC: 10.2 10*3/uL (ref 4.0–10.5)
nRBC: 0 % (ref 0.0–0.2)

## 2019-11-22 LAB — COMPREHENSIVE METABOLIC PANEL
ALT: 16 U/L (ref 0–44)
AST: 18 U/L (ref 15–41)
Albumin: 2.1 g/dL — ABNORMAL LOW (ref 3.5–5.0)
Alkaline Phosphatase: 124 U/L (ref 38–126)
Anion gap: 11 (ref 5–15)
BUN: <5 mg/dL — ABNORMAL LOW (ref 6–20)
CO2: 23 mmol/L (ref 22–32)
Calcium: 9.2 mg/dL (ref 8.9–10.3)
Chloride: 104 mmol/L (ref 98–111)
Creatinine, Ser: 0.73 mg/dL (ref 0.44–1.00)
GFR calc Af Amer: >60 mL/min (ref >60)
GFR calc non Af Amer: >60 mL/min (ref >60)
Glucose, Bld: 141 mg/dL — ABNORMAL HIGH (ref 70–99)
Potassium: 3.5 mmol/L (ref 3.5–5.1)
Sodium: 138 mmol/L (ref 135–145)
Total Bilirubin: 0.9 mg/dL (ref 0.3–1.2)
Total Protein: 5.6 g/dL — ABNORMAL LOW (ref 6.5–8.1)

## 2019-11-22 MED ORDER — LABETALOL HCL 5 MG/ML IV SOLN: 40.0000 mg | INTRAVENOUS | Status: DC | PRN

## 2019-11-22 MED ORDER — SODIUM CHLORIDE 0.9 % IV SOLN
1.0000 m[IU]/min | INTRAVENOUS | Status: DC
  Administered 2019-11-23: 4 m[IU]/min via INTRAVENOUS
  Filled 2019-11-22: qty 4

## 2019-11-22 MED ORDER — HYDRALAZINE HCL 20 MG/ML IJ SOLN: 10.0000 mg | INTRAMUSCULAR | Status: DC | PRN

## 2019-11-22 MED ORDER — MAGNESIUM SULFATE BOLUS VIA INFUSION
4.0000 g | Freq: Once | INTRAVENOUS | Status: AC
  Administered 2019-11-22: 4 g via INTRAVENOUS
  Filled 2019-11-22: qty 1000

## 2019-11-22 MED ORDER — LIDOCAINE-EPINEPHRINE (PF) 2 %-1:200000 IJ SOLN
INTRAMUSCULAR | Status: DC | PRN
  Administered 2019-11-22: 6 mL via EPIDURAL
  Administered 2019-11-22: 5 mL via EPIDURAL
  Administered 2019-11-22: 4 mL via EPIDURAL
  Administered 2019-11-23: 3 mL via EPIDURAL

## 2019-11-22 MED ORDER — LABETALOL HCL 5 MG/ML IV SOLN
20.0000 mg | INTRAVENOUS | Status: DC | PRN
  Administered 2019-11-22 – 2019-11-23 (×2): 20 mg via INTRAVENOUS
  Filled 2019-11-22 (×2): qty 4

## 2019-11-22 MED ORDER — LABETALOL HCL 5 MG/ML IV SOLN: 80.0000 mg | INTRAVENOUS | Status: DC | PRN

## 2019-11-22 MED ORDER — LABETALOL HCL 5 MG/ML IV SOLN
INTRAVENOUS | Status: AC
  Filled 2019-11-22: qty 4

## 2019-11-22 MED ORDER — LABETALOL HCL 5 MG/ML IV SOLN
20.0000 mg | INTRAVENOUS | Status: DC | PRN
  Administered 2019-11-22: 20 mg via INTRAVENOUS

## 2019-11-22 MED ORDER — MAGNESIUM SULFATE 40 GM/1000ML IV SOLN
2.0000 g/h | INTRAVENOUS | Status: DC
  Administered 2019-11-22 – 2019-11-23 (×2): 2 g/h via INTRAVENOUS
  Filled 2019-11-22 (×2): qty 1000

## 2019-11-22 NOTE — Progress Notes (Signed)
Denise Barker is a 34 y.o. G1P0 at [redacted]w[redacted]d admitted for IOL 2/2 gHTN and A2GDM.  Subjective: Patient sleeping  Objective: BP 135/70   Pulse 87   Temp 98.5 F (36.9 C) (Oral)   Resp 20   Ht 5\' 10"  (1.778 m)   Wt (!) 140.6 kg   LMP 03/03/2019   SpO2 96%   BMI 44.48 kg/m  Total I/O In: -  Out: 1450 [Urine:1450]  FHT:  FHR: 130 bpm, variability: moderate,  accelerations:  Present,  decelerations:  Absent UC:   regular, every 4-5 minutes  SVE:   Dilation: 4.5 Effacement (%): 80 Station: -2 Exam by:: [email or web address], RN   Pitocin @ 10 mu/min  Labs: Lab Results  Component Value Date   WBC 8.3 11/21/2019   HGB 11.7 (L) 11/21/2019   HCT 34.5 (L) 11/21/2019   MCV 90.1 11/21/2019   PLT 300 11/21/2019    Assessment / Plan: 01/19/2020 [name] a 34 y.o.G1P0 at [redacted]w[redacted]d presented for IOL for gHTN and A2GDM.  #Labor:S/p cyto x8.S/p FB out@0234 .Pitocin @10  (started at 2206 last night), continue to titrate. Consider AROM with IUPC when appropriate. #Fetal Wellbeing: Category I #Pain Control:comfortable with epidural #ID: GBS Negative #Anticipated , CS as appropriate #A2GDM-most recent CBG 93. Continue CBGs q4, q2h when active labor. #gHTN-No severe range pressures, most recent 135/70.  2207 DO OB Fellow, Faculty Practice 11/22/2019, 4:16 AM

## 2019-11-22 NOTE — Progress Notes (Signed)
Denise Barker is a 34 y.o. G1P0 at [redacted]w[redacted]d admitted for IOL 2/2 gHTN and A2GDM.  Subjective: Comfortable with epidural.   Objective: BP 136/72   Pulse 96   Temp 99 F (37.2 C) (Oral)   Resp 20   Ht 5\' 10"  (1.778 m)   Wt (!) 140.6 kg   LMP 03/03/2019   SpO2 96%   BMI 44.48 kg/m  No intake/output data recorded.  FHT:  FHR: 135 bpm, variability: moderate,  accelerations:  Present,  decelerations:  Absent UC:   Unable to be traced on TOCO  SVE:   Dilation: 2.5 Effacement (%): 80 Station: -3 Exam by:: DR Ninah Moccio   Pitocin @ 6 mu/min  Labs: Lab Results  Component Value Date   WBC 8.3 11/21/2019   HGB 11.7 (L) 11/21/2019   HCT 34.5 (L) 11/21/2019   MCV 90.1 11/21/2019   PLT 300 11/21/2019    Assessment / Plan: 01/19/2020 [name] a 34 y.o.G1P0 at [redacted]w[redacted]d presented for IOL for gHTN and A2GDM.  #Labor:S/p cyto x8.S/p FB 1210. Pitocin @6  (started at 2206), continue to titrate. Plan to reassess when FB is out. #Fetal Wellbeing: Category I #Pain Control: comfortable with epidural #ID: GBS Negative #Anticipated MOD: vaginal, CS as appropriate #A2GDM- most recent CBG 103. Continue CBGs q4, q2h when active labor. #gHTN- No severe range pressures, most recent 136/72.  DO OB Fellow, Faculty Practice 11/22/2019, 1:39 AM

## 2019-11-22 NOTE — Progress Notes (Signed)
Denise Barker is a 34 y.o. G1P0 at [redacted]w[redacted]d admitted for IOL 2/2 gHTN and A2GDM.  Subjective: Now more comfortable after epidural re-dosing.  Objective: BP 124/71   Pulse 96   Temp 98.1 F (36.7 C) (Axillary)   Resp 18   Ht 5\' 10"  (1.778 m)   Wt (!) 140.6 kg   LMP 03/03/2019   SpO2 96%   BMI 44.48 kg/m  Total I/O In: -  Out: 775 [Urine:775]  FHT:  FHR: 145 bpm, variability: moderate,  accelerations:  Present,  decelerations:  Absent UC:   irregular, every 4-9 minutes, MVUs not adequate  SVE:   Dilation: 4.5 Effacement (%): 50 Station: -2 Exam by:: [email or web address] RN  Labs: Lab Results  Component Value Date   WBC 10.2 11/22/2019   HGB 11.7 (L) 11/22/2019   HCT 35.1 (L) 11/22/2019   MCV 90.7 11/22/2019   PLT 301 11/22/2019    Assessment / Plan: 01/20/2020 Denise Barker a 34 y.o.G1P0 at [redacted]w[redacted]d here for IOL for gHTN and A2GDM.  #Labor:S/p cyto x8.S/p FB out@0234 .Pitocin @30  (started at 2206 last night), has been on Pitocin for almost 24 hours without much cervical change. AROM @0910  with IUPC placement, MVUs not adequate. 9th cytotec just placed vaginally at this check. Will do a pit break for 4 hours, then resume. #Preeclampsia now with severe features by BP. On Mag. Most recent BP 124/71. #Fetal Wellbeing: Category I #Pain Control:comfortable with epidural #ID: GBS Negative #Anticipated , CS as appropriate #A2GDM-most recent CBG 106. Continue CBGs q4, q2h when active labor.  2207 DO OB Fellow, Faculty Practice 11/22/2019, 8:49 PM

## 2019-11-22 NOTE — Progress Notes (Signed)
Jericha Bejar is a 34 y.o. G1P0 at [redacted]w[redacted]d admitted for IOL 2/2 gHTN and A2GDM.  Subjective: Patient with elevated blood pressures, sustained for over an hour.  Preeclampsia labs ordered.  She denies any symptoms, including headache, vision changes, right upper quadrant pain, swelling, shortness of breath or chest pain.  Objective: BP (!) 147/68   Pulse 86   Temp (!) 97.5 F (36.4 C) (Axillary)   Resp 18   Ht 5\' 10"  (1.778 m)   Wt (!) 140.6 kg   LMP 03/03/2019   SpO2 96%   BMI 44.48 kg/m  Total I/O In: -  Out: 1000 [Urine:1000]  FHT:  FHR: 140 bpm, variability: moderate,  accelerations:  Present,  decelerations:  Absent UC:   regular, every 2-3 minutes  SVE:   Dilation: 4.5 Effacement (%): 50 Station: -2 Exam by:: [email or web address] RN  Pitocin @ 30 mu/min  Labs: Lab Results  Component Value Date   WBC 8.3 11/21/2019   HGB 11.7 (L) 11/21/2019   HCT 34.5 (L) 11/21/2019   MCV 90.1 11/21/2019   PLT 300 11/21/2019    Assessment / Plan: 01/19/2020 [name] a 34 y.o.G1P0 at [redacted]w[redacted]d presented for IOL for gHTN and A2GDM.  #Labor:S/p cyto x8.S/p FB out@0234 .Pitocin @30  (started at 2206 last night), continue to titrate. AROM @ 0910 with IUPC. #Preeclampsia without severe features: Preeclampsia labs ordered and pending.  IV labetalol x1, magnesium orders placed. Most recent BP of 147/68 #Fetal Wellbeing: Category I #Pain Control:comfortable with epidural #ID: GBS Negative #Anticipated , CS as appropriate #A2GDM-most recent CBG 82. Continue CBGs q4, q2h when active labor.  2207 Bernadean Saling DO OB Fellow, Faculty Practice 11/22/2019, 2:46 PM

## 2019-11-22 NOTE — Progress Notes (Signed)
Pt CBG 64. Gave pt cranberry juice. Will recheck in 30 mins.

## 2019-11-22 NOTE — Progress Notes (Signed)
Denise Barker is a 34 y.o. G1P0 at [redacted]w[redacted]d admitted for IOL 2/2 gHTN and A2GDM.  Subjective: Patient slightly uncomfortable, on Pitocin for 12 hours. Agreeable to AROM.  Objective: BP 108/75   Pulse 92   Temp 98.7 F (37.1 C) (Oral)   Resp 18   Ht 5\' 10"  (1.778 m)   Wt (!) 140.6 kg   LMP 03/03/2019   SpO2 96%   BMI 44.48 kg/m  No intake/output data recorded.  FHT:  FHR: 140 bpm, variability: moderate,  accelerations:  Present,  decelerations:  Absent UC:   regular, every 3-4 minutes  SVE:   Dilation: 4.5 Effacement (%): 50 Station: -2 Exam by:: Dr. [email or web address]  Pitocin @ 18 mu/min  Labs: Lab Results  Component Value Date   WBC 8.3 11/21/2019   HGB 11.7 (L) 11/21/2019   HCT 34.5 (L) 11/21/2019   MCV 90.1 11/21/2019   PLT 300 11/21/2019    Assessment / Plan: 01/19/2020 Denise Barker a 34 y.o.G1P0 at [redacted]w[redacted]d presented for IOL for gHTN and A2GDM.  #Labor:S/p cyto x8.S/p FB out@0234 .Pitocin @18  (started at 2206 last night), continue to titrate. AROM @ 0910 with IUPC. #Fetal Wellbeing: Category I #Pain Control:comfortable with epidural #ID: GBS Negative #Anticipated , CS as appropriate #A2GDM-most recent CBG 87. Continue CBGs q4, q2h when active labor. #gHTN-No severe range pressures, most recent 108/75.  2207 Denise Castillo DO OB Fellow, Faculty Practice 11/22/2019, 9:50 AM

## 2019-11-23 ENCOUNTER — Encounter (HOSPITAL_COMMUNITY)
Admission: AD | Disposition: A | Discharge status: Home / Self Care | Payer: Self-pay | Source: Home / Self Care | Attending: Obstetrics and Gynecology

## 2019-11-23 ENCOUNTER — Encounter (HOSPITAL_COMMUNITY): Payer: Self-pay | Admitting: Obstetrics & Gynecology

## 2019-11-23 DIAGNOSIS — Z3A37 37 weeks gestation of pregnancy: Secondary | ICD-10-CM

## 2019-11-23 DIAGNOSIS — O411230 Chorioamnionitis, third trimester, not applicable or unspecified: Secondary | ICD-10-CM

## 2019-11-23 DIAGNOSIS — O24424 Gestational diabetes mellitus in childbirth, insulin controlled: Secondary | ICD-10-CM

## 2019-11-23 DIAGNOSIS — F329 Major depressive disorder, single episode, unspecified: Secondary | ICD-10-CM

## 2019-11-23 DIAGNOSIS — O134 Gestational [pregnancy-induced] hypertension without significant proteinuria, complicating childbirth: Secondary | ICD-10-CM

## 2019-11-23 DIAGNOSIS — O1414 Severe pre-eclampsia complicating childbirth: Secondary | ICD-10-CM

## 2019-11-23 DIAGNOSIS — O99344 Other mental disorders complicating childbirth: Secondary | ICD-10-CM

## 2019-11-23 LAB — CBC
HCT: 34.3 % — ABNORMAL LOW (ref 36.0–46.0)
Hemoglobin: 11.6 g/dL — ABNORMAL LOW (ref 12.0–15.0)
MCH: 30.2 pg (ref 26.0–34.0)
MCHC: 33.8 g/dL (ref 30.0–36.0)
MCV: 89.3 fL (ref 80.0–100.0)
Platelets: 316 10*3/uL (ref 150–400)
RBC: 3.84 MIL/uL — ABNORMAL LOW (ref 3.87–5.11)
RDW: 14.1 % (ref 11.5–15.5)
WBC: 14.1 10*3/uL — ABNORMAL HIGH (ref 4.0–10.5)
nRBC: 0 % (ref 0.0–0.2)

## 2019-11-23 LAB — CBC WITH DIFFERENTIAL/PLATELET
Abs Immature Granulocytes: 0.14 10*3/uL — ABNORMAL HIGH (ref 0.00–0.07)
Basophils Absolute: 0 10*3/uL (ref 0.0–0.1)
Basophils Relative: 0 %
Eosinophils Absolute: 0 10*3/uL (ref 0.0–0.5)
Eosinophils Relative: 0 %
HCT: 33.3 % — ABNORMAL LOW (ref 36.0–46.0)
Hemoglobin: 11.2 g/dL — ABNORMAL LOW (ref 12.0–15.0)
Immature Granulocytes: 1 %
Lymphocytes Relative: 3 %
Lymphs Abs: 0.6 10*3/uL — ABNORMAL LOW (ref 0.7–4.0)
MCH: 30 pg (ref 26.0–34.0)
MCHC: 33.6 g/dL (ref 30.0–36.0)
MCV: 89.3 fL (ref 80.0–100.0)
Monocytes Absolute: 2 10*3/uL — ABNORMAL HIGH (ref 0.1–1.0)
Monocytes Relative: 10 %
Neutro Abs: 18.1 10*3/uL — ABNORMAL HIGH (ref 1.7–7.7)
Neutrophils Relative %: 86 %
Platelets: 318 10*3/uL (ref 150–400)
RBC: 3.73 MIL/uL — ABNORMAL LOW (ref 3.87–5.11)
RDW: 14.3 % (ref 11.5–15.5)
WBC: 20.9 10*3/uL — ABNORMAL HIGH (ref 4.0–10.5)
nRBC: 0 % (ref 0.0–0.2)

## 2019-11-23 LAB — COMPREHENSIVE METABOLIC PANEL
ALT: 17 U/L (ref 0–44)
AST: 16 U/L (ref 15–41)
Albumin: 2.4 g/dL — ABNORMAL LOW (ref 3.5–5.0)
Alkaline Phosphatase: 143 U/L — ABNORMAL HIGH (ref 38–126)
Anion gap: 10 (ref 5–15)
BUN: <5 mg/dL — ABNORMAL LOW (ref 6–20)
CO2: 22 mmol/L (ref 22–32)
Calcium: 8.2 mg/dL — ABNORMAL LOW (ref 8.9–10.3)
Chloride: 103 mmol/L (ref 98–111)
Creatinine, Ser: 0.74 mg/dL (ref 0.44–1.00)
GFR calc Af Amer: >60 mL/min (ref >60)
GFR calc non Af Amer: >60 mL/min (ref >60)
Glucose, Bld: 113 mg/dL — ABNORMAL HIGH (ref 70–99)
Potassium: 3.6 mmol/L (ref 3.5–5.1)
Sodium: 135 mmol/L (ref 135–145)
Total Bilirubin: 1.2 mg/dL (ref 0.3–1.2)
Total Protein: 6.1 g/dL — ABNORMAL LOW (ref 6.5–8.1)

## 2019-11-23 LAB — GLUCOSE, CAPILLARY
Glucose-Capillary: 91 mg/dL (ref 70–99)
Glucose-Capillary: 110 mg/dL — ABNORMAL HIGH (ref 70–99)
Glucose-Capillary: 124 mg/dL — ABNORMAL HIGH (ref 70–99)
Glucose-Capillary: 143 mg/dL — ABNORMAL HIGH (ref 70–99)
Glucose-Capillary: 136 mg/dL — ABNORMAL HIGH (ref 70–99)

## 2019-11-23 LAB — MAGNESIUM: Magnesium: 4.2 mg/dL — ABNORMAL HIGH (ref 1.7–2.4)

## 2019-11-23 SURGERY — Surgical Case
Anesthesia: Epidural | Wound class: Clean Contaminated

## 2019-11-23 MED ORDER — ONDANSETRON HCL 4 MG/2ML IJ SOLN: 4.0000 mg | Freq: Three times a day (TID) | INTRAMUSCULAR | Status: DC | PRN

## 2019-11-23 MED ORDER — LIDOCAINE-EPINEPHRINE (PF) 2 %-1:200000 IJ SOLN
INTRAMUSCULAR | Status: AC
  Filled 2019-11-23: qty 20

## 2019-11-23 MED ORDER — KETOROLAC TROMETHAMINE 30 MG/ML IJ SOLN
30.0000 mg | Freq: Four times a day (QID) | INTRAMUSCULAR | Status: AC | PRN
  Administered 2019-11-23: 30 mg via INTRAMUSCULAR

## 2019-11-23 MED ORDER — FENTANYL CITRATE (PF) 100 MCG/2ML IJ SOLN
25.0000 ug | INTRAMUSCULAR | Status: DC | PRN
  Administered 2019-11-23: 50 ug via INTRAVENOUS

## 2019-11-23 MED ORDER — MORPHINE SULFATE (PF) 0.5 MG/ML IJ SOLN
INTRAMUSCULAR | Status: AC
  Filled 2019-11-23: qty 10

## 2019-11-23 MED ORDER — SENNOSIDES-DOCUSATE SODIUM 8.6-50 MG PO TABS
2.0000 | ORAL_TABLET | ORAL | Status: DC
  Administered 2019-11-24 – 2019-11-26 (×3): 2 via ORAL
  Filled 2019-11-23 (×3): qty 2

## 2019-11-23 MED ORDER — COCONUT OIL OIL: 1.0000 "application " | TOPICAL_OIL | Status: DC | PRN

## 2019-11-23 MED ORDER — SCOPOLAMINE 1 MG/3DAYS TD PT72
MEDICATED_PATCH | TRANSDERMAL | Status: AC
  Filled 2019-11-23: qty 1

## 2019-11-23 MED ORDER — SIMETHICONE 80 MG PO CHEW
80.0000 mg | CHEWABLE_TABLET | ORAL | Status: DC
  Administered 2019-11-24 – 2019-11-26 (×3): 80 mg via ORAL
  Filled 2019-11-23 (×3): qty 1

## 2019-11-23 MED ORDER — GENTAMICIN SULFATE 40 MG/ML IJ SOLN
5.0000 mg/kg | INTRAVENOUS | Status: DC
  Administered 2019-11-23: 490 mg via INTRAVENOUS
  Filled 2019-11-23 (×3): qty 12.25

## 2019-11-23 MED ORDER — WITCH HAZEL-GLYCERIN EX PADS: 1.0000 "application " | MEDICATED_PAD | CUTANEOUS | Status: DC | PRN

## 2019-11-23 MED ORDER — OXYTOCIN 40 UNITS IN NORMAL SALINE INFUSION - SIMPLE MED
INTRAVENOUS | Status: AC
  Filled 2019-11-23: qty 1000

## 2019-11-23 MED ORDER — CLINDAMYCIN PHOSPHATE 900 MG/50ML IV SOLN
900.0000 mg | Freq: Three times a day (TID) | INTRAVENOUS | Status: AC
  Administered 2019-11-24 (×3): 900 mg via INTRAVENOUS
  Filled 2019-11-23 (×3): qty 50

## 2019-11-23 MED ORDER — INSULIN ASPART 100 UNIT/ML ~~LOC~~ SOLN
1.0000 [IU] | Freq: Once | SUBCUTANEOUS | Status: AC
  Administered 2019-11-23: 1 [IU] via SUBCUTANEOUS

## 2019-11-23 MED ORDER — SCOPOLAMINE 1 MG/3DAYS TD PT72
1.0000 | MEDICATED_PATCH | Freq: Once | TRANSDERMAL | Status: DC
  Administered 2019-11-23: 1.5 mg via TRANSDERMAL

## 2019-11-23 MED ORDER — SODIUM CHLORIDE 0.9 % IR SOLN
Status: DC | PRN
  Administered 2019-11-23: 1

## 2019-11-23 MED ORDER — GENTAMICIN SULFATE 40 MG/ML IJ SOLN
5.0000 mg/kg | INTRAVENOUS | Status: AC
  Administered 2019-11-24: 490 mg via INTRAVENOUS
  Filled 2019-11-23: qty 12.25

## 2019-11-23 MED ORDER — MEPERIDINE HCL 25 MG/ML IJ SOLN: 6.2500 mg | INTRAMUSCULAR | Status: DC | PRN

## 2019-11-23 MED ORDER — OXYTOCIN 40 UNITS IN NORMAL SALINE INFUSION - SIMPLE MED
2.5000 [IU]/h | INTRAVENOUS | Status: AC
  Administered 2019-11-23: 2.5 [IU]/h via INTRAVENOUS

## 2019-11-23 MED ORDER — OXYTOCIN 40 UNITS IN NORMAL SALINE INFUSION - SIMPLE MED
INTRAVENOUS | Status: DC | PRN
  Administered 2019-11-23: 500 mL via INTRAVENOUS

## 2019-11-23 MED ORDER — ENOXAPARIN SODIUM 80 MG/0.8ML ~~LOC~~ SOLN
70.0000 mg | SUBCUTANEOUS | Status: DC
  Administered 2019-11-24 – 2019-11-26 (×3): 70 mg via SUBCUTANEOUS
  Filled 2019-11-23 (×3): qty 0.8

## 2019-11-23 MED ORDER — SODIUM CHLORIDE 0.9 % IV SOLN
2.0000 g | Freq: Four times a day (QID) | INTRAVENOUS | Status: AC
  Administered 2019-11-24 (×3): 2 g via INTRAVENOUS
  Filled 2019-11-23 (×3): qty 2000

## 2019-11-23 MED ORDER — SODIUM CHLORIDE 0.9% FLUSH: 3.0000 mL | INTRAVENOUS | Status: DC | PRN

## 2019-11-23 MED ORDER — FENTANYL CITRATE (PF) 100 MCG/2ML IJ SOLN
INTRAMUSCULAR | Status: AC
  Filled 2019-11-23: qty 2

## 2019-11-23 MED ORDER — LACTATED RINGERS IV SOLN: INTRAVENOUS | Status: DC

## 2019-11-23 MED ORDER — NALOXONE HCL 0.4 MG/ML IJ SOLN: 0.4000 mg | INTRAMUSCULAR | Status: DC | PRN

## 2019-11-23 MED ORDER — KETOROLAC TROMETHAMINE 30 MG/ML IJ SOLN
INTRAMUSCULAR | Status: AC
  Filled 2019-11-23: qty 1

## 2019-11-23 MED ORDER — PRENATAL MULTIVITAMIN CH
1.0000 | ORAL_TABLET | Freq: Every day | ORAL | Status: DC
  Administered 2019-11-24 – 2019-11-26 (×3): 1 via ORAL
  Filled 2019-11-23 (×3): qty 1

## 2019-11-23 MED ORDER — DEXTROSE 5 % IV SOLN
INTRAVENOUS | Status: AC
  Filled 2019-11-23: qty 3000

## 2019-11-23 MED ORDER — NALOXONE HCL 4 MG/10ML IJ SOLN
1.0000 ug/kg/h | INTRAVENOUS | Status: DC | PRN
  Filled 2019-11-23: qty 5

## 2019-11-23 MED ORDER — SODIUM CHLORIDE 0.9 % IV SOLN: 500.0000 mg | INTRAVENOUS | Status: DC

## 2019-11-23 MED ORDER — NALBUPHINE HCL 10 MG/ML IJ SOLN: 5.0000 mg | INTRAMUSCULAR | Status: DC | PRN

## 2019-11-23 MED ORDER — IBUPROFEN 800 MG PO TABS
800.0000 mg | ORAL_TABLET | Freq: Three times a day (TID) | ORAL | Status: DC
  Administered 2019-11-24 – 2019-11-26 (×6): 800 mg via ORAL
  Filled 2019-11-23 (×7): qty 1

## 2019-11-23 MED ORDER — SODIUM CHLORIDE 0.9 % IV SOLN
2.0000 g | Freq: Four times a day (QID) | INTRAVENOUS | Status: DC
  Administered 2019-11-23 (×3): 2 g via INTRAVENOUS
  Filled 2019-11-23 (×3): qty 2000

## 2019-11-23 MED ORDER — SIMETHICONE 80 MG PO CHEW: 80.0000 mg | CHEWABLE_TABLET | ORAL | Status: DC | PRN

## 2019-11-23 MED ORDER — MENTHOL 3 MG MT LOZG: 1.0000 | LOZENGE | OROMUCOSAL | Status: DC | PRN

## 2019-11-23 MED ORDER — HYDROCODONE-ACETAMINOPHEN 5-325 MG PO TABS: 1.0000 | ORAL_TABLET | ORAL | Status: DC | PRN

## 2019-11-23 MED ORDER — MAGNESIUM SULFATE 40 GM/1000ML IV SOLN
2.0000 g/h | INTRAVENOUS | Status: AC
  Filled 2019-11-23: qty 1000

## 2019-11-23 MED ORDER — ACETAMINOPHEN 500 MG PO TABS
1000.0000 mg | ORAL_TABLET | Freq: Once | ORAL | Status: AC
  Administered 2019-11-23: 1000 mg via ORAL
  Filled 2019-11-23: qty 2

## 2019-11-23 MED ORDER — DIPHENHYDRAMINE HCL 25 MG PO CAPS: 25.0000 mg | ORAL_CAPSULE | Freq: Four times a day (QID) | ORAL | Status: DC | PRN

## 2019-11-23 MED ORDER — LACTATED RINGERS IV SOLN: INTRAVENOUS | Status: DC | PRN

## 2019-11-23 MED ORDER — BUPIVACAINE HCL (PF) 0.25 % IJ SOLN
INTRAMUSCULAR | Status: AC
  Filled 2019-11-23: qty 30

## 2019-11-23 MED ORDER — SODIUM BICARBONATE 8.4 % IV SOLN
INTRAVENOUS | Status: AC
  Filled 2019-11-23: qty 50

## 2019-11-23 MED ORDER — METOCLOPRAMIDE HCL 5 MG/ML IJ SOLN: 10.0000 mg | Freq: Once | INTRAMUSCULAR | Status: DC | PRN

## 2019-11-23 MED ORDER — NALBUPHINE HCL 10 MG/ML IJ SOLN: 5.0000 mg | Freq: Once | INTRAMUSCULAR | Status: DC | PRN

## 2019-11-23 MED ORDER — KETOROLAC TROMETHAMINE 30 MG/ML IJ SOLN
30.0000 mg | Freq: Four times a day (QID) | INTRAMUSCULAR | Status: AC
  Administered 2019-11-24 (×2): 30 mg via INTRAVENOUS
  Filled 2019-11-23 (×2): qty 1

## 2019-11-23 MED ORDER — CLINDAMYCIN PHOSPHATE 900 MG/50ML IV SOLN
900.0000 mg | INTRAVENOUS | Status: AC
  Administered 2019-11-23: 19:00:00 900 mg via INTRAVENOUS
  Filled 2019-11-23: qty 50

## 2019-11-23 MED ORDER — MORPHINE SULFATE (PF) 0.5 MG/ML IJ SOLN
INTRAMUSCULAR | Status: DC | PRN
  Administered 2019-11-23: 3 mg via EPIDURAL

## 2019-11-23 MED ORDER — SOD CITRATE-CITRIC ACID 500-334 MG/5ML PO SOLN
30.0000 mL | ORAL | Status: AC
  Administered 2019-11-23: 30 mL via ORAL

## 2019-11-23 MED ORDER — ONDANSETRON HCL 4 MG/2ML IJ SOLN
INTRAMUSCULAR | Status: AC
  Filled 2019-11-23: qty 2

## 2019-11-23 MED ORDER — SODIUM CHLORIDE 0.9 % IV SOLN
INTRAVENOUS | Status: AC
  Filled 2019-11-23: qty 500

## 2019-11-23 MED ORDER — BUPIVACAINE HCL 0.25 % IJ SOLN
INTRAMUSCULAR | Status: DC | PRN
  Administered 2019-11-23: 30 mL

## 2019-11-23 MED ORDER — SODIUM BICARBONATE 8.4 % IV SOLN
INTRAVENOUS | Status: DC | PRN
  Administered 2019-11-23: 3 mL via EPIDURAL
  Administered 2019-11-23 (×3): 5 mL via EPIDURAL
  Administered 2019-11-23: 2 mL via EPIDURAL
  Administered 2019-11-23: 5 mL via EPIDURAL

## 2019-11-23 MED ORDER — KETOROLAC TROMETHAMINE 30 MG/ML IJ SOLN: 30.0000 mg | Freq: Four times a day (QID) | INTRAMUSCULAR | Status: AC | PRN

## 2019-11-23 MED ORDER — SIMETHICONE 80 MG PO CHEW
80.0000 mg | CHEWABLE_TABLET | Freq: Three times a day (TID) | ORAL | Status: DC
  Administered 2019-11-24 – 2019-11-26 (×7): 80 mg via ORAL
  Filled 2019-11-23 (×8): qty 1

## 2019-11-23 MED ORDER — DIBUCAINE (PERIANAL) 1 % EX OINT: 1.0000 "application " | TOPICAL_OINTMENT | CUTANEOUS | Status: DC | PRN

## 2019-11-23 SURGICAL SUPPLY — 39 items
BENZOIN TINCTURE PRP APPL 2/3 (GAUZE/BANDAGES/DRESSINGS) ×2 IMPLANT
CANISTER SUCT 3000ML PPV (MISCELLANEOUS) ×2 IMPLANT
CHLORAPREP W/TINT 26ML (MISCELLANEOUS) ×2 IMPLANT
DECANTER SPIKE VIAL GLASS SM (MISCELLANEOUS) ×2 IMPLANT
DRESSING PREVENA PLUS CUSTOM (GAUZE/BANDAGES/DRESSINGS) ×1 IMPLANT
DRSG OPSITE POSTOP 4X10 (GAUZE/BANDAGES/DRESSINGS) ×2 IMPLANT
DRSG PREVENA PLUS CUSTOM (GAUZE/BANDAGES/DRESSINGS) ×2
ELECT REM PT RETURN 9FT ADLT (ELECTROSURGICAL) ×2
ELECTRODE REM PT RTRN 9FT ADLT (ELECTROSURGICAL) ×1 IMPLANT
EXTRACTOR VACUUM KIWI (MISCELLANEOUS) ×2 IMPLANT
GLOVE BIOGEL PI IND STRL 7.0 (GLOVE) ×2 IMPLANT
GLOVE BIOGEL PI IND STRL 7.5 (GLOVE) ×1 IMPLANT
GLOVE BIOGEL PI INDICATOR 7.0 (GLOVE) ×2
GLOVE BIOGEL PI INDICATOR 7.5 (GLOVE) ×1
GLOVE SKINSENSE NS SZ7.0 (GLOVE) ×1
GLOVE SKINSENSE STRL SZ7.0 (GLOVE) ×1 IMPLANT
GOWN STRL REUS W/ TWL LRG LVL3 (GOWN DISPOSABLE) ×2 IMPLANT
GOWN STRL REUS W/ TWL XL LVL3 (GOWN DISPOSABLE) ×1 IMPLANT
GOWN STRL REUS W/TWL LRG LVL3 (GOWN DISPOSABLE) ×2
GOWN STRL REUS W/TWL XL LVL3 (GOWN DISPOSABLE) ×1
HOVERMATT SINGLE USE (MISCELLANEOUS) ×2 IMPLANT
NS IRRIG 1000ML POUR BTL (IV SOLUTION) ×2 IMPLANT
PACK C SECTION WH (CUSTOM PROCEDURE TRAY) ×2 IMPLANT
PAD ABD 7.5X8 STRL (GAUZE/BANDAGES/DRESSINGS) ×2 IMPLANT
PAD OB MATERNITY 4.3X12.25 (PERSONAL CARE ITEMS) ×2 IMPLANT
PAD PREP 24X48 CUFFED NSTRL (MISCELLANEOUS) ×2 IMPLANT
PENCIL SMOKE EVAC W/HOLSTER (ELECTROSURGICAL) ×2 IMPLANT
RETRACTOR TRAXI PANNICULUS (MISCELLANEOUS) ×1 IMPLANT
STRIP CLOSURE SKIN 1/2X4 (GAUZE/BANDAGES/DRESSINGS) ×2 IMPLANT
SUT MNCRL 0 VIOLET CTX 36 (SUTURE) ×2 IMPLANT
SUT MON AB 4-0 PS1 27 (SUTURE) ×2 IMPLANT
SUT MONOCRYL 0 CTX 36 (SUTURE) ×2
SUT PLAIN 2 0 XLH (SUTURE) ×4 IMPLANT
SUT VIC AB 0 CT1 36 (SUTURE) ×4 IMPLANT
SUT VIC AB 3-0 CT1 27 (SUTURE) ×1
SUT VIC AB 3-0 CT1 TAPERPNT 27 (SUTURE) ×1 IMPLANT
TOWEL OR 17X24 6PK STRL BLUE (TOWEL DISPOSABLE) ×4 IMPLANT
TRAXI PANNICULUS RETRACTOR (MISCELLANEOUS) ×1
WATER STERILE IRR 1000ML POUR (IV SOLUTION) ×2 IMPLANT

## 2019-11-23 NOTE — Op Note (Signed)
Preoperative Diagnosis:  IUP @ [redacted]w[redacted]d, arrest of dilation at 6 cm, chorioamnionitis, pre-eclampsia with severe features  Postoperative Diagnosis:  Same  Procedure: Primary low transverse cesarean section  Surgeon: Tinnie Gens, M.D.  Assistant: Venora Maples, MD/MPH  Anesthesia: epidural with Mal Amabile, MD  Findings: Viable female infant, APGAR (1 MIN): 5   APGAR (5 MINS): 8   Weight 3025 grams, Normal tubes and ovaries  Estimated blood loss: 358 cc  Complications: None known  Specimens: Placenta to pathology  Reason for procedure: Briefly, the patient is a 34 y.o. G1P1001 at [redacted]w[redacted]d who was recommended for cesarean due to arrest of dilation. Patient admitted four days previously for IOL for gestational hypertension and GDMA2. During course of induction developed severe range pressures and diagnosed with pre-eclampsia with severe features and started on Magnesium. At decision time she had been 6 cm for four hours with adequate contractions without cervical change and was diagnosed with arrest of dilation.   The risks of cesarean section discussed with the patient included but were not limited to: bleeding which may require transfusion or reoperation; infection which may require antibiotics; injury to bowel, bladder, ureters or other surrounding organs; injury to the fetus; need for additional procedures including hysterectomy in the event of a life-threatening hemorrhage; placental abnormalities with subsequent pregnancies, incisional problems, thromboembolic phenomenon and other postoperative/anesthesia complications. The patient concurred with the proposed plan, giving informed written consent for the procedure.   Procedure: Patient is to OR where epidural analgesia was dosed up to surgical level. She was then placed in a supine position with left lateral tilt. She was already receiving ampicillin and gentamicin and in addition was given clindamycin. A Foley catheter was placed in the  bladder. She was prepped and draped in the usual sterile fashion. A timeout was performed. A knife was then used to make a Pfannenstiel incision. This incision was carried out to underlying fascia which was divided in the midline with the knife. The incision was extended laterally, sharply. The fascia was dissected of the underlying rectus superiorly.  The rectus was divided in the midline.  The peritoneal cavity was entered bluntly.  Alexis retractor was placed inside the incision.  A knife was used to make a low transverse incision on the uterus. This incision was carried down to the amniotic cavity was entered. Fetus was in OP position and was brought up out of the incision without difficulty. Cord was clamped x 2 and cut. Infant taken to waiting pediatrician.  Cord blood was obtained. Placenta was delivered from the uterus.  Uterus was cleaned with dry lap pads. Uterine incision closed with 0 Monocryl suture in a locked running fashion, followed by an imbricating layer of 0 Monocryl.  Alexis retractor was removed from the abdomen. Peritoneal closure was done with remaining portion of 0 Monocryl suture.  Fascia was closed with 0 Vicryl suture in a running fashion. Subcutaneous closure was performed with 0 Plain suture.  Skin closed using 3-0 Vicryl on a Keith needle. Subcutaneous tissue infiltrated with Marcaine. Steri strips applied, followed by pressure dressing and Prevena wound vac.  All instrument, needle and lap counts were correct x 2.  Patient was awake and taken to PACU stable.   Zack Seal, MD/MPH [facility] [employer] [facility])

## 2019-11-23 NOTE — Anesthesia Procedure Notes (Addendum)
Epidural Patient location during procedure: OB  Staffing Anesthesiologist: Lewie Loron, MD Performed: anesthesiologist   Preanesthetic Checklist Completed: patient identified, IV checked, risks and benefits discussed, monitors and equipment checked, pre-op evaluation and timeout performed  Epidural Patient position: sitting Prep: DuraPrep and site prepped and draped Patient monitoring: heart rate, continuous pulse ox and blood pressure Approach: midline Location: L4-L5 Injection technique: LOR air and LOR saline  Needle:  Needle type: Tuohy  Needle gauge: 17 G Needle length: 9 cm Needle insertion depth: 9 cm Catheter type: closed end flexible Catheter size: 19 Gauge Catheter at skin depth: 14 cm Test dose: negative  Assessment Sensory level: T8 Events: blood not aspirated, injection not painful, no injection resistance, no paresthesia and negative IV test  Additional Notes Reason for block:procedure for pain

## 2019-11-23 NOTE — Transfer of Care (Signed)
Immediate Anesthesia Transfer of Care Note  Patient: Denise Barker  Procedure(s) Performed: CESAREAN SECTION (N/A )  Patient Location: PACU  Anesthesia Type:Epidural  Level of Consciousness: awake, alert  and oriented  Airway & Oxygen Therapy: Patient Spontanous Breathing  Post-op Assessment: Report given to RN and Post -op Vital signs reviewed and stable  Post vital signs: Reviewed and stable  Last Vitals:  Vitals Value Taken Time  BP 133/66 11/23/19 2001  Temp    Pulse 111 11/23/19 2005  Resp 22 11/23/19 2005  SpO2 96 % 11/23/19 2005  Vitals shown include unvalidated device data.  Last Pain:  Vitals:   11/23/19 1739  TempSrc:   PainSc: 0-No pain         Complications: No apparent anesthesia complications

## 2019-11-23 NOTE — Progress Notes (Signed)
Denise Barker is a 34 y.o. G1P0 at [redacted]w[redacted]d admitted for IOL 2/2 gHTN and A2GDM  Subjective: More uncomfortable, CRNA at bedside to re-dose epidural.  Objective: BP (!) 144/91   Pulse (!) 105   Temp 98.8 F (37.1 C) (Axillary)   Resp 16   Ht 5\' 10"  (1.778 m)   Wt (!) 140.6 kg   LMP 03/03/2019   SpO2 96%   BMI 44.48 kg/m  Total I/O In: -  Out: 6175 [Urine:6175]  FHT:  FHR: 135 bpm, variability: moderate,  accelerations:  Present,  decelerations:  Present occasional variable UC:   irregular, every 2-3 minutes, MVUs 120-150  SVE:   Dilation: 5 Effacement (%): 60 Station: 0 Exam by:: MD Mahoney  Pitocin @ 18 mu/min  Labs: Lab Results  Component Value Date   WBC 10.2 11/22/2019   HGB 11.7 (L) 11/22/2019   HCT 35.1 (L) 11/22/2019   MCV 90.7 11/22/2019   PLT 301 11/22/2019    Assessment / Plan: 01/20/2020 Denise Barker a 34 y.o.G1P0 at 17w6dherefor IOL for gHTN and A2GDM.  #Labor:S/p cyto x9.S/p FB, AROM @0910  on 2/3, IUPC in place.Pitocin was on from 2/2 @ 2206 until 2/3 @2012 . Pit restarted @0059 , MVUs 120-150, will continue to titrate. Will defer cervical checks until MVUs are adequate or patient starts to feel pressure. No signs/symptoms of chorio. #Preeclampsianow withsevere featuresby BP. On Mag. Most recent BP 137/100. #Fetal Wellbeing: Category I #Pain Control:comfortable with epidural #ID: GBS Negative #Anticipated , CS as appropriate #A2GDM-most recent CBG91. Continue CBGs q4, q2h when active labor.  2207 DO OB Fellow, Faculty Practice 11/23/2019, 5:06 AM

## 2019-11-23 NOTE — Discharge Summary (Addendum)
Postpartum Discharge Summary     Patient Name: Denise Barker DOB: Dec 18, 1985 MRN: [id number]  Date of admission: 11/20/2019 Delivering Provider: Donnamae Jude   Date of discharge: 11/26/2019  Admitting diagnosis: Gestational hypertension [O13.9] Intrauterine pregnancy: [redacted]w[redacted]d    Secondary diagnosis:  Active Problems:   Supervision of low-risk pregnancy   Obesity in pregnancy   Gestational diabetes   Severe pre-eclampsia, with delivery   COVID-19   Cesarean delivery delivered   Chorioamnionitis, delivered, current hospitalization  Additional problems: None     Discharge diagnosis: Term Pregnancy Delivered, Preeclampsia (severe) and GDM A2                                                                                                Post partum procedures:Nexplanon insertion  Augmentation: AROM, Pitocin, Cytotec and Foley Balloon  Complications: Intrauterine Inflammation or infection (Chorioamniotis)  Hospital course:  Induction of Labor With Cesarean Section  34y.o. yo G1P1001 at 337w6das admitted to the hospital 11/20/2019 for induction of labor for gHTN and GMDA2. Patient had a labor course significant for: induced with misoprostol x8, foley balloon, pitocin followed by pit break and resumption of pitocin and AROM. After four days of induction patient reached 6 cm and was having adequate contractions for four hours without cervical change and was diagnosed with arrest of dilation. During her labor she developed severe range BP's and was diagnosed with Pre-eclampsia with severe features, and she also developed chorioamnionitis. The patient went for cesarean section due to Arrest of Dilation, and delivered a Viable infant,11/23/2019  Membrane Rupture Time/Date: 9:19 AM ,11/22/2019   Details of operation can be found in separate operative Note.  Patient had an uncomplicated postpartum course. Glucose levels intermittently elevated; GTT recommended outpatient along with PCP f/u. Procardia XL 30  mg given post-partum and increased to 60 mg on day of discharge due to elevated BP's. Patient asymptomatic and will continue to follow at home. Nexplanon placed. She is ambulating, tolerating a regular diet, passing flatus, and urinating well. Prevena in place; appt requested on 2/11 for in-office eval. Patient is discharged home in stable condition on 11/26/19.                                   Delivery time: 7:08 PM    Magnesium Sulfate received: Yes BMZ received: No Rhophylac:N/A MMR:N/A Transfusion:No  Physical exam  Vitals:   11/26/19 0644 11/26/19 0802 11/26/19 1230 11/26/19 1235  BP: (!) 143/89 (!) 150/85  (!) 151/99  Pulse: (!) 106 96  100  Resp: 18 18  18   Temp: 98.8 F (37.1 C) 98.6 F (37 C)  98.8 F (37.1 C)  TempSrc: Oral Oral  Oral  SpO2: 99% 98% 99% 98%  Weight:      Height:       General: alert, cooperative and no distress Lochia: appropriate Uterine Fundus: firm Incision: Prevena in place DVT Evaluation: No evidence of DVT seen on physical exam. Labs: Lab Results  Component Value Date   WBC  19.9 (H) 11/24/2019   HGB 11.0 (L) 11/24/2019   HCT 32.7 (L) 11/24/2019   MCV 89.6 11/24/2019   PLT 368 11/24/2019   CMP Latest Ref Rng & Units 11/23/2019  Glucose 70 - 99 mg/dL 113(H)  BUN 6 - 20 mg/dL <5(L)  Creatinine 0.44 - 1.00 mg/dL 0.74  Sodium 135 - 145 mmol/L 135  Potassium 3.5 - 5.1 mmol/L 3.6  Chloride 98 - 111 mmol/L 103  CO2 22 - 32 mmol/L 22  Calcium 8.9 - 10.3 mg/dL 8.2(L)  Total Protein 6.5 - 8.1 g/dL 6.1(L)  Total Bilirubin 0.3 - 1.2 mg/dL 1.2  Alkaline Phos 38 - 126 U/L 143(H)  AST 15 - 41 U/L 16  ALT 0 - 44 U/L 17    Discharge instruction: per After Visit Summary and "Baby and Me Booklet".  After visit meds:  Allergies as of 11/26/2019   No Known Allergies     Medication List    STOP taking these medications   Accu-Chek Guide test strip Generic drug: glucose blood   Accu-Chek Softclix Lancets lancets   aspirin EC 81 MG tablet    cyclobenzaprine 10 MG tablet Commonly known as: FLEXERIL   metFORMIN 500 MG tablet Commonly known as: GLUCOPHAGE   TYLENOL 500 MG tablet Generic drug: acetaminophen     TAKE these medications   albuterol 108 (90 Base) MCG/ACT inhaler Commonly known as: VENTOLIN HFA Inhale 1 puff into the lungs every 6 (six) hours as needed for wheezing or shortness of breath.   Blood Pressure Kit Devi 1 Device by Does not apply route as needed. ICD 10 :  Z34.90   Breast Pump Misc 1 Device by Does not apply route as needed.   Comfort Fit Maternity Supp Lg Misc 1 each by Does not apply route daily as needed.   HYDROcodone-acetaminophen 5-325 MG tablet Commonly known as: NORCO/VICODIN Take 1-2 tablets by mouth every 4 (four) hours as needed for moderate pain.   ibuprofen 800 MG tablet Commonly known as: ADVIL Take 1 tablet (800 mg total) by mouth every 8 (eight) hours.   NIFEdipine 60 MG 24 hr tablet Commonly known as: ADALAT CC Take 1 tablet (60 mg total) by mouth daily. Start taking on: November 27, 2019   Prenatal 27-1 MG Tabs Take 1 tablet by mouth daily.   senna-docusate 8.6-50 MG tablet Commonly known as: Senokot-S Take 2 tablets by mouth daily. Start taking on: November 27, 2019   sertraline 50 MG tablet Commonly known as: Zoloft Take 1 tablet (50 mg total) by mouth daily.       Diet: carb modified diet  Activity: Advance as tolerated. Pelvic rest for 6 weeks.   Outpatient follow up:6 weeks Follow up Appt: Future Appointments  Date Time Provider Spearman  12/22/2019  8:50 AM [facility] LAB [facility]OC-[facility]A WOC  12/22/2019  9:15 AM Kooistra, Mervyn Skeeters, CNM WOC-[facility]A WOC   Follow up Visit:  Please schedule this patient for Postpartum visit in: 6 weeks with the following provider: Any provider In-Person For C/S patients schedule nurse incision check in weeks 2 weeks: yes High risk pregnancy complicated by: gHTN>PreE w SF, GDMA2 Delivery mode:  CS Anticipated  Birth Control:  Nexplanon PP Procedures needed: BP check, incision check, 2hr GTT  Schedule Integrated BH visit: no   Newborn Data: Live born female  Birth Weight:  3025g APGAR: 5, 8  Newborn Delivery   Birth date/time: 11/23/2019 19:08:00 Delivery type: C-Section, Low Transverse Trial of labor: Yes C-section categorization:  Primary      Baby Feeding: Breast Disposition:home with mother vs rooming-in   11/26/2019 Chauncey Mann, MD

## 2019-11-23 NOTE — Progress Notes (Signed)
Called by RN for fever of 101.1. Suspect chorio, amp and gent ordered  Marlowe Alt, DO OB Fellow, Faculty Practice 11/23/2019 7:26 AM

## 2019-11-23 NOTE — Anesthesia Procedure Notes (Signed)
Epidural Patient location during procedure: OB  Staffing Anesthesiologist: Lewie Loron, MD Performed: anesthesiologist   Preanesthetic Checklist Completed: patient identified, IV checked, risks and benefits discussed, monitors and equipment checked, pre-op evaluation and timeout performed  Epidural Patient position: sitting Prep: DuraPrep and site prepped and draped Patient monitoring: heart rate, continuous pulse ox and blood pressure Approach: midline Location: L3-L4 Injection technique: LOR air and LOR saline  Needle:  Needle type: Tuohy  Needle gauge: 17 G Needle length: 9 cm Needle insertion depth: 9 cm Catheter type: closed end flexible Catheter size: 19 Gauge Catheter at skin depth: 14 cm Test dose: negative  Assessment Sensory level: T8 Events: blood not aspirated, injection not painful, no injection resistance, no paresthesia and negative IV test  Additional Notes Reason for block:procedure for pain

## 2019-11-23 NOTE — Progress Notes (Signed)
Denise Barker is a 34 y.o. G1P0 at [redacted]w[redacted]d admitted for IOL 2/2 gHTN and A2GDM  Subjective: Screaming with contractions.  Objective: BP (!) 156/79   Pulse (!) 111   Temp (!) 101.7 F (38.7 C) (Axillary)   Resp 16   Ht 5\' 10"  (1.778 m)   Wt (!) 140.6 kg   LMP 03/03/2019   SpO2 96%   BMI 44.48 kg/m  Total I/O In: 220 [P.O.:220] Out: 900 [Urine:900]  FHT:  FHR: 150 bpm, variability: moderate,  accelerations:  Present,  decelerations:  Present variables UC:   regular, every 2-3 minutes, MVUs 160-180  SVE:   Dilation: 6 Effacement (%): 80, 90 Station: -1 Exam by:: Dr. [email or web address]  Pitocin @ 20 mu/min  Labs: Lab Results  Component Value Date   WBC 10.2 11/22/2019   HGB 11.7 (L) 11/22/2019   HCT 35.1 (L) 11/22/2019   MCV 90.7 11/22/2019   PLT 301 11/22/2019    Assessment / Plan: 01/20/2020 Denise Barker a 34 y.o.G1P0 at 59w6dherefor IOL for gHTN and A2GDM.  #Labor:S/p cyto x9.S/p FB, AROM @0910  on 2/3, IUPC in place.Pitocin was on from2/2@2206until2 /3@2012 .Pit restarted @0059 , MVUs 160-180, will continue to titrate.  pressure.  #Chorio: Tmax of 101.1, amp and gent started. #Preeclampsianow withsevere featuresby BP. On Mag. Most recent BP 156/79. Did have one severe pressure which required IV labetalol. #Fetal Wellbeing: Category I #Pain Control:epidural needs to be replaced #ID: GBS Negative #A2GDM-most recent CBG110. Continue CBGs q4, q2h when active labor.  Report given to Dr. [redacted]w[redacted]d, who assumes the patient's care.  Guarded for vaginal delivery, CS as appropriate.  DO OB Fellow, Faculty Practice 11/23/2019, 9:21 AM

## 2019-11-23 NOTE — Progress Notes (Addendum)
Examined patient, cervix unchanged over 4 hours despite adequate MVU's. Reviewed lack of progress with patient and diagnosis of arrest of dilation, she is amenable to C-section.  The risks of cesarean section discussed with the patient included but were not limited to: bleeding which may require transfusion or reoperation; infection which may require antibiotics; injury to bowel, bladder, ureters or other surrounding organs; injury to the fetus; need for additional procedures including hysterectomy in the event of a life-threatening hemorrhage; placental abnormalities with subsequent pregnancies, incisional problems, thromboembolic phenomenon and other postoperative/anesthesia complications. The patient concurred with the proposed plan, giving informed written consent for the procedure. Anesthesia and OR aware. Preoperative prophylactic antibiotics and SCDs ordered on call to the OR. To OR when ready.

## 2019-11-23 NOTE — Anesthesia Postprocedure Evaluation (Signed)
Anesthesia Post Note  Patient: Chief Financial Officer  Procedure(s) Performed: CESAREAN SECTION (N/A )     Patient location during evaluation: PACU Anesthesia Type: Epidural Level of consciousness: oriented and awake and alert Pain management: pain level controlled Vital Signs Assessment: post-procedure vital signs reviewed and stable Respiratory status: spontaneous breathing, respiratory function stable and nonlabored ventilation Cardiovascular status: blood pressure returned to baseline and stable Postop Assessment: no headache, no backache, no apparent nausea or vomiting, epidural receding and patient able to bend at knees Anesthetic complications: no    Last Vitals:  Vitals:   11/23/19 2030 11/23/19 2045  BP: (!) 143/100 127/80  Pulse: (!) 105 (!) 110  Resp: (!) 27 (!) 21  Temp:    SpO2: 97% 96%    Last Pain:  Vitals:   11/23/19 2045  TempSrc:   PainSc: 2    Pain Goal:                Epidural/Spinal Function Cutaneous sensation: Able to Wiggle Toes (11/23/19 2045), Patient able to flex knees: Yes (11/23/19 2045), Patient able to lift hips off bed: No (11/23/19 2045), Back pain beyond tenderness at insertion site: No (11/23/19 2045), Progressively worsening motor and/or sensory loss: No (11/23/19 2045), Bowel and/or bladder incontinence post epidural: No (11/23/19 2045)  Yoandri Congrove A.

## 2019-11-23 NOTE — Progress Notes (Signed)
Denise Barker is a 34 y.o. G1P0 at [redacted]w[redacted]d admitted for IOL 2/2 gHTN and A2GDM  Subjective: Comfortable with epidural  Objective: BP (!) 151/84   Pulse (!) 115   Temp 99.7 F (37.6 C) (Axillary)   Resp 18   Ht 5\' 10"  (1.778 m)   Wt (!) 140.6 kg   LMP 03/03/2019   SpO2 96%   BMI 44.48 kg/m  Total I/O In: -  Out: 5325 [Urine:5325]  FHT:  FHR: 145 bpm, variability: moderate,  accelerations:  Present,  decelerations:  Absent UC:   none  SVE:   Dilation: 5 Effacement (%): 60 Station: 0 Exam by:: MD Murcko  Labs: Lab Results  Component Value Date   WBC 10.2 11/22/2019   HGB 11.7 (L) 11/22/2019   HCT 35.1 (L) 11/22/2019   MCV 90.7 11/22/2019   PLT 301 11/22/2019    Assessment / Plan: 01/20/2020 [name] a 34 y.o.G1P0 at [redacted]w[redacted]d here for IOL for gHTN and A2GDM.  #Labor:S/p cyto x9.S/p FB, AROM @0910  on 2/3, IUPC in place.Pitocin was on from 2/2 @ 2206 until 2/3 @2012 . Pit restarted @0059 , will titrate pitocin according to MVUs. #Preeclampsia now with severe features by BP. On Mag. Most recent BP 151/84. #Fetal Wellbeing: Category I #Pain Control:comfortable with epidural #ID: GBS Negative #Anticipated , CS as appropriate #A2GDM-most recent CBG 102. Continue CBGs q4, q2h when active labor.  [id number] DO OB Fellow, Faculty [facility] 11/23/2019, 1:00 AM

## 2019-11-23 NOTE — Progress Notes (Signed)
ANTIBIOTIC CONSULT NOTE - INITIAL  Pharmacy Consult for Gentamicin Indication: Chorioamnionitis  No Known Allergies  Patient Measurements: Height: 5\' 10"  (177.8 cm) Weight: (!) 310 lb (140.6 kg) IBW/kg (Calculated) : 68.5 Adjusted Body Weight: 97.3 kg  Vital Signs: Temp: 101.1 F (38.4 C) (02/04 0648) Temp Source: Axillary (02/04 0648) BP: 159/100 (02/04 0631) Pulse Rate: 111 (02/04 0631)  Labs: Recent Labs    11/20/19 0807 11/20/19 1212 11/21/19 2016 11/22/19 1446  WBC 7.3  --  8.3 10.2  HGB 11.2*  --  11.7* 11.7*  PLT 347  --  300 301  LABCREA  --  127.70  --   --   CREATININE 0.59  --   --  0.73   No results for input(s): GENTTROUGH, GENTPEAK, GENTRANDOM in the last 72 hours.   Microbiology: Recent Results (from the past 720 hour(s))  Novel Coronavirus, NAA ([facility])     Status: Abnormal   Collection Time: 11/08/19 10:46 AM   Specimen: Nasopharyngeal(NP) swabs in vial transport medium   NASOPHARYNGE  TESTING  Result Value Ref Range Status   SARS-CoV-2, NAA Detected (A) Not Detected Final    Comment: This nucleic acid amplification test was developed and its performance characteristics determined by 11/10/19. Nucleic acid amplification tests include RT-PCR and TMA. This test has not been FDA cleared or approved. This test has been authorized by FDA under an Emergency Use Authorization (EUA). This test is only authorized for the duration of time the declaration that circumstances exist justifying the authorization of the emergency use of in vitro diagnostic tests for detection of SARS-CoV-2 virus and/or diagnosis of COVID-19 infection under section 564(b)(1) of the Act, 21 U.S.C. [employer]) (1), unless the authorization is terminated or revoked sooner. When diagnostic testing is negative, the possibility of a false negative result should be considered in the context of a patient's recent exposures and the presence of clinical signs and  symptoms consistent with COVID-19. An individual without symptoms of COVID-19 and who is not shedding SARS-CoV-2 virus wo uld expect to have a negative (not detected) result in this assay.   Group B strep by PCR     Status: None   Collection Time: 11/20/19 11:21 AM   Specimen: Vaginal/Rectal; Genital  Result Value Ref Range Status   Group B strep by PCR NEGATIVE NEGATIVE Final    Comment: (NOTE) Intrapartum testing with Xpert GBS assay should be used as an adjunct to other methods available and not used to replace antepartum testing (at 35-[redacted] weeks gestation). Performed at [facility], [address] [place], [place]   OB RESULT CONSOLE Group B Strep     Status: None   Collection Time: 11/20/19  4:23 PM  Result Value Ref Range Status   GBS Negative  Final     Assessment: 34 y.o. female G1P0 at [redacted]w[redacted]d. Pharmacy consulted to dose gentamicin  Goal of Therapy:  Gentamicin peak 6-8 mg/L and Trough < 1 mg/L  Plan:  Gentamicin 490 mg (5 mg/kg) IV every 24 hrs (based on AdjBW) Check Scr with next labs if gentamicin continued. Will check gentamicin levels if continued > 72hr or clinically indicated.   Thank you for allowing [redacted]w[redacted]d to participate in this patients care. [place], PharmD 11/23/2019,7:08 AM

## 2019-11-23 NOTE — Progress Notes (Addendum)
L&D Note  11/23/2019 - 3:56 PM  34 y.o. G1 [redacted]w[redacted]d. Pregnancy complicated by gHTN, BMI 40s, GDM, h/o +COVID on 1/21  Patient Active Problem List   Diagnosis Date Noted  . COVID-19 11/10/2019  . Gestational hypertension 10/02/2019  . Gestational diabetes 09/20/2019  . Nausea and vomiting in pregnancy 07/06/2019  . Obesity in pregnancy 06/01/2019  . Anxiety   . Depression affecting pregnancy   . Supervision of low-risk pregnancy   . Asthma 05/22/1995    [name] is admitted for severe pre-eclampsia superimposed on gHTN (BPs)   Subjective:  Patient very uncomfortable with epidural again.  Objective:   Vitals:   11/23/19 1356 11/23/19 1401 11/23/19 1406 11/23/19 1509  BP: (!) 153/82 (!) 153/83 (!) 158/92   Pulse: (!) 110 (!) 112 (!) 114   Resp:      Temp:    100.3 F (37.9 C)  TempSrc:    Axillary  SpO2:      Weight:      Height:        Current Vital Signs 24h Vital Sign Ranges  T 100.3 F (37.9 C) Temp  Avg: 99.6 F (37.6 C)  Min: 98.1 F (36.7 C)  Max: 101.7 F (38.7 C)  BP (!) 158/92 BP  Min: 107/47  Max: 170/93  HR (!) 114 Pulse  Avg: 105.3  Min: 87  Max: 123  RR 18 Resp  Avg: 17.7  Min: 16  Max: 20  SaO2 96 %   SpO2  Avg: 97.4 %  Min: 96 %  Max: 98 %       24 Hour I/O Current Shift I/O  Time Ins Outs 02/03 0701 - 02/04 0700 In: 5849.2 [P.O.:840; I.V.:5009.2] Out: 9850 [Urine:9850] 02/04 0701 - 02/04 1900 In: 4175.7 [P.O.:710; I.V.:2844.6] Out: 1975 [PFXTK:2409]   UOP: >159mL/hr  FHR: 145 baseline, occasional accels, no decel, mod variability Toco: q82m MVUs 180s Gen: moderate distress with UCs SVE: deferred. 6-7//90/0 per Dr. Dione Plover @ 1500  Labs:  Recent Labs  Lab 11/21/19 2016 11/22/19 1446 11/23/19 0910  WBC 8.3 10.2 14.1*  HGB 11.7* 11.7* 11.6*  HCT 34.5* 35.1* 34.3*  PLT 300 301 316   Recent Labs  Lab 11/20/19 0807 11/22/19 1446 11/23/19 0910  NA 138 138 135  K 3.6 3.5 3.6  CL 106 104 103  CO2 21* 23 22  BUN 6 <5* <5*   CREATININE 0.59 0.73 0.74  CALCIUM 9.4 9.2 8.2*  PROT 6.4* 5.6* 6.1*  BILITOT 0.5 0.9 1.2  ALKPHOS 115 124 143*  ALT 17 16 17   AST 20 18 16   GLUCOSE 100* 141* 113*    Medications Current Facility-Administered Medications  Medication Dose Route Frequency Provider Last Rate Last Admin  . acetaminophen (TYLENOL) tablet 1,000 mg  1,000 mg Oral Once Aletha Halim, MD      . acetaminophen (TYLENOL) tablet 650 mg  650 mg Oral Q4H PRN Clarnce Flock, MD   650 mg at 11/23/19 0008  . ampicillin (OMNIPEN) 2 g in sodium chloride 0.9 % 100 mL IVPB  2 g Intravenous Q6H Sparacino, Hailey L, DO   Stopped at 11/23/19 1216  . diphenhydrAMINE (BENADRYL) injection 12.5 mg  12.5 mg Intravenous Q15 min PRN Josephine Igo, MD      . ePHEDrine injection 10 mg  10 mg Intravenous PRN Josephine Igo, MD      . ePHEDrine injection 10 mg  10 mg Intravenous PRN Josephine Igo, MD      .  fentaNYL (SUBLIMAZE) injection 50-100 mcg  50-100 mcg Intravenous Q1H PRN Venora Maples, MD   100 mcg at 11/22/19 1400  . fentaNYL 2 mcg/mL w/ bupivacaine 0.125% in NS 250 mL epidural infusion ([facility])  12 mL/hr Epidural Continuous PRN Mal Amabile, MD 12 mL/hr at 11/23/19 0943 12 mL/hr at 11/23/19 0943  . gentamicin (GARAMYCIN) 490 mg in dextrose 5 % 100 mL IVPB  5 mg/kg (Adjusted) Intravenous Q24H Reva Bores, MD 112.3 mL/hr at 11/23/19 0917 490 mg at 11/23/19 0917  . labetalol (NORMODYNE) injection 20 mg  20 mg Intravenous PRN Venora Maples, MD   20 mg at 11/23/19 0831   And  . labetalol (NORMODYNE) injection 40 mg  40 mg Intravenous PRN Venora Maples, MD       And  . labetalol (NORMODYNE) injection 80 mg  80 mg Intravenous PRN Venora Maples, MD       And  . hydrALAZINE (APRESOLINE) injection 10 mg  10 mg Intravenous PRN Venora Maples, MD      . lactated ringers infusion 500 mL  500 mL Intravenous Once Mal Amabile, MD      . lactated ringers infusion 500 mL  500 mL Intravenous Once  Mal Amabile, MD      . lactated ringers infusion 500-1,000 mL  500-1,000 mL Intravenous PRN Venora Maples, MD      . lactated ringers infusion   Intravenous Continuous Sparacino, Hailey L, DO 75 mL/hr at 11/22/19 2138 Rate Change at 11/22/19 2138  . lactated ringers infusion   Intravenous Continuous Venora Maples, MD   Stopped at 11/23/19 1156  . lidocaine (PF) (XYLOCAINE) 1 % injection 30 mL  30 mL Subcutaneous PRN Venora Maples, MD      . magnesium sulfate 40 grams in SWI 1000 mL OB infusion  2 g/hr Intravenous Continuous Venora Maples, MD 50 mL/hr at 11/23/19 1200 2 g/hr at 11/23/19 1200  . misoprostol (CYTOTEC) tablet 25 mcg  25 mcg Vaginal Q4H PRN Venora Maples, MD   25 mcg at 11/22/19 2047  . ondansetron (ZOFRAN) injection 4 mg  4 mg Intravenous Q6H PRN Venora Maples, MD   4 mg at 11/21/19 1420  . oxytocin (PITOCIN) 40 Units in sodium chloride 0.9 % 1,000 mL (0.04 Units/mL) infusion  1-40 milli-units/min Intravenous Titrated Reva Bores, MD 36 mL/hr at 11/23/19 1234 24 milli-units/min at 11/23/19 1234  . oxytocin (PITOCIN) IV BOLUS FROM BAG  500 mL Intravenous Once Venora Maples, MD      . oxytocin (PITOCIN) IV infusion 40 units in NS 1000 mL - Premix  2.5 Units/hr Intravenous Continuous Venora Maples, MD      . PHENYLephrine 40 mcg/ml in normal saline Adult IV Push Syringe  80 mcg Intravenous PRN Mal Amabile, MD      . PHENYLephrine 40 mcg/ml in normal saline Adult IV Push Syringe  80 mcg Intravenous PRN Mal Amabile, MD      . sertraline (ZOLOFT) tablet 50 mg  50 mg Oral Daily Venora Maples, MD   50 mg at 11/23/19 1147  . sodium citrate-citric acid (ORACIT) solution 30 mL  30 mL Oral Q2H PRN Venora Maples, MD      . terbutaline (BRETHINE) injection 0.25 mg  0.25 mg Subcutaneous Once PRN Venora Maples, MD      . terbutaline (BRETHINE) injection 0.25 mg  0.25 mg Subcutaneous Once PRN Sparacino, Hailey L, DO  Facility-Administered Medications Ordered in Other Encounters  Medication Dose Route Frequency Provider Last Rate Last Admin  . fentaNYL (SUBLIMAZE) 500 mcg, bupivacaine (SENSORCAINE-MPF) 41.67 mL in sodium chloride (PF) 0.9 % 250 mL epidural   Epidural Continuous PRN Mal Amabile, MD 13 mL/hr at 11/21/19 2229 13 mL/hr at 11/21/19 2229  . lidocaine (PF) (XYLOCAINE) 1 % injection   Epidural Anesthesia Intra-op Mal Amabile, MD   4 mL at 11/23/19 1109  . lidocaine-EPINEPHrine (XYLOCAINE W/EPI) 2 %-1:200000 (PF) injection   Epidural Anesthesia Intra-op Beryle Lathe, MD   4 mL at 11/22/19 1925   Results for AALLIYAH, KILKER (MRN [id number]) as of 11/23/2019 16:12  Ref. Range 11/22/2019 23:58 11/23/2019 03:59 11/23/2019 08:49 11/23/2019 09:10 11/23/2019 13:49  Glucose-Capillary Latest Ref Range: 70 - 99 mg/dL 258 (H) 91 527 (H)  782 (H)    Assessment & Plan:  Pt stable *IUP: category I with accels *Labor: d/w pt that she is now adequate with MVUs and cx is making cervical change albeit slow. I told her I recommend continuing with pitocin, have anesthesia try and titrate her epidural again and recheck at about two hours since her last exam and if not continuing to make change, I d/w her re: c-section.  *Chorio: continue with amp/gent. Will give another dose of apap for low grade temp. Fetus doing well *Severe pre-eclampsia: continue with Mg. IV PRNs as needed *GDM: continue with CBG checks *GBS: neg *Analgesia: epidural. D/w anesthesia and will try and readjust and make pt comfortable again.   Cornelia Copa MD Attending Center for [facility])

## 2019-11-24 LAB — TYPE AND SCREEN
ABO/RH(D): A POS
Antibody Screen: NEGATIVE

## 2019-11-24 LAB — CBC
HCT: 32.7 % — ABNORMAL LOW (ref 36.0–46.0)
Hemoglobin: 11 g/dL — ABNORMAL LOW (ref 12.0–15.0)
MCH: 30.1 pg (ref 26.0–34.0)
MCHC: 33.6 g/dL (ref 30.0–36.0)
MCV: 89.6 fL (ref 80.0–100.0)
Platelets: 368 10*3/uL (ref 150–400)
RBC: 3.65 MIL/uL — ABNORMAL LOW (ref 3.87–5.11)
RDW: 14.3 % (ref 11.5–15.5)
WBC: 19.9 10*3/uL — ABNORMAL HIGH (ref 4.0–10.5)
nRBC: 0 % (ref 0.0–0.2)

## 2019-11-24 MED ORDER — LIDOCAINE HCL 1 % IJ SOLN
0.0000 mL | Freq: Once | INTRAMUSCULAR | Status: AC | PRN
  Administered 2019-11-26: 20 mL via INTRADERMAL
  Filled 2019-11-24 (×2): qty 20

## 2019-11-24 MED ORDER — FUROSEMIDE 10 MG/ML IJ SOLN
20.0000 mg | Freq: Once | INTRAMUSCULAR | Status: AC
  Administered 2019-11-24: 20 mg via INTRAVENOUS
  Filled 2019-11-24: qty 2

## 2019-11-24 MED ORDER — SERTRALINE HCL 50 MG PO TABS
50.0000 mg | ORAL_TABLET | Freq: Every day | ORAL | Status: DC
  Administered 2019-11-25 (×2): 50 mg via ORAL
  Filled 2019-11-24 (×2): qty 1

## 2019-11-24 MED ORDER — ETONOGESTREL 68 MG ~~LOC~~ IMPL
68.0000 mg | DRUG_IMPLANT | Freq: Once | SUBCUTANEOUS | Status: AC
  Administered 2019-11-26: 68 mg via SUBCUTANEOUS
  Filled 2019-11-24: qty 1

## 2019-11-24 MED ORDER — NIFEDIPINE ER OSMOTIC RELEASE 30 MG PO TB24
30.0000 mg | ORAL_TABLET | Freq: Every day | ORAL | Status: DC
  Administered 2019-11-24 – 2019-11-26 (×3): 30 mg via ORAL
  Filled 2019-11-24 (×3): qty 1

## 2019-11-24 NOTE — Lactation Note (Signed)
This note was copied from a baby's chart. Lactation Consultation Note  Patient Name: Boy Maury Bamba [id number] Date: 11/24/2019 Reason for consult: Follow-up assessment;Early term 37-38.6wks;Primapara Baby is 31 hours old and has not latched.  Attempts have been made but baby has been sleepy and not showing interest.  Mom is sitting up in chair with baby skin to skin.  Offered assist.  Assisted with positioning baby in football hold.  Mom has very large breasts and rolled cloth diaper placed under breast for support.  Nipples are short shafted.  Hand expression done and one very small drop visible.  Attempted latch but baby unable to grasp tissue.  Allowed baby to suck on gloved finger and good suck noted.  20 mm nipple shield applied.  Baby latched well and sucked off and on for 15 minutes.  A few swallows noted but no colostrum in shield after feeding.  DEBP is set up at bedside.  Instructed to pump both breasts every 3 hours x 15 minutes.  Instructed to feed with cues and at least 8-12 times in 24 hours.  Encouraged to call for assist prn.  Maternal Data    Feeding Feeding Type: Breast Fed  LATCH Score Latch: Repeated attempts needed to sustain latch, nipple held in mouth throughout feeding, stimulation needed to elicit sucking reflex.  Audible Swallowing: A few with stimulation  Type of Nipple: Everted at rest and after stimulation  Comfort (Breast/Nipple): Soft / non-tender  Hold (Positioning): Assistance needed to correctly position infant at breast and maintain latch.  LATCH Score: 7  Interventions Interventions: Breast compression;Assisted with latch;Adjust position;Skin to skin;Support pillows;Breast massage;Hand express  Lactation Tools Discussed/Used Tools: Nipple Shields Nipple shield size: 20 Breast pump type: Double-Electric Breast Pump   Consult Status Consult Status: Follow-up Date: 11/25/19 Follow-up type: In-patient    Huston Foley 11/24/2019, 1:39 PM

## 2019-11-24 NOTE — Progress Notes (Signed)
Subjective: Postpartum Day 1: Cesarean Delivery Patient reports tolerating PO.    Objective: Vital signs in last 24 hours: Temp:  [97.6 F (36.4 C)-101.7 F (38.7 C)] 97.6 F (36.4 C) (02/05 0541) Pulse Rate:  [87-135] 92 (02/05 0541) Resp:  [16-29] 18 (02/05 0541) BP: (107-170)/(47-100) 131/83 (02/05 0541) SpO2:  [94 %-100 %] 97 % (02/05 0541)  Physical Exam:  General: alert, cooperative and appears stated age Lochia: appropriate Uterine Fundus: firm Incision: wound vac in place DVT Evaluation: Negative Homan's sign.  Recent Labs    11/23/19 2026 11/24/19 0626  HGB 11.2* 11.0*  HCT 33.3* 32.7*    Assessment/Plan: Status post Cesarean section. Doing well postoperatively.  Continue current care. Magnesium x 24 hours Begin Procardia 30 xl daily Desires outpt. circ Breast feeding Wants Nexplanon  Denise Barker 11/24/2019, 8:05 AM

## 2019-11-24 NOTE — Progress Notes (Signed)
CSW received consult for hx of marijuana use.  Referral was screened out due to the following: ~MOB had no documented substance use after initial prenatal visit/+UPT. ~MOB had no positive drug screens after initial prenatal visit/+UPT. ~Baby's UDS is negative.  CSW will monitor CDS results and make report to Child Protective Services if warranted.  MOB was referred for history of depression/anxiety. * Referral screened out by Clinical Social Worker because none of the following criteria appear to apply: ~ History of anxiety/depression during this pregnancy, or of post-partum depression following prior delivery. ~ Diagnosis of anxiety and/or depression within last 3 years OR * MOB's symptoms currently being treated with medication and/or therapy. MOB has an active Rx for Zoloft.   Please contact the Clinical Social Worker if needs arise, by [facility] request, or if MOB scores greater than 9/yes to question 10 on [place] Postpartum Depression Screen.  Denise Barker, MSW, LCSW Clinical Social Work [phone]

## 2019-11-24 NOTE — Lactation Note (Signed)
This note was copied from a baby's chart. Lactation Consultation Note Baby 7 hrs old at time of consult. Mom on Mag. Baby very sleepy. Mom has slight edema to areolas. Reverse pressure helpful. Encouraged mom to soften areola tissue before latching. Mom has short shaft everted nipples. Gave hand pump for pre-pumping. Shells given to wear w/bra later today.  Taught mom hand expression. Mom demonstrated. Noted a few tiny dots of colostrum to Lt. Nipple not the Rt. Attempted latching baby to Rt. Breast in football hold. Baby would open mouth wide on breast but wouldn't suckle. Baby has no interest in BF at this time. ETI Newborn behavior, STS, I&O, milk storage, breast massage, supply and demand discussed. Noted baby has recessed chin. Encouraged mom may need chin tug if she notices him not opening wide enough.  Mom shown how to use DEBP & how to disassemble, clean, & reassemble parts. Mom knows to pump q3h for 15-20 min. Mom pumped w/nothing collected.  Mom encouraged to waken baby for feeds. Mom encouraged to feed baby 8-12 times/24 hours and with feeding cues.  Encouraged mom to call for assistance or questions. Lactation brochure given.   Patient Name: Denise Barker Today's Date: 11/24/2019 Reason for consult: Initial assessment;Primapara;Early term 37-38.6wks   Maternal Data Has patient been taught Hand Expression?: Yes Does the patient have breastfeeding experience prior to this delivery?: No  Feeding    LATCH Score Latch: Too sleepy or reluctant, no latch achieved, no sucking elicited.  Audible Swallowing: None  Type of Nipple: Everted at rest and after stimulation  Comfort (Breast/Nipple): Soft / non-tender(slight edema to areola)  Hold (Positioning): Full assist, staff holds infant at breast  LATCH Score: 4  Interventions Interventions: Breast feeding basics reviewed;Support pillows;Assisted with latch;Position options;Skin to skin;Breast massage;Hand  express;Shells;Pre-pump if needed;Reverse pressure;Hand pump;DEBP;Breast compression;Adjust position  Lactation Tools Discussed/Used Tools: Shells;Pump Shell Type: Inverted Breast pump type: Double-Electric Breast Pump;Manual WIC Program: Yes Pump Review: Setup, frequency, and cleaning;Milk Storage Initiated by:: Peri Jefferson RN IBCLC Date initiated:: 11/24/19   Consult Status Consult Status: Follow-up Date: 11/24/19(in pm) Follow-up type: In-patient    Tiwana Chavis, Diamond Nickel 11/24/2019, 3:23 AM

## 2019-11-25 ENCOUNTER — Encounter: Payer: Self-pay | Admitting: *Deleted

## 2019-11-25 DIAGNOSIS — O411290 Chorioamnionitis, unspecified trimester, not applicable or unspecified: Secondary | ICD-10-CM

## 2019-11-25 MED ORDER — OXYCODONE HCL 5 MG PO TABS
10.0000 mg | ORAL_TABLET | ORAL | Status: DC | PRN
  Administered 2019-11-25: 10 mg via ORAL
  Filled 2019-11-25: qty 2

## 2019-11-25 MED ORDER — BREAST PUMP MISC: 1.0000 | 0 refills | Status: DC | PRN

## 2019-11-25 MED ORDER — OXYCODONE HCL 5 MG PO TABS: 5.0000 mg | ORAL_TABLET | ORAL | Status: DC | PRN

## 2019-11-25 NOTE — Progress Notes (Signed)
Prevena negative pressure wound vac, beeping continuously, did trouble shooting, turned it on and off, dressing reinforced. Beeping intermittently now. Will continue to assess.

## 2019-11-25 NOTE — Progress Notes (Signed)
Subjective: Postpartum Day 2: Cesarean Delivery Patient reports incisional pain, tolerating PO, + flatus and no problems voiding.  Baby is doing ok with breastfeeding.   Objective: Vital signs in last 24 hours: Temp:  [97.6 F (36.4 C)-98.3 F (36.8 C)] 98.1 F (36.7 C) (02/06 0545) Pulse Rate:  [89-112] 112 (02/06 0545) Resp:  [16-18] 18 (02/06 0545) BP: (126-157)/(68-83) 146/83 (02/06 0545) SpO2:  [98 %-100 %] 98 % (02/06 0545)  Physical Exam:  General: alert Lochia: appropriate Uterine Fundus: firm at umbilicus -1 Incision: wound vac in place, no erythema  DVT Evaluation: No evidence of DVT seen on physical exam.  Recent Labs    11/23/19 2026 11/24/19 0626  HGB 11.2* 11.0*  HCT 33.3* 32.7*    Assessment/Plan: Status post Cesarean section. Doing well postoperatively.  Continue current care. Continue procardia 30 mg XL daily Continue lovenox to help prevent DVT Plans Nexplanon today  Denise Barker C Izel Eisenhardt 11/25/2019, 7:13 AM

## 2019-11-25 NOTE — Lactation Note (Signed)
This note was copied from a baby's chart. Lactation Consultation Note  Patient Name: Denise Barker [id number] Date: 11/25/2019 Reason for consult: Infant weight loss;Follow-up assessment;Early term 37-38.6wks;Primapara;1st time breastfeeding  Visited with mom of a 26 hours old ETI female, NP Lauren requested LC to visit mom due to weight loss, baby is at 6% and has not been supplemented yet, he's also a poor feeder and keeps falling asleep at the breast with a non-nutritive sucking pattern per RN report.  Mom is still pumping but no longer using the NS #20, she told LC that baby "liked it" at first but that now he doesn't like it anymore and she won't be using it. She's discouraged with pumping because she's only getting a few drops at a time, encouraged her to keep doing in and to keep offering her precious BM to her baby prior offering any formula.   Mom was offered donor milk by NP but she leaned towards formula because baby won't be able to go home/be discharged on donor milk. Baby is still not interested in BF when [facility] offered assistance with latch, but mom aware that she can call her RN/LC if she needs any assistance. Reviewed normal LPI/ETI behavior, feeding cues, newborn weight loss and formula supplementation guidelines.  Feeding plan:  1. Encouraged mom to keep putting baby to breast 8-12 times/24 hours or sooner if feeding cues are present.  2. She'll start pumping consistently every 2-3 hours at least 8 pumping sessions/24 hours 3. Parents will start supplementing baby with Rush Barer formula every 3 hours after feedings, they're also aware that amounts will increase in the next 24 hours.  Dad present and supportive, he was changing baby's diaper during [facility] consultation. Parents reported all questions and concerns were answered, they're both aware of LC OP services and will call PRN.   Maternal Data    Feeding Feeding Type: Breast Fed  LATCH Score                    Interventions Interventions: Breast feeding basics reviewed;DEBP  Lactation Tools Discussed/Used Tools: Pump Breast pump type: Double-Electric Breast Pump   Consult Status Consult Status: Follow-up Date: 11/26/19 Follow-up type: In-patient    Denise Barker Denise Barker 11/25/2019, 1:18 PM

## 2019-11-26 DIAGNOSIS — Z3046 Encounter for surveillance of implantable subdermal contraceptive: Secondary | ICD-10-CM

## 2019-11-26 DIAGNOSIS — O411230 Chorioamnionitis, third trimester, not applicable or unspecified: Secondary | ICD-10-CM | POA: Diagnosis not present

## 2019-11-26 MED ORDER — IBUPROFEN 800 MG PO TABS: 800.0000 mg | ORAL_TABLET | Freq: Three times a day (TID) | ORAL | 0 refills | Status: DC

## 2019-11-26 MED ORDER — NIFEDIPINE ER OSMOTIC RELEASE 30 MG PO TB24: 60.0000 mg | ORAL_TABLET | Freq: Every day | ORAL | Status: DC

## 2019-11-26 MED ORDER — NIFEDIPINE ER OSMOTIC RELEASE 30 MG PO TB24
30.0000 mg | ORAL_TABLET | Freq: Once | ORAL | Status: AC
  Administered 2019-11-26: 30 mg via ORAL
  Filled 2019-11-26: qty 1

## 2019-11-26 MED ORDER — SENNOSIDES-DOCUSATE SODIUM 8.6-50 MG PO TABS: 2.0000 | ORAL_TABLET | ORAL | 0 refills | Status: DC

## 2019-11-26 MED ORDER — HYDROCODONE-ACETAMINOPHEN 5-325 MG PO TABS: 1.0000 | ORAL_TABLET | ORAL | 0 refills | Status: DC | PRN

## 2019-11-26 MED ORDER — NIFEDIPINE ER 60 MG PO TB24: 60.0000 mg | ORAL_TABLET | Freq: Every day | ORAL | 0 refills | Status: DC

## 2019-11-26 NOTE — Progress Notes (Signed)
Pt discharged to home with significant other and newborn son.  Provena dressing changed prior to discharge because original one was no longer sealed and pt did not have patch kit from OR. Dressing was saturated with serosanguinous drainage.  Pt left with dressing sealed and negative pressure working.  Pt plans to rent a breast pump tomorrow from the gift shop and call her insurance to have one sent to her home.  She says she has a hand pump to use tonight until rental is obtained.  Pt ambulated to car with staff.  No other equipment for home ordered at discharge.

## 2019-11-26 NOTE — Lactation Note (Signed)
This note was copied from a baby's chart. Lactation Consultation Note  Patient Name: Denise Barker KSHNG'I Date: 11/26/2019 Reason for consult: Follow-up assessment;Difficult latch;Primapara;1st time breastfeeding;Hyperbilirubinemia;Early term 37-38.6wks  LC in to assist with P1 Mom of ET infant at 40 hrs old.  Baby on phototherapy for elevated bilirubin.  Mom frustrated that baby will fall asleep at the breast after a few minutes. Encouraged STS with bili blanket over baby.  Offer breast before formula supplementation.   Encouraged frequent pumping.  Mom has not expressed anything yet.  Reassured her to continue stimulating her milk supply.  Mom doesn't have a DEBP at home.  Has WIC, faxed [facility] referral for pump at discharge.  Mom aware of [facility] loaner pump available from Lactation and the $30 deposit required.     Interventions Interventions: Breast feeding basics reviewed;Skin to skin;Breast massage;Hand express;DEBP  Lactation Tools Discussed/Used Tools: Pump;Bottle Breast pump type: Double-Electric Breast Pump   Consult Status Consult Status: Follow-up Date: 11/27/19 Follow-up type: In-patient    Judee Clara 11/26/2019, 12:10 PM

## 2019-11-26 NOTE — Procedures (Addendum)
Post-Placental Nexplanon Insertion Procedure Note  Patient was identified. Informed consent was signed, signed copy in chart. A time-out was performed.    The insertion site was identified 8-10 cm (3-4 inches) from the medial epicondyle of the humerus and 3-5 cm (1.25-2 inches) posterior to (below) the sulcus (groove) between the biceps and triceps muscles of the patient's left arm and marked. The site was prepped and draped in the usual sterile fashion. Pt was prepped with alcohol swab and then injected with 1 cc of 1% lidocaine. The site was prepped with betadine. Nexplanon removed form packaging,  Device confirmed in needle, then inserted full length of needle and withdrawn per handbook instructions. Provider and patient verified presence of the implant in the woman's arm by palpation. Pt insertion site was covered with steristrips/adhesive bandage and pressure bandage. There was minimal blood loss. Patient tolerated procedure well.  Patient was given post procedure instructions and Nexplanon user card with expiration date. Condoms were recommended for STI prevention. Patient was asked to keep the pressure dressing on for 24 hours to minimize bruising and keep the adhesive bandage on for 3-5 days. The patient verbalized understanding of the plan of care and agrees.   Lot # [id number] Expiration Date 03/19/2022   Leonides Cave, DO, PGY-1  I saw and evaluated the patient prior to Nexplanon placement. I was immediately available during the procedure.   Jerilynn Birkenhead, MD [facility] Family Medicine Fellow, [facility] for [employer], [facility]  OBGYN Faculty Teaching Service

## 2019-11-27 ENCOUNTER — Encounter (HOSPITAL_COMMUNITY): Payer: Self-pay | Admitting: Obstetrics & Gynecology

## 2019-11-27 LAB — SURGICAL PATHOLOGY

## 2019-11-30 ENCOUNTER — Ambulatory Visit (INDEPENDENT_AMBULATORY_CARE_PROVIDER_SITE_OTHER): Payer: Medicaid Other

## 2019-11-30 ENCOUNTER — Other Ambulatory Visit: Payer: Self-pay

## 2019-11-30 VITALS — BP 138/96 | HR 102

## 2019-11-30 DIAGNOSIS — Z5189 Encounter for other specified aftercare: Secondary | ICD-10-CM

## 2019-11-30 NOTE — Progress Notes (Signed)
Pt here today for wound vac removal.  Pt reports some pain 7 out of 0/10 pain scale. Prevena wound vac removed.  Pt tolerated well.  Incision well approximated- no odor, no drainage, no edema, and no erythema.  Pt advised to allow warm, soapy water to run over incision and to pat dry.  Pt presents with elevated BP 143/93 RA.  Recheck 138/96 LA.  Pt reports that she has not taken her Nifedipine 60 mg tablet yet.  Pt denies any sx's of HTN. Notified Dr. Debroah Loop pt's BP.  No new orders- pt needs to go home and take BP medication.  Pt advised that she needs to continue to monitor for sx's of HTN and to f/u at her pp visit on 12/22/19.  Pt verbalized understanding.   Addison Naegeli, RN 11/30/19

## 2019-12-01 NOTE — Progress Notes (Signed)
Patient ID: [name], female   DOB: 11-27-1985, 35 y.o.   MRN: [id number] Patient seen and assessed by nursing staff during this encounter. I have reviewed the chart and agree with the documentation and plan.  Scheryl Darter, MD 12/01/2019 5:32 PM

## 2019-12-19 ENCOUNTER — Other Ambulatory Visit: Payer: Self-pay

## 2019-12-22 ENCOUNTER — Other Ambulatory Visit: Payer: Self-pay

## 2019-12-22 ENCOUNTER — Other Ambulatory Visit: Payer: Medicaid Other

## 2019-12-22 ENCOUNTER — Ambulatory Visit (INDEPENDENT_AMBULATORY_CARE_PROVIDER_SITE_OTHER): Payer: Medicaid Other | Admitting: Student

## 2019-12-22 DIAGNOSIS — Z1389 Encounter for screening for other disorder: Secondary | ICD-10-CM | POA: Diagnosis not present

## 2019-12-22 NOTE — Addendum Note (Signed)
Addended by: Marjo Bicker on: 12/22/2019 08:35 AM   Modules accepted: Orders

## 2019-12-22 NOTE — Progress Notes (Signed)
Subjective:     Denise Barker is a 34 y.o. female who presents for a postpartum visit. She is 4 weeks postpartum following a Primary Low Transverse Ceseran. I have fully reviewed the prenatal and intrapartum course. The delivery was at [redacted]w[redacted]d gestational weeks. Outcome: primary cesarean section, low transverse incision. Anesthesia: epidural. Postpartum course has been unremarkable. Baby's course has been unremarkable. Baby is feeding by breast. Bleeding no bleeding. Bowel function is normal. Bladder function is normal. Patient is not sexually active. Contraception method is Nexplanon. Postpartum depression screening:negatiive  Patient reports that she was taking 60 mg of Nifedipine daily; she felt like the nifedipine was causing it. She experimented with going off the medicine and felt like her headaches got better. Last time she took the medicine was Wednesday morning; her headaches have improved since then.  The following portions of the patient's history were reviewed and updated as appropriate: allergies, current medications, past family history, past medical history, past social history, past surgical history and problem list.  Review of Systems Pertinent items are noted in HPI.   Objective:    BP 133/82   Pulse 80   Wt 297 lb (134.7 kg)   BMI 42.62 kg/m   General:  alert, cooperative and no distress   Breasts:  negative  Lungs: clear to auscultation bilaterally  Heart:  regular rate and rhythm, S1, S2 normal, no murmur, click, rub or gallop  Abdomen: soft, non-tender; bowel sounds normal; no masses,  no organomegaly   Vulva:  not evaluated  Vagina: not evaluated  Cervix:  Not evaluated  Corpus: not examined  Adnexa:  not evaluated  Rectal Exam: Not performed.        Assessment:    Healthy  postpartum exam. Pap smear 06/01/2019. Nexplanon in place.   Plan:    1. Contraception: Nexplanon 2. 2 hour in process; will inform patient and make referral for DM management if fails 2  hour -Patient has stopped taking nifedpine and feels that her headaches are better; I emphasized that she needs to monitor for headaches for the next two weeks and if she feels that her headache is returning she should check her blood pressure. If elevated (140/90) and she has a HA, she should come to the MAU for evaluation.  3. Follow up as needed.

## 2019-12-23 LAB — GLUCOSE TOLERANCE, 2 HOURS
Glucose, 2 hour: 85 mg/dL (ref 65–139)
Glucose, GTT - Fasting: 95 mg/dL (ref 65–99)

## 2019-12-28 ENCOUNTER — Encounter: Payer: Self-pay | Admitting: Student

## 2020-05-15 IMAGING — US US MFM OB FOLLOW-UP
1 series · 13 of 28 positions shown · non-contrast
Comparison: none

[Series 1: us mfm ob follow-up · 69 acquisitions, 13 frames shown]
[im 3/69]
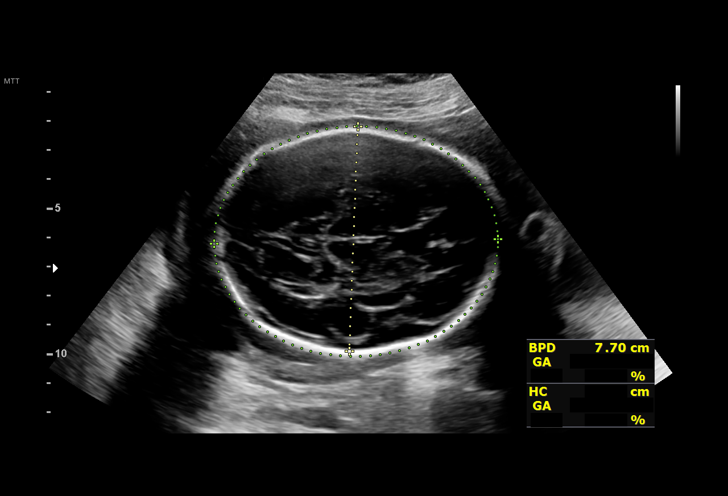
[im 8/69]
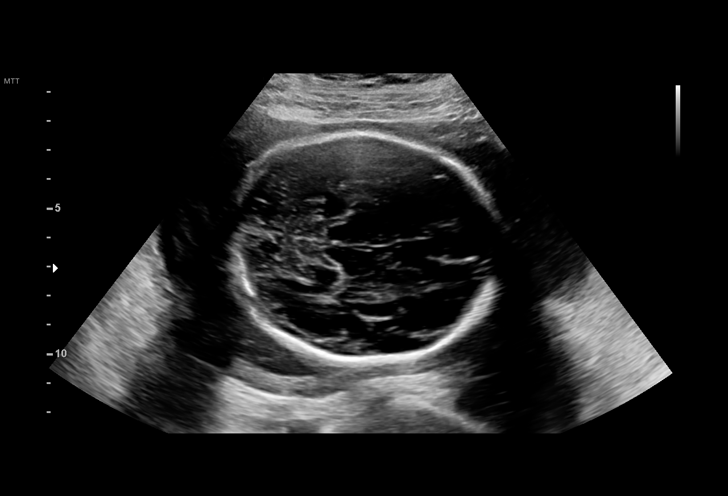
[im 13/69]
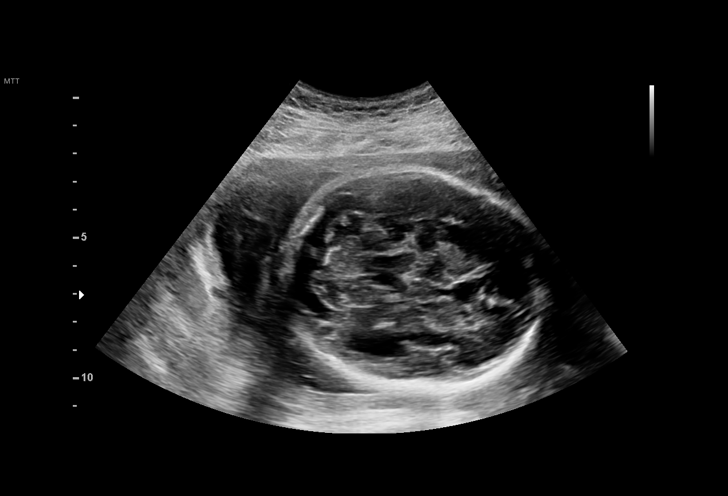
[im 18/69]
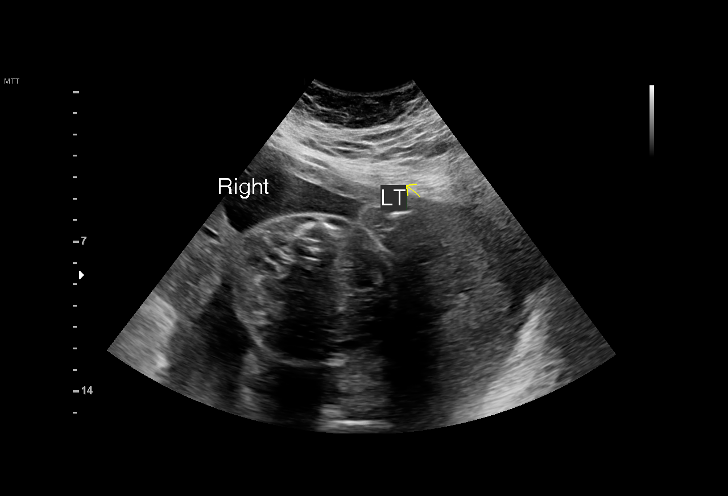
[im 23/69]
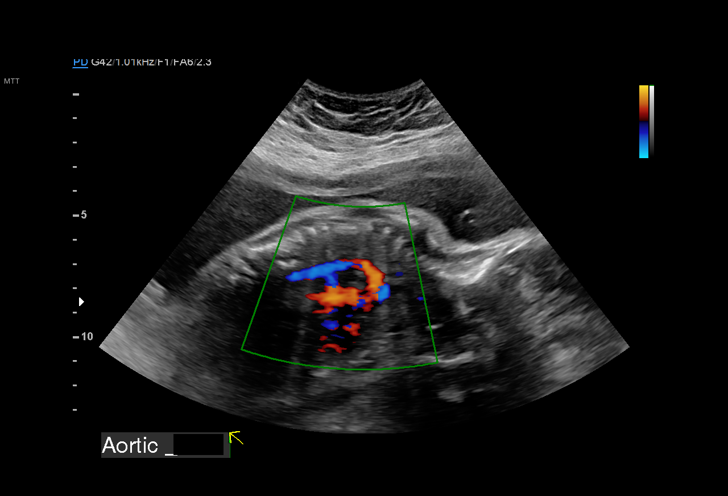
[im 28/69]
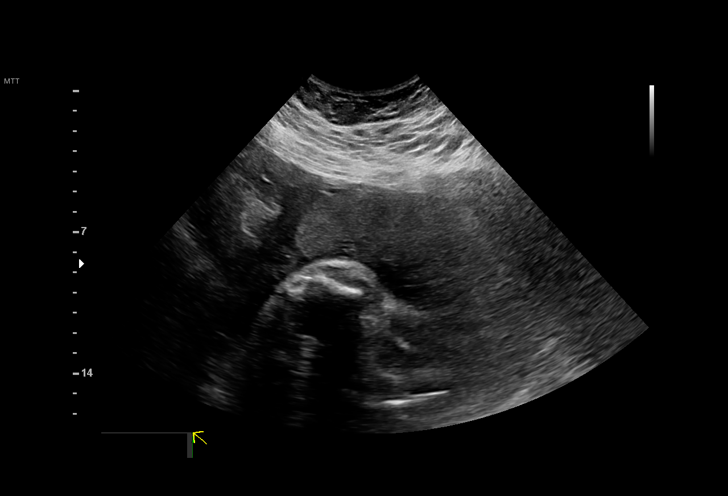
[im 36/69]
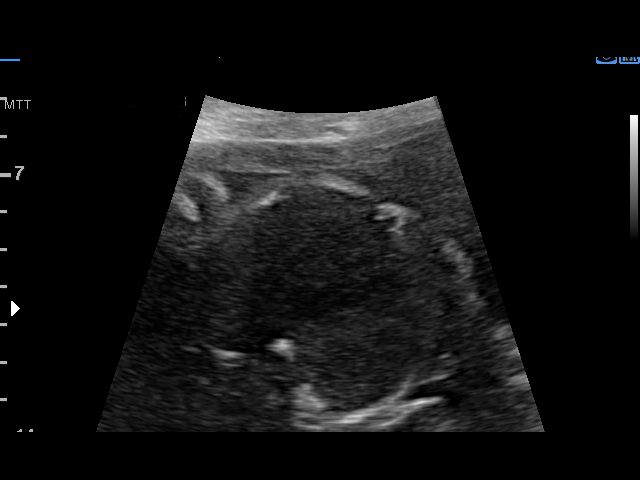
[im 41/69]
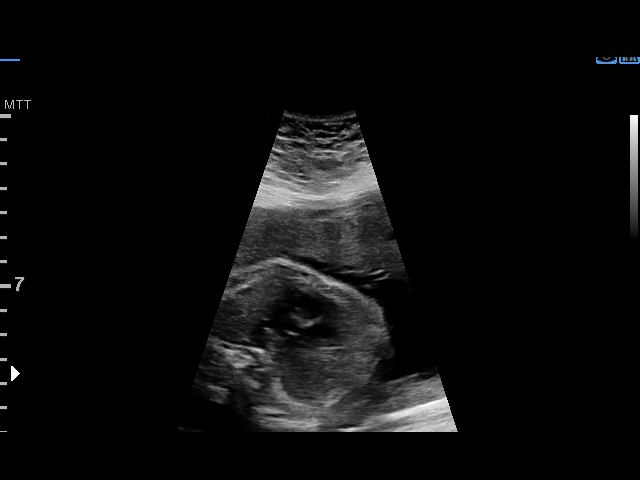
[im 46/69]
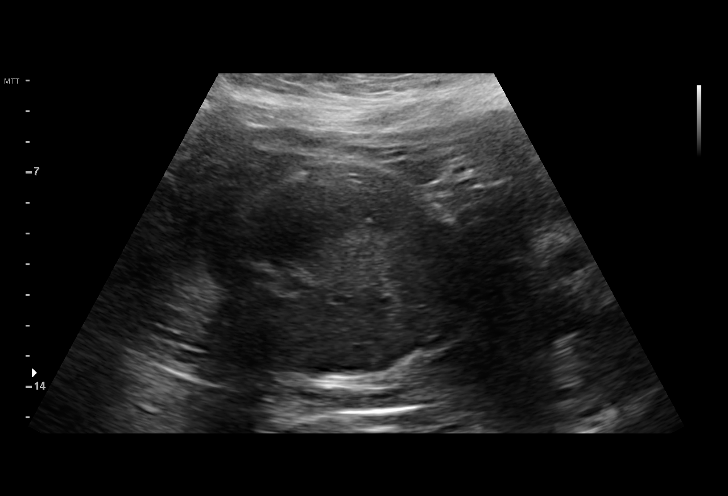
[im 51/69]
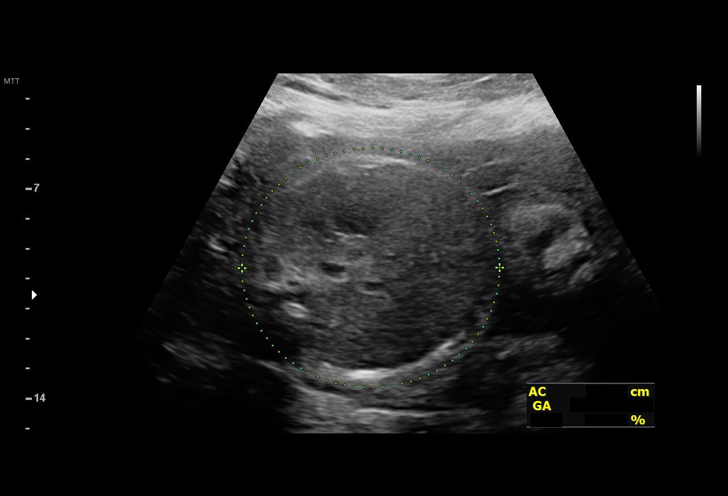
[im 56/69]
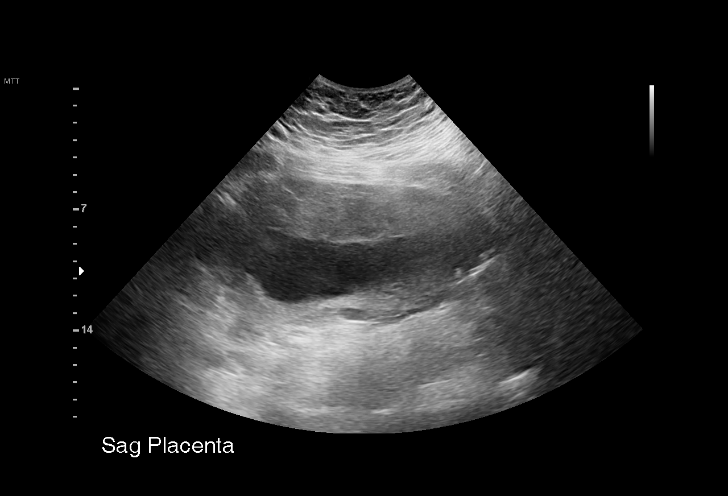
[im 61/69]
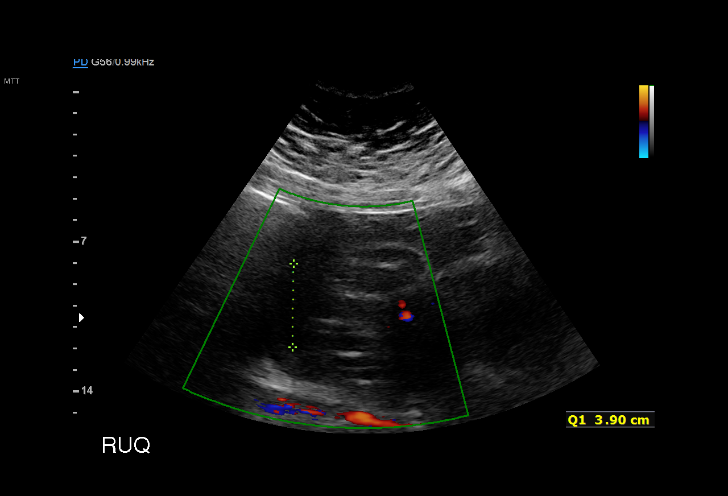
[im 66/69]
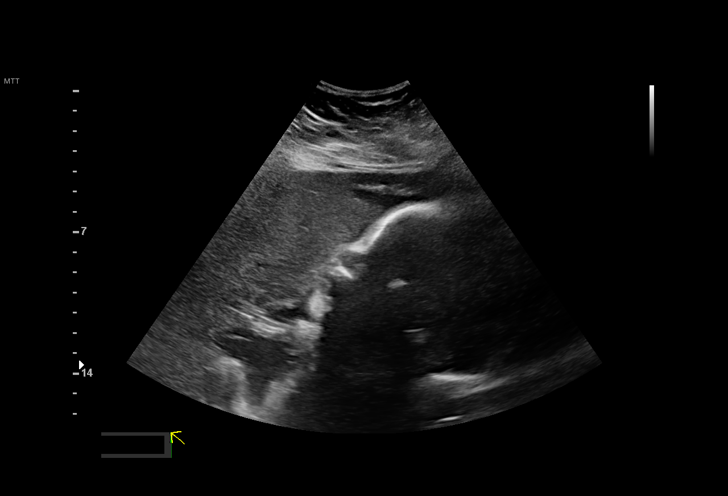

[13 of 28 positions shown; findings below may reference images not displayed]

[address]

 ----------------------------------------------------------------------

 ----------------------------------------------------------------------
Indications

  Obesity complicating pregnancy, third
  trimester
  Fetal abnormality - other known or
  suspected (LVEIF)
  Encounter for other antenatal screening
  follow-up
  Diabetes - Gestational
  Hypertension - Chronic/Pre-existing
  Asthma                                         UFF.VF j91.838
  29 weeks gestation of pregnancy
 ----------------------------------------------------------------------
Fetal Evaluation

 Num Of Fetuses:         1
 Fetal Heart Rate(bpm):  141
 Cardiac Activity:       Observed
 Presentation:           Cephalic
 Placenta:               Anterior
 P. Cord Insertion:      Previously Visualized

 Amniotic Fluid
 AFI FV:      Within normal limits

 AFI Sum(cm)     %Tile       Largest Pocket(cm)
 14.35           49

 RUQ(cm)       RLQ(cm)       LUQ(cm)        LLQ(cm)

Biometry

 BPD:      76.9  mm     G. Age:  30w 6d         77  %    CI:        78.67   %    70 - 86
                                                         FL/HC:      22.2   %    19.2 -
 HC:      274.2  mm     G. Age:  30w 0d         26  %    HC/AC:      1.08        0.99 -
 AC:      253.2  mm     G. Age:  29w 4d         42  %    FL/BPD:     79.1   %    71 - 87
 FL:       60.8  mm     G. Age:  31w 4d         87  %    FL/AC:      24.0   %    20 - 24
 CER:      36.4  mm     G. Age:  31w 3d         75  %

 LV:        3.4  mm

 Est. FW:    1662  gm      3 lb 7 oz     65  %
OB History

 Gravidity:    1         Term:   0        Prem:   0        SAB:   0
 TOP:          0       Ectopic:  0        Living: 0
Gestational Age

 LMP:           29w 4d        Date:  03/03/19                 EDD:   12/08/19
 U/S Today:     30w 4d                                        EDD:   12/01/19
 Best:          29w 4d     Det. By:  LMP  (03/03/19)          EDD:   12/08/19
Anatomy

 Cranium:               Appears normal         LVOT:                   Previously seen
 Cavum:                 Appears normal         Aortic Arch:            Appears normal
 Ventricles:            Appears normal         Ductal Arch:            Appears normal
 Choroid Plexus:        Previously seen        Diaphragm:              Appears normal
 Cerebellum:            Appears normal         Stomach:                Appears normal, left
                                                                       sided
 Posterior Fossa:       Previously seen        Abdomen:                Previously seen
 Nuchal Fold:           Previously seen        Abdominal Wall:         Appears nml (cord
                                                                       insert, abd wall)
 Face:                  Orbits and profile     Cord Vessels:           Previously seen
                        previously seen
 Lips:                  Previously seen        Kidneys:                Appear normal
 Palate:                Not well visualized    Bladder:                Appears normal
 Thoracic:              Previously seen        Spine:                  Limited views
                                                                       appear normal
 Heart:                 Echogenic focus        Upper Extremities:      Previously seen
                        in LV prev.
 RVOT:                  Previously seen        Lower Extremities:      Previously seen

 Other:  Heels and 5th digit previously seen. Nasal bone previously seen.
         Technically difficult due to maternal habitus and fetal position.
Cervix Uterus Adnexa

 Cervix
 Not visualized (advanced GA >99wks)

 Uterus
 No abnormality visualized.
Comments

 This patient was seen for a follow up growth scan due to
 maternal obesity.  The patient reports that she was recently
 diagnosed with A1 gestational diabetes.  The patient is
 scheduled for diabetic teaching soon.  Her blood pressures
 have also been noted to be elevated recently.
 She was informed that the fetal growth and amniotic fluid
 level appears appropriate for her gestational age.
 The implications and management of diabetes in pregnancy
 was discussed in detail with the patient. She was advised that
 our goals for her fingerstick values are fasting values of 90-95
 or less and two-hour postprandials of 120 or less.  Should her
 fingerstick values be above these values, she may have to be
 started on insulin or metformin to help her achieve better
 glycemic control. The patient was advised that getting her
 fingerstick values as close to these goals as possible would
 provide her with the most optimal obstetrical outcome.
 A follow up exam was scheduled in 4 weeks.  The patient
 should be referred back for weekly fetal testing should she
 require medication treatment for either gestational diabetes
 or her blood pressures.

## 2020-06-10 DIAGNOSIS — F411 Generalized anxiety disorder: Secondary | ICD-10-CM | POA: Diagnosis not present

## 2020-06-19 IMAGING — US US MFM OB LIMITED
1 series · 15 of 20 positions shown · non-contrast
Comparison: none

[Series 1: us mfm ob limited · 20 acquisitions, 15 frames shown]
[im 1/20]
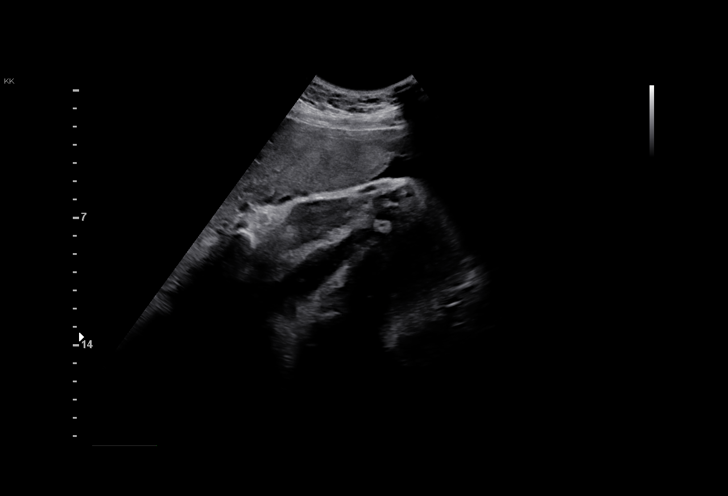
[im 3/20]
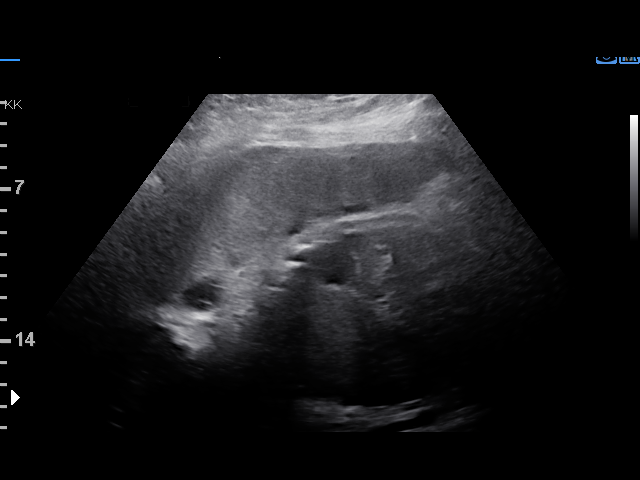
[im 4/20]
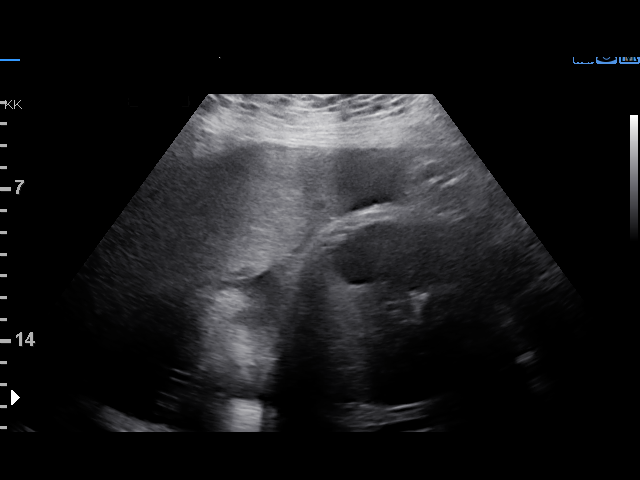
[im 5/20]
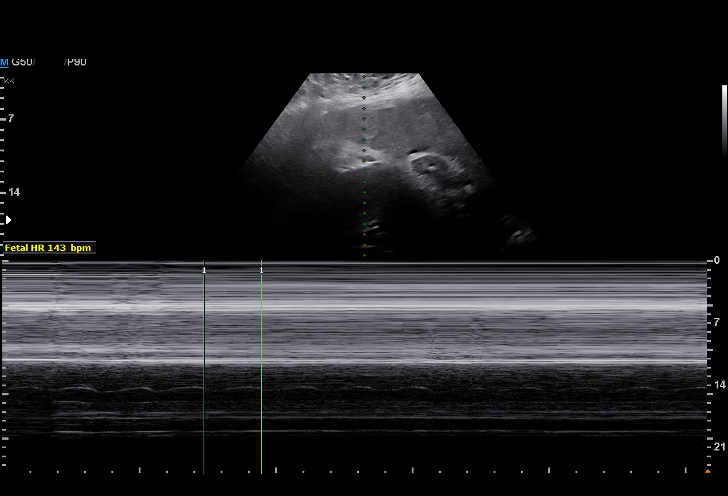
[im 7/20]
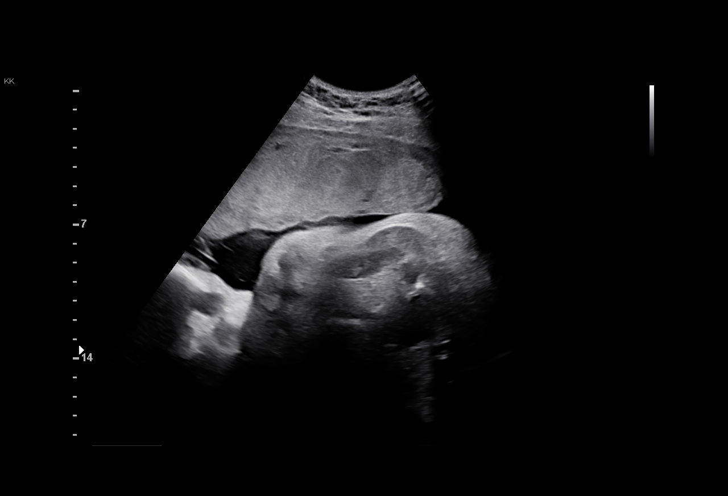
[im 8/20]
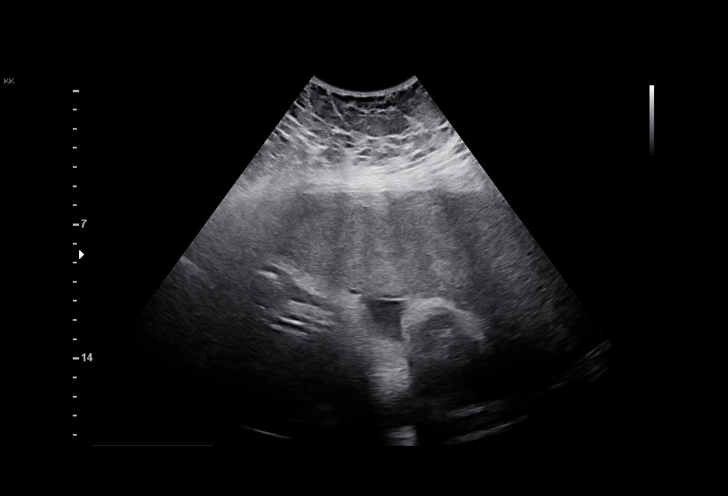
[im 9/20]
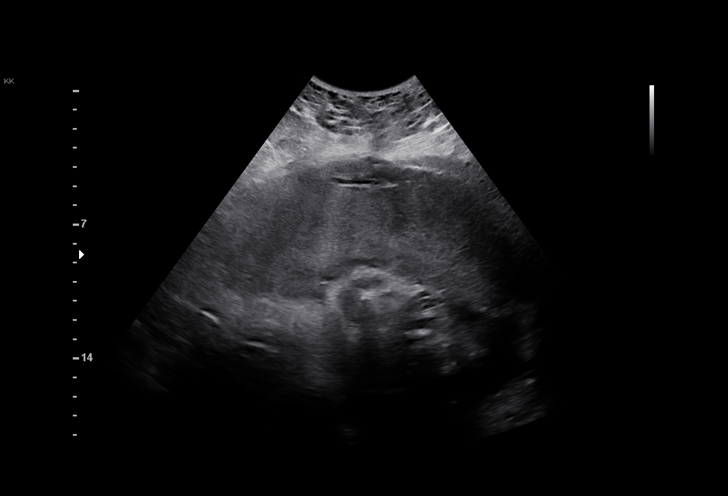
[im 11/20]
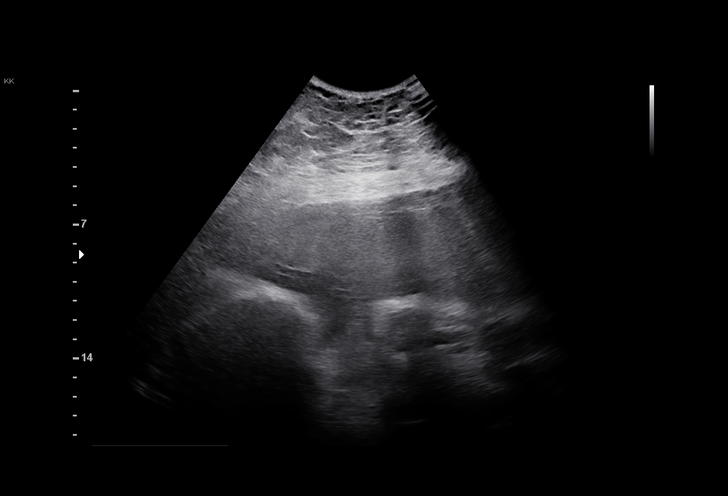
[im 12/20]
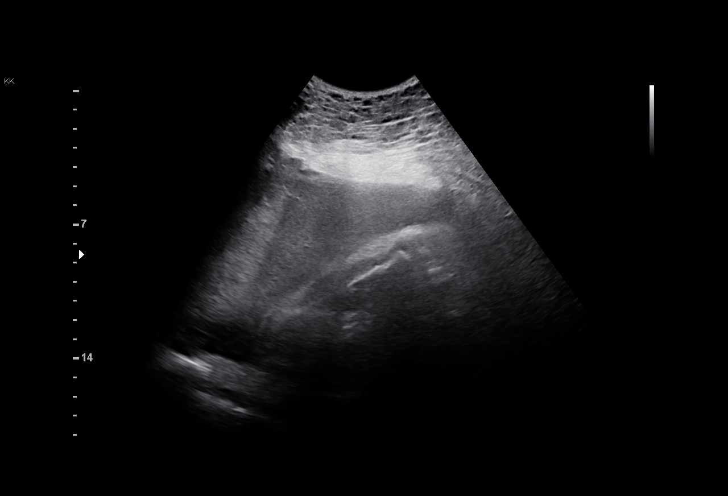
[im 13/20]
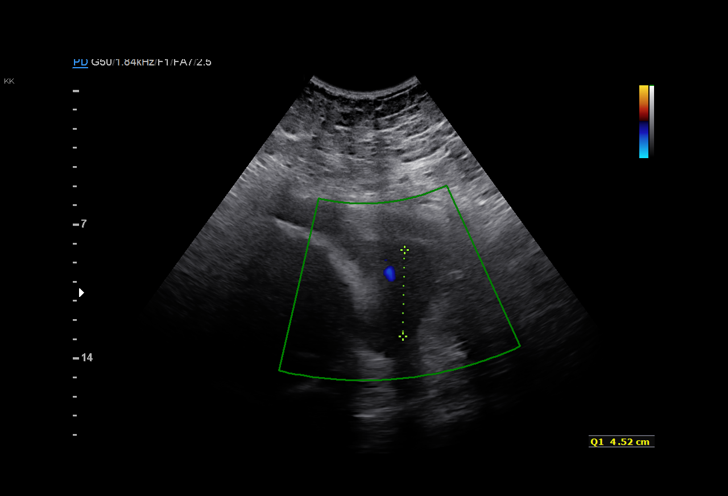
[im 15/20]
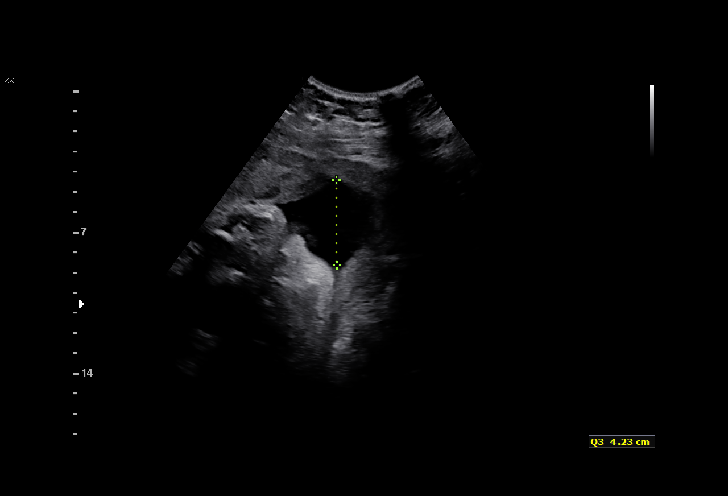
[im 16/20]
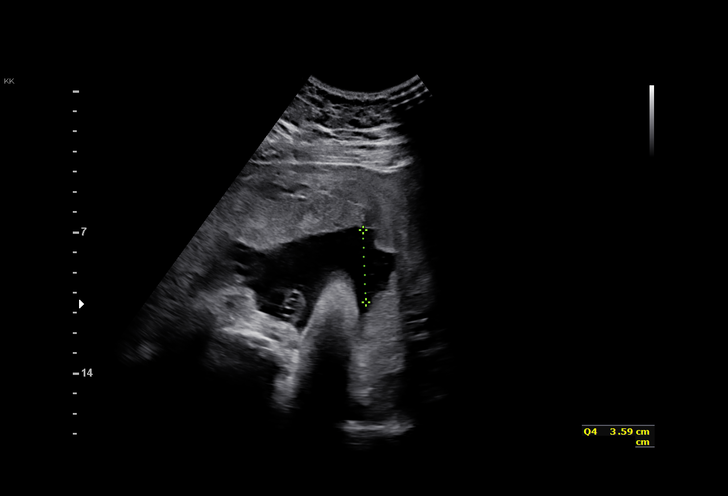
[im 17/20]
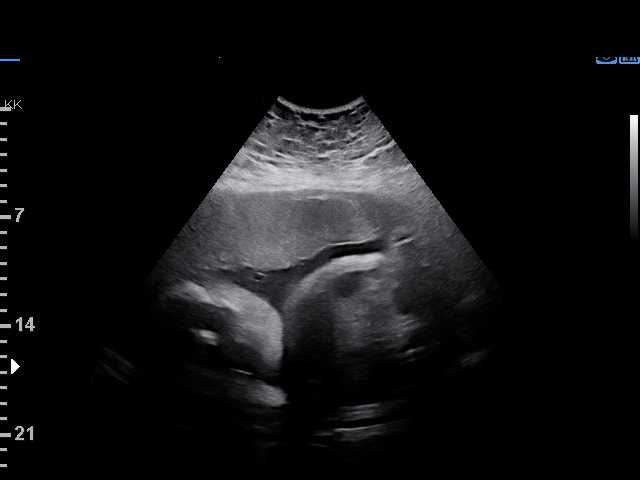
[im 19/20]
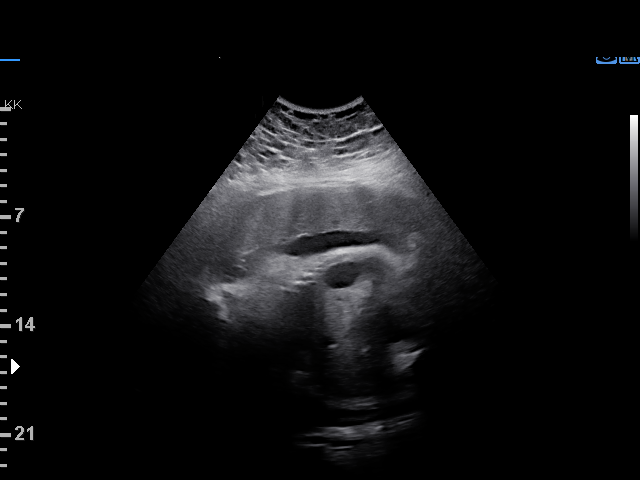
[im 20/20]
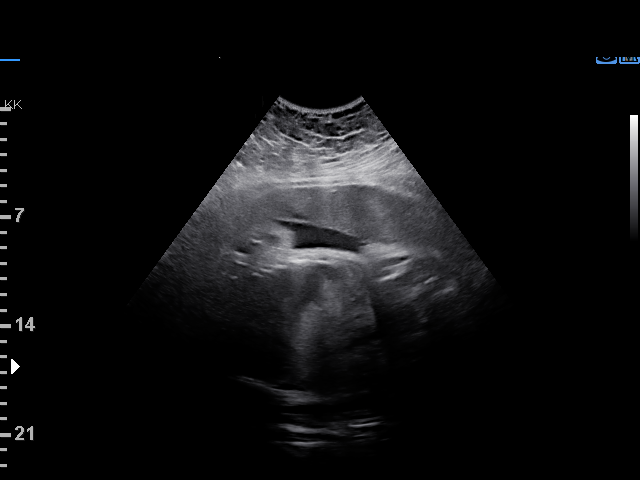

[15 of 20 positions shown; findings below may reference images not displayed]

[address]

  1  US MFM OB LIMITED                    76815.01     JOEBELLE
                                                       MONALISHA
 ----------------------------------------------------------------------

 ----------------------------------------------------------------------
Indications

  Hypertension - Chronic/Pre-existing
  Obesity complicating pregnancy, third
  trimester
  Fetal abnormality - other known or
  suspected (LVEIF)
  Diabetes - Gestational (oral controlled)
  Asthma                                         TMM.AM j88.646
  34 weeks gestation of pregnancy
 ----------------------------------------------------------------------
Fetal Evaluation

 Num Of Fetuses:         1
 Fetal Heart Rate(bpm):  143
 Cardiac Activity:       Observed
 Presentation:           Breech
 Placenta:               Anterior
 P. Cord Insertion:      Previously Visualized

 Amniotic Fluid
 AFI FV:      Within normal limits

 AFI Sum(cm)     %Tile       Largest Pocket(cm)
 8.75            11

 RUQ(cm)                                    LLQ(cm)

OB History

 Gravidity:    1         Term:   0        Prem:   0        SAB:   0
 TOP:          0       Ectopic:  0        Living: 0
Gestational Age

 LMP:           34w 4d        Date:  03/03/19                 EDD:   12/08/19
 Best:          34w 4d     Det. By:  LMP  (03/03/19)          EDD:   12/08/19
Comments

 This patient was seen for a limited ultrasound to check the
 amniotic fluid due to a history of gestational diabetes and
 chronic hypertension.

 The total AFI of 8.75 cm is within normal limits today.  The
 patient also had a reactive nonstress test today.

 Fetal breathing movements and fetal movements were noted
 on today's ultrasound exam.

 She will return in 1 week for fetal testing.

## 2020-06-20 DIAGNOSIS — F411 Generalized anxiety disorder: Secondary | ICD-10-CM | POA: Diagnosis not present

## 2020-07-23 DIAGNOSIS — F411 Generalized anxiety disorder: Secondary | ICD-10-CM | POA: Diagnosis not present

## 2020-08-06 DIAGNOSIS — F411 Generalized anxiety disorder: Secondary | ICD-10-CM | POA: Diagnosis not present

## 2020-08-08 ENCOUNTER — Encounter: Payer: Self-pay | Admitting: Student

## 2020-08-08 ENCOUNTER — Ambulatory Visit (INDEPENDENT_AMBULATORY_CARE_PROVIDER_SITE_OTHER): Payer: Medicaid Other | Admitting: Student

## 2020-08-08 ENCOUNTER — Other Ambulatory Visit: Payer: Self-pay

## 2020-08-08 VITALS — Wt 327.3 lb

## 2020-08-08 DIAGNOSIS — Z3046 Encounter for surveillance of implantable subdermal contraceptive: Secondary | ICD-10-CM

## 2020-08-08 DIAGNOSIS — Z1331 Encounter for screening for depression: Secondary | ICD-10-CM

## 2020-08-08 NOTE — Progress Notes (Signed)
Positive PHQ-9 today; with SI. Pt denies any plan for harm. Pt reports receiving mental health care through [facility] with counselor Pollie Friar. Would like to speak with CNM today about mental health medications.  Fleet Contras RN 08/08/20

## 2020-08-08 NOTE — Progress Notes (Signed)
Patient ID: [name], female   DOB: [birth year], 34 y.o.   MRN: [id number]  History:  [name] is a 34 y.o. G1P1001 who presents to clinic today for nexplanon removal. She is reporting increased depression and anxiety and is crying more. She has a history of depression and anxiety. She is taking care of her infant for long stretches of time and, being new to the area, has not made friends. Was given Zoloft but did not take because it makes her sleepy. She thinks her nexplanon is causing her mood swings. Does not know what other BC she could take.   The following portions of the patient's history were reviewed and updated as appropriate: allergies, current medications, family history, past medical history, social history, past surgical history and problem list.  Review of Systems:  Review of Systems  Constitutional: Negative.   HENT: Negative.   Respiratory: Negative.   Cardiovascular: Negative.   Skin: Negative.   Neurological: Negative.   Psychiatric/Behavioral: Negative.       Objective:  Physical Exam Wt (!) 327 lb 4.8 oz (148.5 kg)    Breastfeeding No    BMI 46.96 kg/m  Physical Exam Constitutional:      Appearance: Normal appearance.  HENT:     Head: Normocephalic.  Neurological:     Mental Status: She is alert.       Labs and Imaging No results found for this or any previous visit (from the past 24 hour(s)).  No results found.   Assessment & Plan:   Encounter for surveillance of Nexplanon subdermal contraceptive  -Patient report significant depression and anxiety, list of PCPS given to see and restart medication.  -Patient would like to try behavioral health/counseling and meds before taking out nexplanon.   -Referral made to Integrated Behavioral Health Approximately 15 minutes of total time was spent with this patient on counseling  Marylene Land, CNM 08/08/2020 10:41 AM

## 2020-08-13 DIAGNOSIS — F411 Generalized anxiety disorder: Secondary | ICD-10-CM | POA: Diagnosis not present

## 2020-08-19 DIAGNOSIS — F411 Generalized anxiety disorder: Secondary | ICD-10-CM | POA: Diagnosis not present

## 2020-08-27 NOTE — BH Specialist Note (Signed)
Integrated Behavioral Health via Telemedicine Video (Caregility) Visit  08/27/2020 Denise Barker [id number]  Number of Integrated Behavioral Health visits: 1  Session Start time: 9:15  Session End time: 9:51 Total time: 36 minutes  Referring Provider: Luna Barker, CNM Type of Service: Individual Patient/Family location: Home [facility] Provider location: [facility]  All persons participating in visit: [name] and Denise Barker     I connected with [name]  by a video enabled telemedicine application Public affairs consultant) and verified that I am speaking with the correct person using two identifiers.   Discussed confidentiality: Yes   Confirmed demographics & insurance:  Yes   I discussed that engaging in this virtual visit, they consent to the provision of behavioral healthcare and the services will be billed under their insurance.   Patient and/or legal guardian expressed understanding and consented to virtual visit: Yes   PRESENTING CONCERNS: Patient and/or family reports the following symptoms/concerns: Pt states her primary concern today is gaining 40 lbs since starting Nexplanon, along with weekly panic attacks and depression, paranoia, fatigue, and lack of quality sleep; sleep issue attributed to son waking up 3x/night. Pt is seeing a therapist weekly, and will establish with a PCP tomorrow via mobile clinic. Pt says she stopped taking Zoloft as it made her too sleepy, and needs to be able to be more alert at night when her son wakes up.  Duration of problem: Increase in past year; Severity of problem: moderately severe  STRENGTHS (Protective Factors/Coping Skills): Social connections and Concrete supports in place (healthy food, safe environments, etc.)  ASSESSMENT: Patient currently experiencing Mood disorder, unspecified.    GOALS ADDRESSED: Patient will: 1.  Reduce symptoms of: anxiety, depression and stress   2.  Increase knowledge and/or ability of: healthy habits and stress reduction  3.  Demonstrate ability to: Increase healthy adjustment to current life circumstances and Increase motivation to adhere to plan of care   Progress of Goals: Ongoing  INTERVENTIONS: Interventions utilized:  Solution-Focused Strategies and Psychoeducation and/or Health Education Standardized Assessments completed & reviewed: Not given today   OUTCOME: Patient Response: Pt agrees to treatment plan   PLAN: 1. Follow up with behavioral health clinician on : Two weeks 2. Behavioral recommendations:  -Begin taking at least 20 minute walk outdoors daily with son every morning -Consider longer walk at a local park (maybe [facility]) once weekly -Consider 10-20 minute nap during baby's daytime naptime -Continue seeing therapist weekly -Continue with plan to establish with new PCP tomorrow via mobile clinic; discuss with PCP medication for depression and anxiety 3. Referral(s): Integrated [employer] (In Clinic)  I discussed the assessment and treatment plan with the patient and/or parent/guardian. They were provided an opportunity to ask questions and all were answered. They agreed with the plan and demonstrated an understanding of the instructions.   They were advised to call back or seek an in-person evaluation as appropriate.  I discussed that the purpose of this visit is to provide behavioral health care while limiting exposure to the novel coronavirus.  Discussed there is a possibility of technology failure and discussed alternative modes of communication if that failure occurs.  Denise Barker   Depression screen [facility] 2/9 08/08/2020 11/13/2019 07/12/2019 05/25/2019  Decreased Interest 1 0 3 3  Down, Depressed, Hopeless 1 0 3 1  PHQ - 2 Score 2 0 6 4  Altered sleeping 2 3 3 2   Tired, decreased energy 2  3 3 3   Change in appetite 1 0 3 2  Feeling bad or failure about yourself  2 0 3 0   Trouble concentrating 0 0 0 0  Moving slowly or fidgety/restless 0 0 0 0  Suicidal thoughts 1 0 1 0  PHQ-9 Score 10 6 19 11   Difficult doing work/chores - - Extremely dIfficult -   GAD 7 : Generalized Anxiety Score 08/08/2020 11/13/2019 07/12/2019 05/25/2019  Nervous, Anxious, on Edge 2 0 3 1  Control/stop worrying 1 0 3 0  Worry too much - different things 1 0 3 1  Trouble relaxing 1 0 3 1  Restless 0 0 3 0  Easily annoyed or irritable 2 0 1 2  Afraid - awful might happen 2 0 3 2  Total GAD 7 Score 9 0 19 7  Anxiety Difficulty - - Extremely difficult -

## 2020-09-02 ENCOUNTER — Ambulatory Visit: Payer: Medicaid Other | Admitting: Clinical

## 2020-09-02 ENCOUNTER — Other Ambulatory Visit: Payer: Self-pay

## 2020-09-02 DIAGNOSIS — F39 Unspecified mood [affective] disorder: Secondary | ICD-10-CM

## 2020-09-02 NOTE — Patient Instructions (Signed)
[facility] [address] [place], [place] [address] [phone] (main office) [phone] (Jannice Beitzel's office)   

## 2020-09-05 NOTE — BH Specialist Note (Addendum)
Integrated Behavioral Health via Telemedicine Video (Caregility) Visit  09/05/2020 Denise Barker [id number]  Number of Integrated Behavioral Health visits: 2 Session Start time: 3:15  Session End time: 3:27 Total time: 12 minutes  Referring Provider: Lacey Barker, CNM Type of Service: Individual Patient/Family location: Home [facility] Provider location: Center for [facility]  All persons participating in visit:[name] and [name]     I connected with [name]  by a video enabled telemedicine application Public affairs consultant) and verified that I am speaking with the correct person using two identifiers.   Discussed confidentiality: Yes   Confirmed demographics & insurance:  Yes   I discussed that engaging in this virtual visit, they consent to the provision of behavioral healthcare and the services will be billed under their insurance.   Patient and/or legal guardian expressed understanding and consented to virtual visit: Yes   PRESENTING CONCERNS: Patient and/or family reports the following symptoms/concerns: Pt states her primary concern today remains gaining weight since beginning Nexplanon, "worst panic attack of my life" last week, increasing fatigue, depression that "feels different" than prior to starting Nexplanon, and continued lack of quality sleep due to baby's sleep schedule. Pt is interested in finding therapist she can see weekly (current at every 2-3 weeks).  Duration of problem: Increasing postpartum, after birth control; Severity of problem: moderately severe  STRENGTHS (Protective Factors/Coping Skills): Social connections and Concrete supports in place (healthy food, safe environments, etc.)  ASSESSMENT: Patient currently experiencing Mood disorder, unspecified.    GOALS ADDRESSED: Patient will: 1.  Reduce symptoms of: anxiety, depression and stress  2.  Demonstrate ability to: Increase healthy adjustment to  current life circumstances and Increase adequate support systems for patient/family   Progress of Goals: Ongoing  INTERVENTIONS: Interventions utilized:  Supportive Counseling, Psychoeducation and/or Health Education and Link to [employer] Standardized Assessments completed & reviewed: Not Needed   OUTCOME: Patient Response: Pt agrees to updated treatment plan   PLAN: 1. Follow up with behavioral health clinician on : Contact Leeasia Secrist at [phone] as needed 2. Behavioral recommendations:  -Attend mobile clinic to establish with new PCP today; discuss with PCP medication for depression and anxiety -Discuss birth control options at visit with gynecology on 10/02/20 -Continue seeing current therapist at available schedule; consider additional agencies for ongoing therapy (on AVS; be aware that the need for therapists have increased during COVID, and may be more difficult to find weekly session availability) -Continue to take naps as needed during baby's naptime -Continue to consider brief outdoor daily walks with son; longer walks as able 3. Referral(s): Integrated [employer] (In Clinic)  I discussed the assessment and treatment plan with the patient and/or parent/guardian. They were provided an opportunity to ask questions and all were answered. They agreed with the plan and demonstrated an understanding of the instructions.   They were advised to call back or seek an in-person evaluation as appropriate.  I discussed that the purpose of this visit is to provide behavioral health care while limiting exposure to the novel coronavirus.  Discussed there is a possibility of technology failure and discussed alternative modes of communication if that failure occurs.  Valetta Close Fayne Mcguffee

## 2020-09-17 ENCOUNTER — Ambulatory Visit: Payer: Medicaid Other | Admitting: Clinical

## 2020-09-17 DIAGNOSIS — F39 Unspecified mood [affective] disorder: Secondary | ICD-10-CM

## 2020-09-17 NOTE — Patient Instructions (Signed)
Behavioral Health Resources:   What if I or someone I know is in crisis?  . If you are thinking about harming yourself or having thoughts of suicide, or if you know someone who is, seek help right away.  . Call your doctor or mental health care provider.  . Call 911 or go to a hospital emergency room to get immediate help, or ask a friend or family member to help you do these things; IF YOU ARE IN [place], YOU MAY GO TO WALK-IN URGENT CARE 24/7 at [facility] (see below)  . Call the [place] National Suicide Prevention Lifeline's toll-free, 24-hour hotline at 1-800-273-TALK (913)751-9539) or TTY: 1-800-799-4 TTY (681) 649-6760) to talk to a trained counselor.  . If you are in crisis, make sure you are not left alone.   . If someone else is in crisis, make sure he or she is not left alone   24 Hour :   [facility]  [address] [place], [place] [address] [phone] or [place] 24/7  [facility] Mobile Crisis: [phone]  [place] National Suicide Hotline: 772-385-9177  [facility] [employer] (Domestic Violence, Rape & Victim Assistance)  (848)656-8179  [place]  [address], [place]  [address]   (313)209-3544 or (724) [phone]   [place]: [phone] (8am-4pm) or [phone] (after hours)        [facility], [address], [place], [place] Fax: [phone] gu[place]careinmind.com *Interpreters available *Accepts all insurance and uninsured for Urgent Care needs *Accepts Medicaid and uninsured for outpatient treatment   [facility]   Mon-Fri: 8am-5pm [place], [place], [place]); [phone]) BloggerCourse.com  *Accepts Medicare  [facility] Osker Mason, Fri: 8am-4pm [place], [place], [place] [address] [phone] (phone); [phone] (fax) TaskTown.es  *[place] Mon-Fri: 9am-5pm  [address] [place], [place] (phone); [phone]  [email or web address].bond-[email or web address]  *Accepts Medicaid  [facility] Blossom [facility] [address] Johnette Abraham [place], [place] SalonLookup.es   [facility] of the [place], 8:30am-12pm/1pm-2:30pm [address], [place], [place] (phone); [phone] (fax) www.fspcares.org  *Accepts Medicaid, sliding-scale*Bilingual services available  [facility] Mon-Fri, 8am-7pm [place], [place]  [phone](phone); [phone]) www.famsolutions.org  *Accepts Medicaid *Bilingual services available  [facility] Mon-Fri: 8am-5pm, Saturday by appointment only [place], [place], [place] (phone); [phone] (fax) www.[email or web address]   [facility] [address], [place], [place], [place] www.[email or web address]  *Free & reduced services for uninsured and underinsured individuals *Bilingual services for Spanish-speaking clients 21 and under  [facility], [address] [place], [place]); [phone]) RunningConvention.de  *Bring your own interpreter at first visit *Accepts Medicare and [facility]  [place] Mon-Fri: 9am-5:30pm [address], [place], [place], [place] (phone), [phone] (fax) After hours crisis line: [phone] www.neuropsychcarecenter.com  *Accepts Medicare and Medicaid  [employer], 8am-6pm [address] [place], [place] (phone);  [phone] (fax) http://presbyteriancounseling.org  *Subsidized costs available  [facility] Mon-Fri: 8am-4pm [address], [place], [place] [phone](phone); [phone]) www.psychotherapeuticservices.com  *Accepts Medicaid  [facility] Same day access hours: Mon-Fri, 8:30-3pm Crisis hours: Mon-Fri, 8am-5pm [address] [place], [place] [phone]  [facility] [employer] Same day access hours: Mon-Fri,  8:30-3pm Crisis hours: Mon-Fri, 8am-8pm [address], [place], [place] [phone] (phone); [phone] (fax) www.rhahealthservices.org  *Accepts Medicaid and Medicare  [facility] Mon, Wed, Fri: 9am-9pm Tues, Thurs: 9am-6pm [address], [place], [place]  [phone] (phone); [phone] (fax) https://[email or web address]  *(Accepts Medicare and Medicaid; payment plans available)*Bilingual services available  [facility] [address], [place], [place] [phone] (phone); [phone] (fax) www.[email or web address]   [facility] [address], [place], [place]  [phone]  www.[email or web address]  *Bilingual services available  [facility]) Mon-Thurs: 8am-8pm [address], [place], [place] [phone] (phone); [phone] (fax) https://[email or web address]/index.[email or web address]  *Accepts Medicaid*Bilingual services available  [facility] [address] [place], [place] [phone] (phone) https://[email or web address]  *Accepts Medicaid *Bilingual services available  [facility] Mon-Fri, 9am-4:45pm [address], [place], [place] [phone] (phone); [phone] (fax) http://tlc-[email or web address]  *Accepts Medicare  [facility] Mon-Thurs: 8:30-8pm, Fri: 8:30am-7pm [address], [place], [place] (3rd floor) [phone] (phone); [phone]  (fax) http://[email or web address].uncg.[email or web address]/[email or web address]  *Accepts Medicaid; income-based reduced rates available  [facility] Mon-Fri: 8am-5pm [address], [place], [place] [address] [phone] (phone); [phone] (fax) http://www.[email or web address]  *Accepts Medicaid*Bilingual services available   [facility] ([facility] [place])  [address], [place] [phone] www.mhag.org  *Provides direct services to individuals in recovery from mental illness, including support groups, recovery skills classes, and one on one peer support  NAMI (National Alliance on Mental Illness) [place] NAMI helpline: 336-370-4264  NAMI [place] helpline: 1-800-451-9682 https://namiguilford.org  *A community hub for information relating to local resources and services for the friends and families of individuals living alongside a mental health condition, as well as the individuals themselves. Classes and support groups also provided    

## 2020-10-02 ENCOUNTER — Ambulatory Visit: Payer: Medicaid Other | Admitting: Student

## 2020-10-09 ENCOUNTER — Encounter: Payer: Self-pay | Admitting: *Deleted

## 2020-11-07 ENCOUNTER — Encounter: Payer: Self-pay | Admitting: Student

## 2020-11-07 ENCOUNTER — Other Ambulatory Visit: Payer: Self-pay

## 2020-11-07 ENCOUNTER — Ambulatory Visit (INDEPENDENT_AMBULATORY_CARE_PROVIDER_SITE_OTHER): Payer: Medicaid Other | Admitting: Student

## 2020-11-07 VITALS — Ht 70.0 in | Wt 313.3 lb

## 2020-11-07 DIAGNOSIS — F4323 Adjustment disorder with mixed anxiety and depressed mood: Secondary | ICD-10-CM | POA: Diagnosis not present

## 2020-11-07 NOTE — Progress Notes (Signed)
Pt still not sure if she wants to keep the Nexplanon or not, wanted to discuss further with the provider.Patient advised that she never started Meds for her Mood swings.

## 2020-11-07 NOTE — Progress Notes (Signed)
Patient ID: [name], female   DOB: 12-08-1985, 35 y.o.   MRN: [id number]  History:  Ms. Brietta Manso is a 35 y.o. G1P1001 who presents to clinic today for follow up for birth control. She feels that she has gained weight since she had NExplanon placed in Feb 2021. She feels like her stomach "hurts" all the time, feels like she has no energy. When she feels depressed, she wants to sleep all day. THis has been consistent over the whole year. When she is depressed she finds it hard to focus on her baby. She denies suicidal ideations.  She is not seeing her therapist and has not been taking anti-depressant medicines.  The following portions of the patient's history were reviewed and updated as appropriate: allergies, current medications, family history, past medical history, social history, past surgical history and problem list.  Review of Systems:  Review of Systems  Constitutional: Negative.   HENT: Negative.   Respiratory: Negative.   Neurological: Negative.   Psychiatric/Behavioral: Positive for depression. The patient is nervous/anxious.       Objective:  Physical Exam Ht 5\' 10"  (1.778 m)   Wt (!) 313 lb 4.8 oz (142.1 kg)   BMI 44.95 kg/m  Physical Exam Pulmonary:     Effort: Pulmonary effort is normal.  Musculoskeletal:        General: Normal range of motion.  Skin:    General: Skin is warm and dry.  Neurological:     General: No focal deficit present.     Mental Status: She is alert.       Labs and Imaging No results found for this or any previous visit (from the past 24 hour(s)).  No results found.   Assessment & Plan:   1. Adjustment reaction with anxiety and depression    Patient does not desire pregnancy; through shared decision making we discussed keeping nexplanon in place and try to find solution for depression and anxiety. Appt made for PCP visit on Monday at [facility] medicine. She needs someone to discuss her abdominal pain with and to dsicuss medication  management for depression.  Patient declined to see [facility]; however, will have Asher Muir reach out to patient to discuss options.    Approximately 25 minutes of total time was spent with this patient on counseling and care.   Asher Muir, CNM 11/07/2020 10:23 AM

## 2020-11-07 NOTE — Patient Instructions (Signed)
1. Try contacting [facility] directly 2. Try a search for [email or web address]

## 2020-11-11 ENCOUNTER — Ambulatory Visit: Payer: Self-pay | Admitting: Family Medicine

## 2020-12-10 ENCOUNTER — Encounter (HOSPITAL_COMMUNITY): Payer: Self-pay

## 2020-12-10 ENCOUNTER — Emergency Department (HOSPITAL_COMMUNITY)
Admission: EM | Admit: 2020-12-10 | Discharge: 2020-12-11 | Disposition: A | Discharge status: Home / Self Care | Payer: Medicaid Other | Attending: Emergency Medicine | Admitting: Emergency Medicine

## 2020-12-10 ENCOUNTER — Other Ambulatory Visit: Payer: Self-pay

## 2020-12-10 DIAGNOSIS — R030 Elevated blood-pressure reading, without diagnosis of hypertension: Secondary | ICD-10-CM | POA: Diagnosis not present

## 2020-12-10 DIAGNOSIS — F0390 Unspecified dementia without behavioral disturbance: Secondary | ICD-10-CM | POA: Diagnosis not present

## 2020-12-10 DIAGNOSIS — K0889 Other specified disorders of teeth and supporting structures: Secondary | ICD-10-CM | POA: Diagnosis not present

## 2020-12-10 DIAGNOSIS — J45909 Unspecified asthma, uncomplicated: Secondary | ICD-10-CM | POA: Diagnosis not present

## 2020-12-10 DIAGNOSIS — K029 Dental caries, unspecified: Secondary | ICD-10-CM | POA: Diagnosis not present

## 2020-12-10 DIAGNOSIS — Z8616 Personal history of COVID-19: Secondary | ICD-10-CM | POA: Insufficient documentation

## 2020-12-10 NOTE — ED Triage Notes (Signed)
Right upper dental decay.

## 2020-12-11 MED ORDER — AMOXICILLIN 500 MG PO CAPS
1000.0000 mg | ORAL_CAPSULE | Freq: Once | ORAL | Status: AC
  Administered 2020-12-11: 1000 mg via ORAL
  Filled 2020-12-11: qty 2

## 2020-12-11 MED ORDER — HYDROCODONE-ACETAMINOPHEN 5-325 MG PO TABS: 1.0000 | ORAL_TABLET | ORAL | 0 refills | Status: DC | PRN

## 2020-12-11 MED ORDER — NAPROXEN 500 MG PO TABS
500.0000 mg | ORAL_TABLET | Freq: Once | ORAL | Status: AC
  Administered 2020-12-11: 500 mg via ORAL
  Filled 2020-12-11: qty 1

## 2020-12-11 MED ORDER — HYDROCODONE-ACETAMINOPHEN 5-325 MG PO TABS
1.0000 | ORAL_TABLET | Freq: Once | ORAL | Status: AC
  Administered 2020-12-11: 1 via ORAL
  Filled 2020-12-11: qty 1

## 2020-12-11 MED ORDER — AMOXICILLIN 500 MG PO CAPS: 1000.0000 mg | ORAL_CAPSULE | Freq: Two times a day (BID) | ORAL | 0 refills | Status: DC

## 2020-12-11 MED ORDER — NAPROXEN 500 MG PO TABS: 500.0000 mg | ORAL_TABLET | Freq: Two times a day (BID) | ORAL | 0 refills | Status: DC

## 2020-12-11 NOTE — Discharge Instructions (Addendum)
Take amoxicillin and naproxen twice a day.  You may add acetaminophen to get additional pain relief.  Reserve hydrocodone-acetaminophen for severe pain.  Please remember that you should not drive or operate machinery within 4 hours of taking a dose of hydrocodone.  Your blood pressure was high today.  Please have it rechecked in the next week.  If it continues to be elevated, you may need to be on medication to control it.  Inadequately controlled high blood pressure can lead to heart attacks, strokes, kidney failure.

## 2020-12-11 NOTE — ED Provider Notes (Signed)
[facility]-EMERGENCY DEPT Provider Note   CSN: [id number] Arrival date & time: 12/10/20  2317   History Chief Complaint  Patient presents with  . Dental Pain    Denise Barker is a 35 y.o. female.  The history is provided by the patient.  Dental Pain She has history of asthma and comes in complaining of pain in a right upper incisor for the last 3 days.  Pain is severe and she rates it at 10/10, although it has subsided at the moment down to 6/10.  Nothing makes it better, nothing makes it worse.  She has tried applying ice which did give slight, temporary relief.  She has not been able to get into see a dentist.  Past Medical History:  Diagnosis Date  . Anxiety   . Dementia (HCC)   . Gestational diabetes     Patient Active Problem List   Diagnosis Date Noted  . COVID-19 11/10/2019  . Obesity in pregnancy 06/01/2019  . Anxiety   . Depression affecting pregnancy   . Asthma 05/22/1995    Past Surgical History:  Procedure Laterality Date  . CESAREAN SECTION N/A 11/23/2019   Procedure: CESAREAN SECTION;  Surgeon: Reva Bores, MD;  Location: [facility] LD ORS;  Service: Obstetrics;  Laterality: N/A;  . NO PAST SURGERIES       OB History    Gravida  1   Para  1   Term  1   Preterm      AB      Living  1     SAB      IAB      Ectopic      Multiple  0   Live Births  1           Family History  Problem Relation Age of Onset  . Depression Mother   . Anxiety disorder Mother   . Hypertension Mother   . Depression Father   . Anxiety disorder Father   . Bronchitis Father     Social History   Tobacco Use  . Smoking status: Never Smoker  . Smokeless tobacco: Never Used  Vaping Use  . Vaping Use: Never used  Substance Use Topics  . Alcohol use: Not Currently    Comment: occasionally , not during pregnancy  . Drug use: Not Currently    Types: Marijuana    Comment: Last used late May    Home Medications Prior to Admission  medications   Medication Sig Start Date End Date Taking? Authorizing Provider  albuterol (VENTOLIN HFA) 108 (90 Base) MCG/ACT inhaler Inhale 1 puff into the lungs every 6 (six) hours as needed for wheezing or shortness of breath.    [provider]  sertraline (ZOLOFT) 50 MG tablet Take 1 tablet (50 mg total) by mouth daily. Patient not taking: Reported on 08/08/2020 06/01/19   Rasch, Harolyn Rutherford, NP    Allergies    Patient has no known allergies.  Review of Systems   Review of Systems  All other systems reviewed and are negative.   Physical Exam Updated Vital Signs BP (!) 160/107 (BP Location: Left Arm)   Pulse 78   Temp 98.7 F (37.1 C) (Oral)   Resp 20   Ht 5\' 10"  (1.778 m)   Wt (!) 149.7 kg   SpO2 95%   BMI 47.35 kg/m   Physical Exam Vitals and nursing note reviewed.   35 year old female, resting comfortably and in no acute  distress. Vital signs are significant for elevated blood pressure. Oxygen saturation is 95%, which is normal. Head is normocephalic and atraumatic. PERRLA, EOMI.  Teeth have numerous caries present.  Tooth #7 as a large carry which has basically eaten that way the lateral aspect of the tooth.  This tooth is tender to percussion.  There is no gingival pallor, swelling, erythema. Neck is nontender and supple without adenopathy or JVD. Back is nontender and there is no CVA tenderness. Lungs are clear without rales, wheezes, or rhonchi. Chest is nontender. Heart has regular rate and rhythm without murmur. Abdomen is soft, flat, nontender without masses or hepatosplenomegaly and peristalsis is normoactive. Extremities have no cyanosis or edema, full range of motion is present. Skin is warm and dry without rash. Neurologic: Mental status is normal, cranial nerves are intact, there are no motor or sensory deficits.  ED Results / Procedures / Treatments    Procedures Procedures   Medications Ordered in ED Medications  naproxen (NAPROSYN)  tablet 500 mg (has no administration in time range)  HYDROcodone-acetaminophen (NORCO/VICODIN) 5-325 MG per tablet 1 tablet (has no administration in time range)  amoxicillin (AMOXIL) capsule 1,000 mg (has no administration in time range)    ED Course  I have reviewed the triage vital signs and the nursing notes.  MDM Rules/Calculators/A&P Dental caries involving tooth #7.  Old records reviewed, and she has no relevant past visits.  She is discharged with prescription for naproxen and amoxicillin, told to use over-the-counter acetaminophen for additional pain relief.  Also given a prescription for a small number of hydrocodone-acetaminophen tablets.  She is advised to see a dentist as soon as possible for definitive treatment of the tooth.  Final Clinical Impression(s) / ED Diagnoses Final diagnoses:  Pain due to dental caries  Elevated blood pressure reading without diagnosis of hypertension    Rx / DC Orders ED Discharge Orders         Ordered    amoxicillin (AMOXIL) 500 MG capsule  2 times daily        12/11/20 0045    naproxen (NAPROSYN) 500 MG tablet  2 times daily        12/11/20 0045    HYDROcodone-acetaminophen (NORCO) 5-325 MG tablet  Every 4 hours PRN        12/11/20 0045           Dione Booze, MD 12/11/20 [phone]

## 2020-12-12 ENCOUNTER — Telehealth: Payer: Self-pay

## 2020-12-12 NOTE — Telephone Encounter (Signed)
Transition Care Management Unsuccessful Follow-up Telephone Call  Date of discharge and from where:  12/11/2020 from [facility]  Attempts:  1st Attempt  Reason for unsuccessful TCM follow-up call:  Missing or invalid number

## 2021-02-03 ENCOUNTER — Encounter: Payer: Self-pay | Admitting: *Deleted

## 2021-02-03 ENCOUNTER — Other Ambulatory Visit (HOSPITAL_COMMUNITY)
Admission: RE | Admit: 2021-02-03 | Discharge: 2021-02-03 | Disposition: A | Discharge status: Home / Self Care | Payer: Medicaid Other | Source: Ambulatory Visit | Attending: Family Medicine | Admitting: Family Medicine

## 2021-02-03 ENCOUNTER — Other Ambulatory Visit: Payer: Self-pay

## 2021-02-03 ENCOUNTER — Ambulatory Visit (INDEPENDENT_AMBULATORY_CARE_PROVIDER_SITE_OTHER): Payer: Medicaid Other | Admitting: *Deleted

## 2021-02-03 VITALS — BP 132/81 | HR 88 | Ht 70.0 in

## 2021-02-03 DIAGNOSIS — N898 Other specified noninflammatory disorders of vagina: Secondary | ICD-10-CM

## 2021-02-03 NOTE — Progress Notes (Signed)
Pt here today with report of vaginal d/c which is brown to light brown. This is a change from 2 months ago when the discharge was yellow. She denies itching or irritation however does report odor. Pt further states that she has had abnormal vaginal discharge all of her life. Pt was advised that she may want to schedule appointment to see a provider for this long standing problem. She stated that our providers are no longer in her network for insurance and for this reason she has a pending appt with a new gyn provider. Pt is sexually active and desires testing for STI's as well as yeast and BV. Self swab was obtained. Pt informed that she will receive results and treatment indicated if any, via Mychart. Pt voiced understanding.

## 2021-02-04 LAB — CERVICOVAGINAL ANCILLARY ONLY
Bacterial Vaginitis (gardnerella): NEGATIVE
Candida Glabrata: NEGATIVE
Candida Vaginitis: POSITIVE — AB
Chlamydia: NEGATIVE
Comment: NEGATIVE
Comment: NEGATIVE
Comment: NEGATIVE
Comment: NEGATIVE
Comment: NEGATIVE
Comment: NORMAL
Neisseria Gonorrhea: NEGATIVE
Trichomonas: NEGATIVE

## 2021-02-04 NOTE — Progress Notes (Signed)
Agree with A & P. 

## 2021-02-05 ENCOUNTER — Other Ambulatory Visit: Payer: Self-pay | Admitting: Lactation Services

## 2021-02-05 ENCOUNTER — Encounter: Payer: Self-pay | Admitting: Lactation Services

## 2021-02-05 MED ORDER — FLUCONAZOLE 150 MG PO TABS: 150.0000 mg | ORAL_TABLET | Freq: Once | ORAL | 0 refills | Status: AC

## 2021-02-18 ENCOUNTER — Encounter: Payer: Self-pay | Admitting: Internal Medicine

## 2021-02-18 ENCOUNTER — Other Ambulatory Visit: Payer: Self-pay

## 2021-02-18 ENCOUNTER — Ambulatory Visit (INDEPENDENT_AMBULATORY_CARE_PROVIDER_SITE_OTHER): Payer: Medicaid Other | Admitting: Internal Medicine

## 2021-02-18 DIAGNOSIS — F333 Major depressive disorder, recurrent, severe with psychotic symptoms: Secondary | ICD-10-CM | POA: Diagnosis not present

## 2021-02-18 DIAGNOSIS — F32A Depression, unspecified: Secondary | ICD-10-CM | POA: Insufficient documentation

## 2021-02-18 MED ORDER — BUPROPION HCL ER (XL) 150 MG PO TB24: 150.0000 mg | ORAL_TABLET | Freq: Every day | ORAL | 1 refills | Status: DC

## 2021-02-18 NOTE — Progress Notes (Signed)
New Patient Office Visit     This visit occurred during the SARS-CoV-2 public health emergency.  Safety protocols were in place, including screening questions prior to the visit, additional usage of staff PPE, and extensive cleaning of exam room while observing appropriate contact time as indicated for disinfecting solutions.    CC/Reason for Visit: Establish care, discuss depression Previous PCP: None Last Visit: Unknown  HPI: Denise Barker is a 35 y.o. female who is coming in today for the above mentioned reasons. Past Medical History is significant for: Morbid obesity, asthma that is been well controlled on as needed albuterol, severe depression.  She moved to the area from [place] about 2 years ago.  She has a 47-month-old stone.  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  She has no known drug allergies.  Her past surgical history is only significant for a C-section.  Her family history significant for a father and paternal grandfather with diabetes, multiple mental health issues in her family as well.  She used to be a smoker of about a pack a week but quit in 2015, she does smoke cannabis daily.  She is here mainly today to establish care but also to discuss her depression.  She has had depression since her youth.  She tells me she was hospitalized around 2012 with few times for suicide attempts.  She is consumed with paranoid thoughts.  She believes that people are going to come into her house and murder her, she also has recurring thoughts about family members dying.  While pregnant she was on Zoloft but she quit it due to excessive sedation.  She has also been on Celexa in the past and feels like it significantly affected her libido and sexual function.  She thinks she might have been diagnosed with hypothyroidism in the past.  She denies suicidal ideation or intent.   Past Medical/Surgical History: Past Medical History:  Diagnosis Date  . Anxiety   . Asthma   . Depression   . Gestational  diabetes   . Morbid obesity (HCC)     Past Surgical History:  Procedure Laterality Date  . CESAREAN SECTION N/A 11/23/2019   Procedure: CESAREAN SECTION;  Surgeon: Reva Bores, MD;  Location: [facility];  Service: Obstetrics;  Laterality: N/A;  . NO PAST SURGERIES      Social History:  reports that she has never smoked. She has never used smokeless tobacco. She reports previous alcohol use. She reports current drug use. Drug: Marijuana.  Allergies: No Known Allergies  Family History:  Family History  Problem Relation Age of Onset  . Depression Mother   . Anxiety disorder Mother   . Hypertension Mother   . Depression Father   . Anxiety disorder Father   . Bronchitis Father      Current Outpatient Medications:  .  albuterol (VENTOLIN HFA) 108 (90 Base) MCG/ACT inhaler, Inhale 1 puff into the lungs every 6 (six) hours as needed for wheezing or shortness of breath., Disp: , Rfl:  .  buPROPion (WELLBUTRIN XL) 150 MG 24 hr tablet, Take 1 tablet (150 mg total) by mouth daily., Disp: 90 tablet, Rfl: 1 .  OVER THE COUNTER MEDICATION, Prematine mist - once weekly, Disp: , Rfl:   Review of Systems:  Constitutional: Denies fever, chills, diaphoresis, appetite change and fatigue.  HEENT: Denies photophobia, eye pain, redness, hearing loss, ear pain, congestion, sore throat, rhinorrhea, sneezing, mouth sores, trouble swallowing, neck pain, neck stiffness and tinnitus.  Respiratory: Denies SOB, DOE, cough, chest tightness,  and wheezing.   Cardiovascular: Denies chest pain, palpitations and leg swelling.  Gastrointestinal: Denies nausea, vomiting, abdominal pain, diarrhea, constipation, blood in stool and abdominal distention.  Genitourinary: Denies dysuria, urgency, frequency, hematuria, flank pain and difficulty urinating.  Endocrine: Denies: hot or cold intolerance, sweats, changes in hair or nails, polyuria, polydipsia. Musculoskeletal: Denies myalgias, back pain, joint swelling,  arthralgias and gait problem.  Skin: Denies pallor, rash and wound.  Neurological: Denies dizziness, seizures, syncope, weakness, light-headedness, numbness and headaches.  Hematological: Denies adenopathy. Easy bruising, personal or family bleeding history  Psychiatric/Behavioral: Denies suicidal ideation,  Confusion.    Physical Exam: Vitals:   02/18/21 1424  BP: 110/80  Pulse: 100  Temp: 98.2 F (36.8 C)  TempSrc: Oral  SpO2: 98%  Weight: (!) 312 lb 3.2 oz (141.6 kg)  Height: 5\' 11"  (1.803 m)   Body mass index is 43.54 kg/m.  Constitutional: NAD, calm, comfortable, obese Eyes: PERRL, lids and conjunctivae normal ENMT: Mucous membranes are moist.  Respiratory: clear to auscultation bilaterally, no wheezing, no crackles. Normal respiratory effort. No accessory muscle use.  Cardiovascular: Regular rate and rhythm, no murmurs / rubs / gallops. No extremity edema.  Neurologic: Grossly intact and nonfocal Psychiatric: Normal judgment and insight. Alert and oriented x 3.  Mood is depressed and tearful   Impression and Plan:  Severe episode of recurrent major depressive disorder, with psychotic features (HCC)  -Check labs today including TSH, vitamin B12, vitamin D, CBC, CMP. -Start Wellbutrin 150 mg daily. -Given her paranoid, obsessive thoughts I believe it is imperative that she see psychiatry and I will place referral today. -I believe she would also benefit immensely from CBT sessions, information has been given to her on how to schedule. -I will have her follow-up with me in 8 weeks. -She has been given information on how to contact the suicide crisis helpline, she is currently not suicidal.  Morbid obesity (HCC) -Discussed healthy lifestyle, including increased physical activity and better food choices to promote weight loss.  Time spent: 46 minutes obtaining history, reviewing chart, determining next plan of care    Patient Instructions   -Nice seeing you  today!!  -Lab work today; will notify you once results are available.  -Start Wellbutrin 150 mg daily.  -Make sure you schedule counseling sessions. Referral to psychiatry has been placed.  -Schedule follow up in 8 weeks.   Major Depressive Disorder, Adult Major depressive disorder is a mental health condition. This disorder affects feelings. It can also affect the body. Symptoms of this condition last most of the day, almost every day, for 2 weeks. This disorder can affect:  Relationships.  Daily activities, such as work and school.  Activities that you normally like to do. What are the causes? The cause of this condition is not known. The disorder is likely caused by a mix of things, including:  Your personality, such as being a shy person.  Your behavior, or how you act toward others.  Your thoughts and feelings.  Too much alcohol or drugs.  How you react to stress.  Health and mental problems that you have had for a long time.  Things that hurt you in the past (trauma).  Big changes in your life, such as divorce. What increases the risk? The following factors may make you more likely to develop this condition:  Having family members with depression.  Being a woman.  Problems in the family.  Low levels  of some brain chemicals.  Things that caused you pain as a child, especially if you lost a parent or were abused.  A lot of stress in your life, such as from: ? Living without basic needs of life, such as food and shelter. ? Being treated poorly because of race, sex, or religion (discrimination).  Health and mental problems that you have had for a long time. What are the signs or symptoms? The main symptoms of this condition are:  Being sad all the time.  Being grouchy all the time.  Loss of interest in things and activities. Other symptoms include:  Sleeping too much or too little.  Eating too much or too little.  Gaining or losing weight,  without knowing why.  Feeling tired or having low energy.  Being restless and weak.  Feeling hopeless, worthless, or guilty.  Trouble thinking clearly or making decisions.  Thoughts of hurting yourself or others, or thoughts of ending your life.  Spending a lot of time alone.  Inability to complete common tasks of daily life. If you have very bad MDD, you may:  Believe things that are not true.  Hear, see, taste, or feel things that are not there.  Have mild depression that lasts for at least 2 years.  Feel very sad and hopeless.  Have trouble speaking or moving. How is this treated? This condition may be treated with:  Talk therapy. This teaches you to know bad thoughts, feelings, and actions and how to change them. ? This can also help you to communicate with others. ? This can be done with members of your family.  Medicines. These can be used to treat worry (anxiety), depression, or low levels of chemicals in the brain.  Lifestyle changes. You may need to: ? Limit alcohol use. ? Limit drug use. ? Get regular exercise. ? Get plenty of sleep. ? Make healthy eating choices. ? Spend more time outdoors.  Brain stimulation. This treatment excites the brain. This is done when symptoms are very bad or have not gotten better with other treatments. Follow these instructions at home: Activity  Get regular exercise as told.  Spend time outdoors as told.  Make time to do the things you enjoy.  Find ways to deal with stress. Try to: ? Meditate. ? Do deep breathing. ? Spend time in nature. ? Keep a journal.  Return to your normal activities as told by your doctor. Ask your doctor what activities are safe for you. Alcohol and drug use  If you drink alcohol: ? Limit how much you use to:  0-1 drink a day for women.  0-2 drinks a day for men. ? Be aware of how much alcohol is in your drink. In the [place] one drink equals one 12 oz bottle of beer (355 mL), one 5 oz  glass of wine (148 mL), or one 1 oz glass of hard liquor (44 mL).  Talk to your doctor about: ? Alcohol use. Alcohol can affect some medicines. ? Any drug use. General instructions  Take over-the-counter and prescription medicines and herbal preparations only as told by your doctor.  Eat a healthy diet.  Get a lot of sleep.  Think about joining a support group. Your doctor may be able to suggest one.  Keep all follow-up visits as told by your doctor. This is important.   Where to find more information:  The [employer] on Mental Illness: www.nami.org  [place] [employer] of Mental Health: http://www.maynard.net/  American Psychiatric  Association: www.psychiatry.org/patients-families/ Contact a doctor if:  Your symptoms get worse.  You get new symptoms. Get help right away if:  You hurt yourself.  You have serious thoughts about hurting yourself or others.  You see, hear, taste, smell, or feel things that are not there. If you ever feel like you may hurt yourself or others, or have thoughts about taking your own life, get help right away. Go to your nearest emergency department or:  Call your local emergency services (911 in the [place]  Call a suicide crisis helpline, such as the National Suicide Prevention Lifeline at 707-298-72881-603-031-1909. This is open 24 hours a day in the [place]  Text the Crisis Text Line at 872-339-4600741741 (in the [place] Summary  Major depressive disorder is a mental health condition. This disorder affects feelings. Symptoms of this condition last most of the day, almost every day, for 2 weeks.  The symptoms of this disorder can cause problems with relationships and with daily activities.  There are treatments and support for people who get this disorder. You may need more than one type of treatment.  Get help right away if you have serious thoughts about hurting yourself or others. This information is not intended to replace advice given to you by your health  care provider. Make sure you discuss any questions you have with your health care provider. Document Revised: 09/16/2019 Document Reviewed: 09/16/2019 Elsevier Patient Education  2021 Elsevier Inc.      Chaya JanEstela Hernandez Acosta, MD [facility]

## 2021-02-18 NOTE — Addendum Note (Signed)
Addended by: Leonette Nutting on: 02/18/2021 03:48 PM   Modules accepted: Orders

## 2021-02-18 NOTE — Patient Instructions (Signed)
-Nice seeing you today!!  -Lab work today; will notify you once results are available.  -Start Wellbutrin 150 mg daily.  -Make sure you schedule counseling sessions. Referral to psychiatry has been placed.  -Schedule follow up in 8 weeks.   Major Depressive Disorder, Adult Major depressive disorder is a mental health condition. This disorder affects feelings. It can also affect the body. Symptoms of this condition last most of the day, almost every day, for 2 weeks. This disorder can affect:  Relationships.  Daily activities, such as work and school.  Activities that you normally like to do. What are the causes? The cause of this condition is not known. The disorder is likely caused by a mix of things, including:  Your personality, such as being a shy person.  Your behavior, or how you act toward others.  Your thoughts and feelings.  Too much alcohol or drugs.  How you react to stress.  Health and mental problems that you have had for a long time.  Things that hurt you in the past (trauma).  Big changes in your life, such as divorce. What increases the risk? The following factors may make you more likely to develop this condition:  Having family members with depression.  Being a woman.  Problems in the family.  Low levels of some brain chemicals.  Things that caused you pain as a child, especially if you lost a parent or were abused.  A lot of stress in your life, such as from: ? Living without basic needs of life, such as food and shelter. ? Being treated poorly because of race, sex, or religion (discrimination).  Health and mental problems that you have had for a long time. What are the signs or symptoms? The main symptoms of this condition are:  Being sad all the time.  Being grouchy all the time.  Loss of interest in things and activities. Other symptoms include:  Sleeping too much or too little.  Eating too much or too little.  Gaining or  losing weight, without knowing why.  Feeling tired or having low energy.  Being restless and weak.  Feeling hopeless, worthless, or guilty.  Trouble thinking clearly or making decisions.  Thoughts of hurting yourself or others, or thoughts of ending your life.  Spending a lot of time alone.  Inability to complete common tasks of daily life. If you have very bad MDD, you may:  Believe things that are not true.  Hear, see, taste, or feel things that are not there.  Have mild depression that lasts for at least 2 years.  Feel very sad and hopeless.  Have trouble speaking or moving. How is this treated? This condition may be treated with:  Talk therapy. This teaches you to know bad thoughts, feelings, and actions and how to change them. ? This can also help you to communicate with others. ? This can be done with members of your family.  Medicines. These can be used to treat worry (anxiety), depression, or low levels of chemicals in the brain.  Lifestyle changes. You may need to: ? Limit alcohol use. ? Limit drug use. ? Get regular exercise. ? Get plenty of sleep. ? Make healthy eating choices. ? Spend more time outdoors.  Brain stimulation. This treatment excites the brain. This is done when symptoms are very bad or have not gotten better with other treatments. Follow these instructions at home: Activity  Get regular exercise as told.  Spend time outdoors as told.  Make time to do the things you enjoy.  Find ways to deal with stress. Try to: ? Meditate. ? Do deep breathing. ? Spend time in nature. ? Keep a journal.  Return to your normal activities as told by your doctor. Ask your doctor what activities are safe for you. Alcohol and drug use  If you drink alcohol: ? Limit how much you use to:  0-1 drink a day for women.  0-2 drinks a day for men. ? Be aware of how much alcohol is in your drink. In the [place] one drink equals one 12 oz bottle of beer (355  mL), one 5 oz glass of wine (148 mL), or one 1 oz glass of hard liquor (44 mL).  Talk to your doctor about: ? Alcohol use. Alcohol can affect some medicines. ? Any drug use. General instructions  Take over-the-counter and prescription medicines and herbal preparations only as told by your doctor.  Eat a healthy diet.  Get a lot of sleep.  Think about joining a support group. Your doctor may be able to suggest one.  Keep all follow-up visits as told by your doctor. This is important.   Where to find more information:  The [employer] on Mental Illness: www.nami.org  [place] [employer] of Mental Health: http://www.maynard.net/  American Psychiatric Association: www.psychiatry.org/patients-families/ Contact a doctor if:  Your symptoms get worse.  You get new symptoms. Get help right away if:  You hurt yourself.  You have serious thoughts about hurting yourself or others.  You see, hear, taste, smell, or feel things that are not there. If you ever feel like you may hurt yourself or others, or have thoughts about taking your own life, get help right away. Go to your nearest emergency department or:  Call your local emergency services (911 in the [place]  Call a suicide crisis helpline, such as the National Suicide Prevention Lifeline at 727 153 6013. This is open 24 hours a day in the [place]  Text the Crisis Text Line at (939)108-8623 (in the [place] Summary  Major depressive disorder is a mental health condition. This disorder affects feelings. Symptoms of this condition last most of the day, almost every day, for 2 weeks.  The symptoms of this disorder can cause problems with relationships and with daily activities.  There are treatments and support for people who get this disorder. You may need more than one type of treatment.  Get help right away if you have serious thoughts about hurting yourself or others. This information is not intended to replace advice given to you by  your health care provider. Make sure you discuss any questions you have with your health care provider. Document Revised: 09/16/2019 Document Reviewed: 09/16/2019 Elsevier Patient Education  2021 [employer].

## 2021-02-21 ENCOUNTER — Other Ambulatory Visit (INDEPENDENT_AMBULATORY_CARE_PROVIDER_SITE_OTHER): Payer: Medicaid Other

## 2021-02-21 ENCOUNTER — Other Ambulatory Visit: Payer: Self-pay

## 2021-02-21 DIAGNOSIS — F333 Major depressive disorder, recurrent, severe with psychotic symptoms: Secondary | ICD-10-CM | POA: Diagnosis not present

## 2021-02-21 LAB — CBC WITH DIFFERENTIAL/PLATELET
Basophils Absolute: 0 10*3/uL (ref 0.0–0.1)
Basophils Relative: 0.6 % (ref 0.0–3.0)
Eosinophils Absolute: 0.2 10*3/uL (ref 0.0–0.7)
Eosinophils Relative: 3.4 % (ref 0.0–5.0)
HCT: 39.9 % (ref 36.0–46.0)
Hemoglobin: 13.7 g/dL (ref 12.0–15.0)
Lymphocytes Relative: 37.8 % (ref 12.0–46.0)
Lymphs Abs: 2.3 10*3/uL (ref 0.7–4.0)
MCHC: 34.3 g/dL (ref 30.0–36.0)
MCV: 88.9 fl (ref 78.0–100.0)
Monocytes Absolute: 0.5 10*3/uL (ref 0.1–1.0)
Monocytes Relative: 8.1 % (ref 3.0–12.0)
Neutro Abs: 3.1 10*3/uL (ref 1.4–7.7)
Neutrophils Relative %: 50.1 % (ref 43.0–77.0)
Platelets: 427 10*3/uL — ABNORMAL HIGH (ref 150.0–400.0)
RBC: 4.49 Mil/uL (ref 3.87–5.11)
RDW: 13.4 % (ref 11.5–15.5)
WBC: 6.2 10*3/uL (ref 4.0–10.5)

## 2021-02-21 LAB — COMPREHENSIVE METABOLIC PANEL WITH GFR
ALT: 13 U/L (ref 0–35)
AST: 11 U/L (ref 0–37)
Albumin: 4 g/dL (ref 3.5–5.2)
Alkaline Phosphatase: 82 U/L (ref 39–117)
BUN: 9 mg/dL (ref 6–23)
CO2: 23 meq/L (ref 19–32)
Calcium: 9.3 mg/dL (ref 8.4–10.5)
Chloride: 108 meq/L (ref 96–112)
Creatinine, Ser: 0.78 mg/dL (ref 0.40–1.20)
GFR: 100.33 mL/min (ref >60.00)
Glucose, Bld: 110 mg/dL — ABNORMAL HIGH (ref 70–99)
Potassium: 4.2 meq/L (ref 3.5–5.1)
Sodium: 141 meq/L (ref 135–145)
Total Bilirubin: 0.6 mg/dL (ref 0.2–1.2)
Total Protein: 6.9 g/dL (ref 6.0–8.3)

## 2021-02-21 LAB — VITAMIN B12: Vitamin B-12: 150 pg/mL — ABNORMAL LOW (ref 211–911)

## 2021-02-21 LAB — TSH: TSH: 2.22 u[IU]/mL (ref 0.35–4.50)

## 2021-02-21 LAB — VITAMIN D 25 HYDROXY (VIT D DEFICIENCY, FRACTURES): VITD: 11.76 ng/mL — ABNORMAL LOW (ref 30.00–100.00)

## 2021-02-25 ENCOUNTER — Telehealth: Payer: Self-pay | Admitting: Internal Medicine

## 2021-02-25 NOTE — Telephone Encounter (Signed)
Pt is calling back to get her lab results and is aware that the provider has not received them and once she does someone will give her a call to give her the results and recommendation from the provider.

## 2021-02-25 NOTE — Telephone Encounter (Signed)
Patient is returning the call for lab results, please advise. CB is [phone]

## 2021-02-27 ENCOUNTER — Other Ambulatory Visit: Payer: Self-pay | Admitting: Internal Medicine

## 2021-02-27 ENCOUNTER — Encounter: Payer: Self-pay | Admitting: Internal Medicine

## 2021-02-27 DIAGNOSIS — E559 Vitamin D deficiency, unspecified: Secondary | ICD-10-CM | POA: Insufficient documentation

## 2021-02-27 DIAGNOSIS — E538 Deficiency of other specified B group vitamins: Secondary | ICD-10-CM | POA: Insufficient documentation

## 2021-02-27 MED ORDER — VITAMIN D (ERGOCALCIFEROL) 1.25 MG (50000 UNIT) PO CAPS: 50000.0000 [IU] | ORAL_CAPSULE | ORAL | 0 refills | Status: AC

## 2021-02-27 NOTE — Telephone Encounter (Signed)
Patient is aware 

## 2021-03-04 ENCOUNTER — Ambulatory Visit (INDEPENDENT_AMBULATORY_CARE_PROVIDER_SITE_OTHER): Payer: Medicaid Other | Admitting: *Deleted

## 2021-03-04 ENCOUNTER — Other Ambulatory Visit: Payer: Self-pay

## 2021-03-04 DIAGNOSIS — E538 Deficiency of other specified B group vitamins: Secondary | ICD-10-CM | POA: Diagnosis not present

## 2021-03-04 MED ORDER — CYANOCOBALAMIN 1000 MCG/ML IJ SOLN
1000.0000 ug | Freq: Once | INTRAMUSCULAR | Status: AC
  Administered 2021-03-04: 1000 ug via INTRAMUSCULAR

## 2021-03-04 NOTE — Progress Notes (Signed)
Per orders of Dr. Hernandez, injection of Cyanocobalamin 1000mcg given by Saoirse Legere A. Patient tolerated injection well. 

## 2021-03-11 ENCOUNTER — Ambulatory Visit (INDEPENDENT_AMBULATORY_CARE_PROVIDER_SITE_OTHER): Payer: Medicaid Other

## 2021-03-11 ENCOUNTER — Other Ambulatory Visit: Payer: Self-pay

## 2021-03-11 DIAGNOSIS — E538 Deficiency of other specified B group vitamins: Secondary | ICD-10-CM | POA: Diagnosis not present

## 2021-03-11 MED ORDER — CYANOCOBALAMIN 1000 MCG/ML IJ SOLN
1000.0000 ug | Freq: Once | INTRAMUSCULAR | Status: AC
  Administered 2021-03-11: 1000 ug via INTRAMUSCULAR

## 2021-03-11 NOTE — Progress Notes (Addendum)
Per orders from Dr Ardyth Harps, patient given B12 injection by Steffanie Rainwater, tolerated well.

## 2021-03-18 ENCOUNTER — Other Ambulatory Visit: Payer: Self-pay

## 2021-03-18 ENCOUNTER — Ambulatory Visit (INDEPENDENT_AMBULATORY_CARE_PROVIDER_SITE_OTHER): Payer: Medicaid Other | Admitting: *Deleted

## 2021-03-18 DIAGNOSIS — E538 Deficiency of other specified B group vitamins: Secondary | ICD-10-CM

## 2021-03-18 MED ORDER — CYANOCOBALAMIN 1000 MCG/ML IJ SOLN
1000.0000 ug | Freq: Once | INTRAMUSCULAR | Status: AC
  Administered 2021-03-18: 1000 ug via INTRAMUSCULAR

## 2021-03-18 NOTE — Progress Notes (Signed)
Per orders of Dr. Hernandez, injection of Cyanocobalamin 1000mcg given by Jaisen Wiltrout A. Patient tolerated injection well. 

## 2021-03-21 ENCOUNTER — Other Ambulatory Visit: Payer: Self-pay | Admitting: Internal Medicine

## 2021-03-21 DIAGNOSIS — R109 Unspecified abdominal pain: Secondary | ICD-10-CM

## 2021-03-25 ENCOUNTER — Ambulatory Visit (INDEPENDENT_AMBULATORY_CARE_PROVIDER_SITE_OTHER): Payer: Medicaid Other

## 2021-03-25 ENCOUNTER — Other Ambulatory Visit: Payer: Self-pay

## 2021-03-25 DIAGNOSIS — E538 Deficiency of other specified B group vitamins: Secondary | ICD-10-CM | POA: Diagnosis not present

## 2021-03-25 MED ORDER — CYANOCOBALAMIN 1000 MCG/ML IJ SOLN
1000.0000 ug | Freq: Once | INTRAMUSCULAR | Status: AC
  Administered 2021-03-25: 1000 ug via INTRAMUSCULAR

## 2021-03-25 NOTE — Progress Notes (Signed)
Per orders of Dr. Hernandez, injection of Cyanocobalamin 1000 mcg given by Lashannon Bresnan L Quavis Klutz. Patient tolerated injection well.  

## 2021-04-01 ENCOUNTER — Ambulatory Visit: Payer: Medicaid Other

## 2021-04-15 ENCOUNTER — Telehealth (INDEPENDENT_AMBULATORY_CARE_PROVIDER_SITE_OTHER): Payer: Medicaid Other | Admitting: Psychiatry

## 2021-04-15 ENCOUNTER — Other Ambulatory Visit: Payer: Self-pay

## 2021-04-15 ENCOUNTER — Encounter (HOSPITAL_COMMUNITY): Payer: Self-pay | Admitting: Psychiatry

## 2021-04-15 ENCOUNTER — Ambulatory Visit: Payer: Medicaid Other | Admitting: Internal Medicine

## 2021-04-15 DIAGNOSIS — F411 Generalized anxiety disorder: Secondary | ICD-10-CM | POA: Diagnosis not present

## 2021-04-15 DIAGNOSIS — F1994 Other psychoactive substance use, unspecified with psychoactive substance-induced mood disorder: Secondary | ICD-10-CM | POA: Diagnosis not present

## 2021-04-15 MED ORDER — HYDROXYZINE HCL 10 MG PO TABS: 10.0000 mg | ORAL_TABLET | Freq: Three times a day (TID) | ORAL | 2 refills | Status: DC | PRN

## 2021-04-15 MED ORDER — BUPROPION HCL ER (XL) 300 MG PO TB24: 300.0000 mg | ORAL_TABLET | Freq: Every day | ORAL | 2 refills | Status: DC

## 2021-04-15 MED ORDER — BUPROPION HCL ER (XL) 150 MG PO TB24: 150.0000 mg | ORAL_TABLET | Freq: Every day | ORAL | 1 refills | Status: DC

## 2021-04-15 MED ORDER — ARIPIPRAZOLE 5 MG PO TABS: 5.0000 mg | ORAL_TABLET | Freq: Every day | ORAL | 2 refills | Status: DC

## 2021-04-15 NOTE — Progress Notes (Signed)
Psychiatric Initial Adult Assessment  Virtual Visit via Video Note  I connected with Green[place] Barker on 04/15/21 at  2:00 PM EDT by a video enabled telemedicine application and verified that I am speaking with the correct person using two identifiers.  Location: Patient: Home Provider: Clinic   I discussed the limitations of evaluation and management by telemedicine and the availability of in person appointments. The patient expressed understanding and agreed to proceed.  I provided 45 minutes of non-face-to-face time during this encounter.   Patient Identification: Denise Barker MRN:  [id number] Date of Evaluation:  04/15/2021 Referral Source: Denise Pitt MD.  Chief Complaint:  "Over the last few weeks I have been short, depressed and having suicidal thoughts" Visit Diagnosis:    ICD-10-CM   1. Substance induced mood disorder (HCC)  F19.94 ARIPiprazole (ABILIFY) 5 MG tablet    buPROPion (WELLBUTRIN XL) 300 MG 24 hr tablet    DISCONTINUED: buPROPion (WELLBUTRIN XL) 150 MG 24 hr tablet    2. Generalized anxiety disorder  F41.1 hydrOXYzine (ATARAX/VISTARIL) 10 MG tablet      History of Present Illness: 35 year old female seen today for initial psychiatric evaluation.  She was referred to outpatient psychiatry by her PCP for medication management.  She has a psychiatric history of anxiety and depression.  Currently she is managed on Wellbutrin XL 150 mg daily.  She notes her medication is somewhat effective in managing her psychiatric condition.  Today she is well-groomed, pleasant, cooperative, engaged in conversation.  She informed provider that she has been short with people, depressed, and having passive SI.  She notes that generally her suicidal ideation started after being on antidepressants for a few weeks.  She notes that generally when her dose is increased her depression improved and her SI goes away.  Today she notes that she does not want to harm her self.  She notes that she would  like to live for her 30-month-year-old son and her boyfriend.  Patient does endorse mild anxiety and depression.  Today provider conducted a GAD-7 and patient scored an 11.  Patient notes that she notes that her anxiety is abnormal.  She is however notes that her body is wired to freak out. Provider also conducted a PHQ-9 and patient scored a 12.  She notes that her appetite has decreased nut notes that her weight has maintained.  She notes that her sleep is poor. Noting that she sleeps 4-5 hours nightly. Patient notes to cope with her anxiety she smokes marijuana daily.  Writer informed patient that marijuana use can worsen her mental health conditions.  She endorsed understanding however notes that she feels that it is helpful in controlling her anxiety. Today she endorses delusions noting that at times she believes that someone who is deceased is alive, fluctuations in her mood, racing thoughts, and irritability.  She also endorses having visual hallucinations noting that she sees dark shadows or feels that there is a bug near her.  Patient notes that she is paranoid most days.  She informed Clinical research associate that at times she believes that her son, boyfriend, or herself will be killed. She also notes that at times she feels like she will be raped or that her son will be abducted.  Patient notes that some of her problems may be related to the passing of her father to a heart attack.  She also notes that she has had several classmates passed away due to opioid consumption.  She notes that she has been having abdominal  pain for years.  She notes that she becomes nauseous.  At times she notes it affects her appetite.  She notes that if she overeats she vomits.  She notes that in the past she was diagnosed with a hernia however notes that there has not been a recent work-up.  She informed provider that she will discuss the symptoms with her PCP in the near future.  Today she is agreeable to starting Abilify 5 mg to help  manage symptoms of psychosis and paranoia.  She will increase Wellbutrin 150 mg to 300 mg to help manage depression.  She will also start hydroxyzine 10 mg 3 times daily to help manage anxiety. Potential side effects of medication and risks vs benefits of treatment vs non-treatment were explained and discussed. All questions were answered.  She will follow-up with outpatient therapy for counseling.  No other concerns at this time.     Associated Signs/Symptoms: Depression Symptoms:  depressed mood, insomnia, psychomotor agitation, fatigue, feelings of worthlessness/guilt, difficulty concentrating, hopelessness, impaired memory, suicidal thoughts without plan, anxiety, loss of energy/fatigue, weight gain, increased appetite, (Hypo) Manic Symptoms:  Delusions, Elevated Mood, Flight of Ideas, Irritable Mood, Anxiety Symptoms:  Excessive Worry, Panic Symptoms, Psychotic Symptoms:  Delusions, Hallucinations: Auditory Visual  Paranoia, PTSD Symptoms: Had a traumatic exposure:    Patient notes that her father died suddenly of a heart attack.  She notes that this was traumatic because to close  Past Psychiatric History: Depression and anxiety  Previous Psychotropic Medications:  Zoloft, Wellbutrin, and SI  Substance Abuse History in the last 12 months:  Yes.    Consequences of Substance Abuse: NA  Past Medical History:  Past Medical History:  Diagnosis Date   Anxiety    Asthma    Depression    Gestational diabetes    Morbid obesity (HCC)     Past Surgical History:  Procedure Laterality Date   CESAREAN SECTION N/A 11/23/2019   Procedure: CESAREAN SECTION;  Surgeon: Denise Bores, MD;  Location: [facility];  Service: Obstetrics;  Laterality: N/A;   NO PAST SURGERIES      Family Psychiatric History: Mother depression and bipolar disorder. Paternal grandmother anxiety. Paternal 3 cousins committed suicide and one paternal aunt.   Family History:  Family History   Problem Relation Age of Onset   Depression Mother    Anxiety disorder Mother    Hypertension Mother    Depression Father    Anxiety disorder Father    Bronchitis Father     Social History:   Social History   Socioeconomic History   Marital status: Single    Spouse name: Not on file   Number of children: Not on file   Years of education: Not on file   Highest education level: Not on file  Occupational History   Not on file  Tobacco Use   Smoking status: Never   Smokeless tobacco: Never  Vaping Use   Vaping Use: Never used  Substance and Sexual Activity   Alcohol use: Not Currently    Comment: occasionally , not during pregnancy   Drug use: Yes    Types: Marijuana    Comment: Last used late May   Sexual activity: Yes    Birth control/protection: None  Other Topics Concern   Not on file  Social History Narrative   Not on file   Social Determinants of Health   Financial Resource Strain: Not on file  Food Insecurity: Food Insecurity Present   Worried About  Running Out of Food in the Last Year: Often true   Ran Out of Food in the Last Year: Sometimes true  Transportation Needs: No Transportation Needs   Lack of Transportation (Medical): No   Lack of Transportation (Non-Medical): No  Physical Activity: Not on file  Stress: Not on file  Social Connections: Not on file    Additional Social History: Patient [place] in [place] with her boyfriend 5916 month year old son. She [place] at [employer]. She endorses smoking marijuana daily. She denies tobacco or alcohol use.   Allergies:  No Known Allergies  Metabolic Disorder Labs: Lab Results  Component Value Date   HGBA1C CANCELED 06/01/2019   No results found for: PROLACTIN No results found for: CHOL, TRIG, HDL, CHOLHDL, VLDL, LDLCALC Lab Results  Component Value Date   TSH 2.22 02/21/2021    Therapeutic Level Labs: No results found for: LITHIUM No results found for: CBMZ No results found for:  VALPROATE  Current Medications: Current Outpatient Medications  Medication Sig Dispense Refill   ARIPiprazole (ABILIFY) 5 MG tablet Take 1 tablet (5 mg total) by mouth daily. 30 tablet 2   hydrOXYzine (ATARAX/VISTARIL) 10 MG tablet Take 1 tablet (10 mg total) by mouth 3 (three) times daily as needed. 90 tablet 2   albuterol (VENTOLIN HFA) 108 (90 Base) MCG/ACT inhaler Inhale 1 puff into the lungs every 6 (six) hours as needed for wheezing or shortness of breath.     buPROPion (WELLBUTRIN XL) 300 MG 24 hr tablet Take 1 tablet (300 mg total) by mouth daily. 30 tablet 2   OVER THE COUNTER MEDICATION Prematine mist - once weekly     Vitamin D, Ergocalciferol, (DRISDOL) 1.25 MG (50000 UNIT) CAPS capsule Take 1 capsule (50,000 Units total) by mouth every 7 (seven) days for 12 doses. 12 capsule 0   No current facility-administered medications for this visit.    Musculoskeletal: Strength & Muscle Tone:  Unable to assess due to telehealh visit Gait & Station:  Unable to assess due to telehealh visit Patient leans: N/A  Psychiatric Specialty Exam: Review of Systems  not currently breastfeeding.There is no height or weight on file to calculate BMI.  General Appearance: Well Groomed  Eye Contact:  Good  Speech:  Clear and Coherent and Normal Rate  Volume:  Normal  Mood:  Anxious and Depressed  Affect:  Appropriate and Congruent  Thought Process:  Coherent, Goal Directed, and Linear  Orientation:  Full (Time, Place, and Person)  Thought Content:  WDL and Logical  Suicidal Thoughts:  Yes.  without intent/plan  Homicidal Thoughts:  No  Memory:  Immediate;   Good Recent;   Good Remote;   Good  Judgement:  Fair  Insight:  Fair  Psychomotor Activity:  Normal  Concentration:  Concentration: Good and Attention Span: Good  Recall:  Good  Fund of Knowledge:Good  Language: Good  Akathisia:  No  Handed:  Right  AIMS (if indicated):  not done  Assets:  Communication Skills Desire for  Improvement Financial Resources/Insurance Housing Physical Health Social Support Vocational/Educational  ADL's:  Intact  Cognition: WNL  Sleep:  Fair   Screenings: GAD-7    Flowsheet Row [facility] from [facility]08/2020 [facility] Video [facility] from 07/12/2019  in Center for [facility]  Total GAD-7 Score 11 7 9  0 19      PHQ2-9    Flowsheet Row Video Visit from 04/15/2021 in [facility] Office Visit from 02/18/2021 in [facility] Office Visit from 11/07/2020 in Center for 11/09/2020 at [facility] Office Visit from 08/08/2020 in Center for 08/10/2020 Healthcare at [facility] Video Visit from 11/13/2019 in Center for [facility]  PHQ-2 Total Score 2 2 4 2  0  PHQ-9 Total Score 12 8 14 10 6       Flowsheet Row Video Visit from 04/15/2021 in [facility] ED from 12/10/2020 in [facility] from 07/12/2019 in Center for [facility]  C-SSRS RISK CATEGORY Error: Q7 should not be populated when Q6 is No No Risk Low Risk       Assessment and Plan: Today patient endorses symptoms of anxiety, depression, AH, paranoia, and marijuana use. Today she is agreeable to starting Abilify 5 mg to help manage symptoms of psychosis and paranoia.  She will increase Wellbutrin 150 mg to 300 mg to help manage depression.  She will also start hydroxyzine 10 mg 3 times daily to help manage anxiety.   1. Substance induced mood disorder (HCC)  Start- ARIPiprazole (ABILIFY) 5 MG tablet;  Take 1 tablet (5 mg total) by mouth daily.  Dispense: 30 tablet; Refill: 2 Start-- buPROPion (WELLBUTRIN XL) 300 MG 24 hr tablet; Take 1 tablet (300 mg total) by mouth daily.  Dispense: 30 tablet; Refill: 2  2. Generalized anxiety disorder  Start-- hydrOXYzine (ATARAX/VISTARIL) 10 MG tablet; Take 1 tablet (10 mg total) by mouth 3 (three) times daily as needed.  Dispense: 90 tablet; Refill: 2  Follow up in 3 months Follow up with therapy  [place], NP 6/28/20222:54 PM

## 2021-04-23 ENCOUNTER — Ambulatory Visit (INDEPENDENT_AMBULATORY_CARE_PROVIDER_SITE_OTHER): Payer: Medicaid Other | Admitting: *Deleted

## 2021-04-23 ENCOUNTER — Other Ambulatory Visit: Payer: Self-pay

## 2021-04-23 DIAGNOSIS — E538 Deficiency of other specified B group vitamins: Secondary | ICD-10-CM | POA: Diagnosis not present

## 2021-04-23 MED ORDER — CYANOCOBALAMIN 1000 MCG/ML IJ SOLN
1000.0000 ug | Freq: Once | INTRAMUSCULAR | Status: AC
  Administered 2021-04-23: 1000 ug via INTRAMUSCULAR

## 2021-04-23 NOTE — Progress Notes (Signed)
Per orders of Cory Nafziger NP, injection of B12 given by Makenley Shimp. Patient tolerated injection well. 

## 2021-04-24 ENCOUNTER — Ambulatory Visit: Payer: Medicaid Other

## 2021-05-09 ENCOUNTER — Encounter: Payer: Self-pay | Admitting: Gastroenterology

## 2021-05-23 ENCOUNTER — Ambulatory Visit: Payer: Medicaid Other

## 2021-06-09 ENCOUNTER — Other Ambulatory Visit: Payer: Self-pay

## 2021-06-09 ENCOUNTER — Ambulatory Visit (INDEPENDENT_AMBULATORY_CARE_PROVIDER_SITE_OTHER): Payer: Medicaid Other

## 2021-06-09 ENCOUNTER — Telehealth: Payer: Self-pay | Admitting: Internal Medicine

## 2021-06-09 DIAGNOSIS — E538 Deficiency of other specified B group vitamins: Secondary | ICD-10-CM

## 2021-06-09 MED ORDER — CYANOCOBALAMIN 1000 MCG/ML IJ SOLN
1000.0000 ug | Freq: Once | INTRAMUSCULAR | Status: AC
Start: 2021-06-09 — End: 2021-06-09
  Administered 2021-06-09: 1000 ug via INTRAMUSCULAR

## 2021-06-09 NOTE — Telephone Encounter (Signed)
Patient is wanting to know when the injections are supposed to start working for her. She says that she feels like they are not working.  Patient's contact number is [phone].  Please advise.

## 2021-06-09 NOTE — Progress Notes (Signed)
Per orders of Dr. Banks, injection of B12 given by Aishia Barkey E Gillian Meeuwsen. ?Patient tolerated injection well. ? ?

## 2021-06-10 NOTE — Telephone Encounter (Signed)
Patient is aware.  Patient is going to schedule a lab appointment to have her Vitamin D level checked.  She would like to know if she can also have her B12 checked?  Okay to answer via MyChart

## 2021-06-10 NOTE — Telephone Encounter (Signed)
Attempted to call the patient but the mailbox is full. °

## 2021-06-16 ENCOUNTER — Ambulatory Visit (HOSPITAL_COMMUNITY): Payer: Medicaid Other | Admitting: Licensed Clinical Social Worker

## 2021-06-18 ENCOUNTER — Encounter: Payer: Self-pay | Admitting: Gastroenterology

## 2021-06-18 ENCOUNTER — Ambulatory Visit (INDEPENDENT_AMBULATORY_CARE_PROVIDER_SITE_OTHER): Payer: Medicaid Other | Admitting: Gastroenterology

## 2021-06-18 ENCOUNTER — Telehealth: Payer: Self-pay

## 2021-06-18 VITALS — BP 110/70 | HR 80 | Ht 71.0 in | Wt 309.2 lb

## 2021-06-18 DIAGNOSIS — K5900 Constipation, unspecified: Secondary | ICD-10-CM | POA: Diagnosis not present

## 2021-06-18 DIAGNOSIS — R203 Hyperesthesia: Secondary | ICD-10-CM | POA: Diagnosis not present

## 2021-06-18 NOTE — Progress Notes (Signed)
HPI : Denise Barker is a pleasant 35 year old female with morbid obesity who is referred to [place] by Dr. Philip Aspen for further evaluation of abdominal pain.  Patient tells me she has had problems with this abdominal pain for over 10 years.  The pain seems to be more bothersome than usual recently and is affecting her quality of life.  She describes her pain as a very bothersome discomfort associated with anything touching her abdomen.  She is particularly bothered by having to wear tighter clothes.  Even some shirts cause pain with a touch her abdomen.  Pain is worse when she bends over.  Pain is worsened when she lays flat on her back.  When asked where her pain is located, she makes a belt-like with her hands over her pannus.  The pain used to be more well localized to the region around her umbilicus, but now the pain involves the lower abdomen.  She struggles with anxiety and depression and states that this pain has been worsening her psychiatric issues.  She also has nausea which is newer than the pain.  She feels like she is nauseated almost all the time independent of meals.  She vomits on occasion, about 4 times in the last month.  The vomiting is usually independent of meals.,  She has developed constipation in the form of difficulty with evacuation and straining.  She does have regular bowel movements, usually 1 or 2/day, but the stools are small-volume and unsatisfactory.  She has been taking an over-the-counter colon cleanse pill as needed for constipation, which does work well.  She has not tried taking any regular laxatives.  Diarrhea is not typically a problem for her.  She denies any blood in the stool.  Her appetite is okay and she is frustrated by her inability to lose weight. She was seen by GI provider in [facility] 2 weeks ago, who felt her pain was not related to her GI tract and referred her to pain management.  Past Medical History:  Diagnosis Date   Anxiety    Asthma     Depression    Gestational diabetes    Morbid obesity [facility])      Past Surgical History:  Procedure Laterality Date   CESAREAN SECTION N/A 11/23/2019   Procedure: CESAREAN SECTION;  Surgeon: Reva Bores, MD;  Location: [facility] LD [facility];  Service: Obstetrics;  Laterality: N/A;   NO PAST SURGERIES     Family History  Problem Relation Age of Onset   Depression Mother    Anxiety disorder Mother    Hypertension Mother    Depression Father    Anxiety disorder Father    Bronchitis Father    Social History   Tobacco Use   Smoking status: Never   Smokeless tobacco: Never  Vaping Use   Vaping Use: Never used  Substance Use Topics   Alcohol use: Not Currently    Comment: occasionally , not during pregnancy   Drug use: Yes    Types: Marijuana    Comment: Last used late May   Current Outpatient Medications  Medication Sig Dispense Refill   albuterol (VENTOLIN HFA) 108 (90 Base) MCG/ACT inhaler Inhale 1 puff into the lungs every 6 (six) hours as needed for wheezing or shortness of breath.     buPROPion (WELLBUTRIN XL) 300 MG 24 hr tablet Take 1 tablet (300 mg total) by mouth daily. 30 tablet 2   OVER THE COUNTER MEDICATION Prematine mist - once weekly  No current facility-administered medications for this visit.   No Known Allergies   Review of Systems: All systems reviewed and negative except where noted in HPI.    No results found.  Physical Exam: Wt (!) 309 lb 3.2 oz (140.3 kg)   BMI 43.12 kg/m  Constitutional: Pleasant,well-developed, obese African American female in no acute distress. HEENT: Normocephalic and atraumatic. Conjunctivae are normal. No scleral icterus. Neck supple.  Cardiovascular: Normal rate, regular rhythm.  Pulmonary/chest: Effort normal and breath sounds normal. No wheezing, rales or rhonchi. Abdominal: Soft, nondistended, diffuse tenderness to light palpation in the bilateral lower quadrants . Bowel sounds active throughout. There are no masses  palpable. No hepatomegaly. Extremities: no edema Lymphadenopathy: No cervical adenopathy noted. Neurological: Alert and oriented to person place and time. Skin: Skin is warm and dry. No rashes noted. Psychiatric: Normal mood and affect. Behavior is normal.  CBC    Component Value Date/Time   WBC 6.2 02/21/2021 0848   RBC 4.49 02/21/2021 0848   HGB 13.7 02/21/2021 0848   HGB 11.4 09/19/2019 0836   HCT 39.9 02/21/2021 0848   HCT 33.8 (L) 09/19/2019 0836   PLT 427.0 (H) 02/21/2021 0848   PLT 403 09/19/2019 0836   MCV 88.9 02/21/2021 0848   MCV 89 09/19/2019 0836   MCH 30.1 11/24/2019 0626   MCHC 34.3 02/21/2021 0848   RDW 13.4 02/21/2021 0848   RDW 13.0 09/19/2019 0836   LYMPHSABS 2.3 02/21/2021 0848   LYMPHSABS 1.9 06/01/2019 0942   MONOABS 0.5 02/21/2021 0848   EOSABS 0.2 02/21/2021 0848   EOSABS 0.2 06/01/2019 0942   BASOSABS 0.0 02/21/2021 0848   BASOSABS 0.0 06/01/2019 0942    CMP     Component Value Date/Time   NA 141 02/21/2021 0848   NA 135 09/19/2019 1019   K 4.2 02/21/2021 0848   CL 108 02/21/2021 0848   CO2 23 02/21/2021 0848   GLUCOSE 110 (H) 02/21/2021 0848   BUN 9 02/21/2021 0848   BUN 6 09/19/2019 1019   CREATININE 0.78 02/21/2021 0848   CALCIUM 9.3 02/21/2021 0848   PROT 6.9 02/21/2021 0848   PROT 6.1 09/19/2019 1019   ALBUMIN 4.0 02/21/2021 0848   ALBUMIN 3.4 (L) 09/19/2019 1019   AST 11 02/21/2021 0848   ALT 13 02/21/2021 0848   ALKPHOS 82 02/21/2021 0848   BILITOT 0.6 02/21/2021 0848   BILITOT 0.2 09/19/2019 1019   GFRNONAA >60 11/23/2019 0910   GFRAA >60 11/23/2019 0910     ASSESSMENT AND PLAN: 35 year old female with morbid obesity with longstanding abdominal pain, which is most associated with certain positions and activities and with anything touching the abdomen, most notably clothing.  Her description of the pain does not sound typical for gastrointestinal etiology.  The history and exam seem more consistent with a abdominal wall  source of pain or abdominal hyperesthesia.  There is no focal point of tenderness that would be amenable to abdominal wall injection.  I did offer an upper endoscopy for further evaluation, but I told her I felt like this was a very low yield test for her.  I think her pain would be better managed with a pain specialist and with optimization of the management of her anxiety and depression.  A medication like Cymbalta would be a good choice, but I am not comfortable with adding this medication to high patient is already on Wellbutrin.  I would defer to her PCM or to pain specialist for adding another centrally acting  agent.  For her constipation, I recommended a more regular laxative like MiraLAX and offered to write a prescription for her, but she declined.  As her pain preceded her constipation by many years, I do not think her constipation is related to this pain. The patient would like to be considered for bariatric surgery.  We will place referral.  Abdominal pain/abdominal hyperalgesia - Pain management referral - Consider Cymbalta for chronic pain  Constipation - Recommend daily Metamucil or MiraLAX  Morbid obesity - Refer to bariatric surgery   Derrel Moore E. Tomasa Rand, MD [facility]   CC:  Philip Aspen, [place]*

## 2021-06-18 NOTE — Patient Instructions (Signed)
If you are age 33 or older, your body mass index should be between 23-30. Your Body mass index is 43.12 kg/m. If this is out of the aforementioned range listed, please consider follow up with your Primary Care Provider.  If you are age 13 or younger, your body mass index should be between 19-25. Your Body mass index is 43.12 kg/m. If this is out of the aformentioned range listed, please consider follow up with your Primary Care Provider.   We will send a referral to Bariatric Surgery and Pain management and they will call you with a referral   The [facility] providers would like to encourage you to use [facility] to communicate with providers for non-urgent requests or questions.  Due to long hold times on the telephone, sending your provider a message by [facility] may be a faster and more efficient way to get a response.  Please allow 48 business hours for a response.  Please remember that this is for non-urgent requests.   It was a pleasure to see you today!  Thank you for trusting me with your gastrointestinal care!    Scott E. Tomasa Rand, MD

## 2021-06-18 NOTE — Telephone Encounter (Signed)
Called patient to let her know that she has to sign up for a class for the Referral sent to the Bariatric clinic. [email or web address].

## 2021-07-16 ENCOUNTER — Telehealth (INDEPENDENT_AMBULATORY_CARE_PROVIDER_SITE_OTHER): Payer: Medicaid Other | Admitting: Psychiatry

## 2021-07-16 ENCOUNTER — Encounter (HOSPITAL_COMMUNITY): Payer: Self-pay | Admitting: Psychiatry

## 2021-07-16 ENCOUNTER — Other Ambulatory Visit: Payer: Self-pay

## 2021-07-16 DIAGNOSIS — R112 Nausea with vomiting, unspecified: Secondary | ICD-10-CM

## 2021-07-16 DIAGNOSIS — F1290 Cannabis use, unspecified, uncomplicated: Secondary | ICD-10-CM | POA: Diagnosis not present

## 2021-07-16 DIAGNOSIS — F1994 Other psychoactive substance use, unspecified with psychoactive substance-induced mood disorder: Secondary | ICD-10-CM

## 2021-07-16 MED ORDER — BUPROPION HCL ER (XL) 300 MG PO TB24: 300.0000 mg | ORAL_TABLET | Freq: Every day | ORAL | 3 refills | Status: DC

## 2021-07-16 NOTE — Progress Notes (Signed)
BH MD/PA/NP OP Progress Note Virtual Visit via Video Note  I connected with Denise Barker on 07/16/21 at  2:00 PM EDT by a video enabled telemedicine application and verified that I am speaking with the correct person using two identifiers.  Location: Patient: Work Provider: Clinic   I discussed the limitations of evaluation and management by telemedicine and the availability of in person appointments. The patient expressed understanding and agreed to proceed.  I provided 30 minutes of non-face-to-face time during this encounter.   07/16/2021 2:19 PM Denise Barker  MRN:  [id number]  Chief Complaint: "I have only been taking the Wellbutrin and I have been vomiting a lot in the morning"  HPI: 35 year old female seen today for initial psychiatric evaluation.    She has a psychiatric history of substance-induced mood disorder, anxiety, and depression.  Currently she is managed on Wellbutrin XL 300 mg daily, Abilify 5 mg daily, and hydroxyzine 10 mg 3 times daily as needed.  She notes that she is only been taking the Wellbutrin and reports that it is effective in managing her psychiatric conditions.  Today she is well-groomed, pleasant, cooperative, engaged in conversation.  She informed provider that since her last visit she discontinued Abilify and hydroxyzine because she notes that they made her sleepy.  She informed Clinical research associate that she now only takes Wellbutrin and reports that the increase has helped her depression.  She also informed Clinical research associate that she has been less anxious.  Provider conducted a GAD-7 and patient scored a 7, at her last visit she scored an 11.  Provider also conducted a PHQ-9 and patient scored a 4, at her last visit she scored a 12.  Patient notes that since her last visit she has lost 10 pounds.  She informed Clinical research associate that she would like to lose 100 more and notes that she attempted to sign up for gastric bypass however was denied due to her insurance.  Patient notes that her sleep continues  to be poor noting that she sleeps 4 to 5 hours nightly however notes that at this time she does not want medications for sleep as she has a 43-year-old and needs to be alert to get to him.    Patient informed Clinical research associate that she continues to smoke marijuana every day.  She notes that she has been vomiting more in the morning and has abdominal pain daily.  Provider informed patient that cannabis can is introduced nausea and vomiting.  Provider also noted that since she is vomiting more she may have more GI upset.  She informed Clinical research associate that she plans to go to her primary care doctor so that they can help manage her vomiting.  Provider informed patient to research cannabis induced hyperemesis when she has time.  She endorsed understanding and agreed. Today she denies SI/HI/VAH or mania.  Patient notes at times she is paranoid.  She notes that she worries about her son and him being abducted.  She informed Clinical research associate that she no longer has visual or tactile hallucinations.  At this time patient does not want to restart Abilify or hydroxyzine.  She will continue Wellbutrin as prescribed and will follow-up with outpatient therapy for counseling.  No other concerns at this time.     Visit Diagnosis:    ICD-10-CM   1. Cannabinoid hyperemesis syndrome  R11.2    F12.90     2. Substance induced mood disorder (HCC)  F19.94 buPROPion (WELLBUTRIN XL) 300 MG 24 hr tablet      Past  Psychiatric History: Substance induced mood disorder, anxiety and depression  Past Medical History:  Past Medical History:  Diagnosis Date   Anxiety    Asthma    Depression    Gestational diabetes    Morbid obesity (HCC)     Past Surgical History:  Procedure Laterality Date   CESAREAN SECTION N/A 11/23/2019   Procedure: CESAREAN SECTION;  Surgeon: Reva Bores, MD;  Location: [facility] LD [facility]S;  Service: Obstetrics;  Laterality: N/A;   NO PAST SURGERIES      Family Psychiatric History: Mother depression and bipolar disorder. Paternal  grandmother anxiety. Paternal 3 cousins committed suicide and one paternal aunt  Family History:  Family History  Problem Relation Age of Onset   Depression Mother    Anxiety disorder Mother    Hypertension Mother    Depression Father    Anxiety disorder Father    Bronchitis Father    Colon polyps Neg Hx    Colon cancer Neg Hx     Social History:  Social History   Socioeconomic History   Marital status: Single    Spouse name: Not on file   Number of children: Not on file   Years of education: Not on file   Highest education level: Not on file  Occupational History   Not on file  Tobacco Use   Smoking status: Former    Types: Cigarettes   Smokeless tobacco: Never  Vaping Use   Vaping Use: Never used  Substance and Sexual Activity   Alcohol use: Not Currently    Comment: occasionally , not during pregnancy   Drug use: Yes    Types: Marijuana    Comment: Last used late May   Sexual activity: Yes    Birth control/protection: None  Other Topics Concern   Not on file  Social History Narrative   Not on file   Social Determinants of Health   Financial Resource Strain: Not on file  Food Insecurity: Food Insecurity Present   Worried About Running Out of Food in the Last Year: Often true   Ran Out of Food in the Last Year: Sometimes true  Transportation Needs: No Transportation Needs   Lack of Transportation (Medical): No   Lack of Transportation (Non-Medical): No  Physical Activity: Not on file  Stress: Not on file  Social Connections: Not on file    Allergies: No Known Allergies  Metabolic Disorder Labs: Lab Results  Component Value Date   HGBA1C CANCELED 06/01/2019   No results found for: PROLACTIN No results found for: CHOL, TRIG, HDL, CHOLHDL, VLDL, LDLCALC Lab Results  Component Value Date   TSH 2.22 02/21/2021    Therapeutic Level Labs: No results found for: LITHIUM No results found for: VALPROATE No components found for:  CBMZ  Current  Medications: Current Outpatient Medications  Medication Sig Dispense Refill   albuterol (VENTOLIN HFA) 108 (90 Base) MCG/ACT inhaler Inhale 1 puff into the lungs every 6 (six) hours as needed for wheezing or shortness of breath.     buPROPion (WELLBUTRIN XL) 300 MG 24 hr tablet Take 1 tablet (300 mg total) by mouth daily. 30 tablet 3   OVER THE COUNTER MEDICATION Prematine mist - once weekly     No current facility-administered medications for this visit.     Musculoskeletal: Strength & Muscle Tone:  Unable to assess due to telehealth visit Gait & Station:  Unable to assess due to telehealth visit Patient leans: N/A  Psychiatric Specialty Exam: Review of Systems  not currently breastfeeding.There is no height or weight on file to calculate BMI.  General Appearance: Well Groomed  Eye Contact:  Good  Speech:  Clear and Coherent and Normal Rate  Volume:  Normal  Mood:  Euthymic  Affect:  Appropriate and Congruent  Thought Process:  Coherent, Goal Directed, and Linear  Orientation:  Full (Time, Place, and Person)  Thought Content: Logical and Paranoid Ideation   Suicidal Thoughts:  No  Homicidal Thoughts:  No  Memory:  Immediate;   Good Recent;   Good Remote;   Good  Judgement:  Good  Insight:  Good  Psychomotor Activity:  Normal  Concentration:  Concentration: Good and Attention Span: Good  Recall:  Good  Fund of Knowledge: Good  Language: Good  Akathisia:  No  Handed:  Right  AIMS (if indicated): not done  Assets:  Communication Skills Desire for Improvement Financial Resources/Insurance Housing Physical Health Social Support  ADL's:  Intact  Cognition: WNL  Sleep:  Fair   Screenings: GAD-7    Flowsheet Row Video Visit from 07/16/2021 in [facility] Video Visit from 04/15/2021 in [facility] Office Visit from 11[facility]07/[facility] Office Visit from  08/08/2020 in Center for [facility] Video Visit from 11/13/2019 in Center for [facility]  Total GAD-7 Score 7 11 7 9  0      PHQ2-9    Flowsheet Row Video Visit from 07/16/2021 in [facility] Video Visit from 04/15/2021 in [facility] Office Visit from 02/18/2021 in [facility] Office Visit from 11/07/2020 in Center for 11/09/2020 Healthcare at [facility] Office Visit from 08/08/2020 in Center for 08/10/2020 Healthcare at [facility]  PHQ-2 Total Score 1 2 2 4 2   PHQ-9 Total Score 4 12 8 14 10       Flowsheet Row Video Visit from 04/15/2021 in [facility] ED from 12/10/2020 in [facility] from 07/12/2019 in Center for [facility]  C-SSRS RISK CATEGORY Error: Q7 should not be populated when Q6 is No No Risk Low Risk        Assessment and Plan: Patient endorses symptoms of continuous marijuana use such as hyperemesis.  She informed [place] that her anxiety and depression are well managed and notes that at this time she does not want to restart Abilify or hydroxyzine.  She will continue Wellbutrin as prescribed.  1. Substance induced mood disorder (HCC)  Continue- buPROPion (WELLBUTRIN XL) 300 MG 24 hr tablet; Take 1 tablet (300 mg total) by mouth daily.  Dispense: 30 tablet; Refill: 3  2. Cannabinoid hyperemesis syndrome  Follow-up in 91-month    [facility], NP 07/16/2021, 2:19 PM

## 2021-08-06 ENCOUNTER — Encounter: Payer: Self-pay | Admitting: Internal Medicine

## 2021-08-13 ENCOUNTER — Ambulatory Visit (INDEPENDENT_AMBULATORY_CARE_PROVIDER_SITE_OTHER): Payer: Medicaid Other | Admitting: Licensed Clinical Social Worker

## 2021-08-13 DIAGNOSIS — F411 Generalized anxiety disorder: Secondary | ICD-10-CM

## 2021-08-13 DIAGNOSIS — F331 Major depressive disorder, recurrent, moderate: Secondary | ICD-10-CM
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
## 2021-08-14 NOTE — Plan of Care (Signed)
  Work on managing self talk (Anxiety Disorder CCP Problem  1 literature)    STG: Patient will participate in at least 80% of scheduled individual psychotherapy sessions (Anxiety Disorder CCP Problem  1 indiv)    STG: Patient will complete at least 80% of assigned homework (Anxiety Disorder CCP Problem  1 anx)    STG: Report a decrease in anxiety symptoms as evidenced by an overall reduction in anxiety score by a minimum of 25% on the Generalized Anxiety Disorder Scale (Anxiety Disorder CCP Problem  1 GADS)    STG: Patient will reduce frequency of avoidant behaviors by 50% as evidenced by self-report in therapy sessions (Anxiety Disorder CCP Problem  1 indiv)

## 2021-09-30 ENCOUNTER — Ambulatory Visit (INDEPENDENT_AMBULATORY_CARE_PROVIDER_SITE_OTHER): Payer: Medicaid Other | Admitting: Licensed Clinical Social Worker

## 2021-09-30 DIAGNOSIS — F411 Generalized anxiety disorder: Secondary | ICD-10-CM

## 2021-10-01 NOTE — Progress Notes (Signed)
THERAPIST PROGRESS NOTE   Virtual Visit via Video Note  I connected with Denise Barker on 09/30/21 at  3:00 PM EST by a video enabled telemedicine application and verified that I am speaking with the correct person using two identifiers.  Location: Patient: Home Provider: [facility]   I discussed the limitations of evaluation and management by telemedicine and the availability of in person appointments. The patient expressed understanding and agreed to proceed. I discussed the assessment and treatment plan with the patient. The patient was provided an opportunity to ask questions and all were answered. The patient agreed with the plan and demonstrated an understanding of the instructions.   The patient was advised to call back or seek an in-person evaluation if the symptoms worsen or if the condition fails to improve as anticipated.  I provided 45 minutes of non-face-to-face time during this encounter.  Participation Level: Active  Behavioral Response: CasualAlertEuthymic  Type of Therapy: Individual Therapy  Treatment Goals addressed: Anxiety and Coping  Interventions: Supportive and Other: Additional assessment  Summary: Denise Barker is a 35 y.o. female who presents with hx of anx.  Today patient logs in for video session per schedule.  This is the first session since initial session on October 27.  LCSW assessed for any significant changes since initial session.  Patient reports she is off all of her medications and has been for over a month.  Denise Barker reports some things got mixed up with her Medicaid card and although this is straightened out she has not returned to use of medication.  Patient reports she feels she is still doing well without medication.  Patient states intent to keep her next medication management appointment on December 29 and talk with med provider about plan for meds.  Additional assessment reveals patient has excessive fatigue with no energy, continues to have problems with  social anxiety and getting herself to leave the home.  Patient reports she will get ready to go and get to the door and have a difficult time leaving.  Patient is continuing to work although reports she had a panic attack due to communication with a coworker that she feels did not go well.  Patient reports she feels she has a problem with "miss reading social cues".  When asked patient reports her last panic attack was last month.  Patient states she continues to primarily isolate, only going to work when she has to and to the store.  Patient continues to have goal of being able to get out and take her child out.  Pt reports wt loss goals. LCSW assessed for more details of patient's unresolved grief related to her father.  Patient reports she also has had multiple members of her graduating high school class die from drug overdoses or suicide.  She states she also lost relationships with her 2 best friends from high school after she had her son.  Denise Barker further states a big loss with her paternal grandmother whom she was very close to named Denise Barker.  Denise displays a tattoo with "Denise Barker" written on her arm.  Exploration of the death of her father reveals he had diabetes and was not taking it seriously.  She states her father was living with his girlfriend who she describes as an "idiot".  She reports she was not on speaking terms with her father at the time of his death because he was angry with her about a comment Denise made about his girlfriend.  Patient reports she was told  her father was not doing well, he refused to seek medical care and went to lay down.  Girlfriend reportedly checked on him about 20 minutes later and he was on the floor.  Girlfriend did not call 911, she went to get a neighbor who in turn called 911 but Denise Barker's father did not survive.  Patient states her grandmother developed dementia and was living with her father and girlfriend at the time of her death with lingering questions about their care of  the grandmother.  [name] reports regrets/disappointments about the funeral service the girlfriend organized for her father without including father's fam. Pt denies knowledge of the grief process.  LCSW provided education on stages of grief and typical grief patterns.  Patient verbalized understanding and reports validation. LCSW reviewed poc including scheduling prior to close of session. Pt states appreciation for care.   Suicidal/Homicidal: Nowithout intent/plan  Therapist Response: Pt receptive to care.  Plan: Return again in ~3 weeks. Provide grief lit.  Diagnosis: Axis I: Generalized Anxiety Disorder   [place] Sink, LCSW 10/01/2021

## 2021-10-03 ENCOUNTER — Encounter: Payer: Self-pay | Admitting: Internal Medicine

## 2021-10-03 ENCOUNTER — Ambulatory Visit (INDEPENDENT_AMBULATORY_CARE_PROVIDER_SITE_OTHER): Payer: Medicaid Other | Admitting: Internal Medicine

## 2021-10-03 VITALS — BP 110/80 | HR 85 | Temp 98.7°F | Wt 308.6 lb

## 2021-10-03 DIAGNOSIS — R5383 Other fatigue: Secondary | ICD-10-CM

## 2021-10-03 DIAGNOSIS — Z23 Encounter for immunization: Secondary | ICD-10-CM

## 2021-10-03 DIAGNOSIS — E538 Deficiency of other specified B group vitamins: Secondary | ICD-10-CM

## 2021-10-03 DIAGNOSIS — F333 Major depressive disorder, recurrent, severe with psychotic symptoms: Secondary | ICD-10-CM

## 2021-10-03 DIAGNOSIS — E559 Vitamin D deficiency, unspecified: Secondary | ICD-10-CM | POA: Diagnosis not present

## 2021-10-03 MED ORDER — CYANOCOBALAMIN 1000 MCG/ML IJ SOLN
1000.0000 ug | Freq: Once | INTRAMUSCULAR | Status: AC
Start: 2021-10-03 — End: 2021-10-03
  Administered 2021-10-03: 1000 ug via INTRAMUSCULAR

## 2021-10-03 NOTE — Addendum Note (Signed)
Addended by: Elita Boone E on: 10/03/2021 04:13 PM   Modules accepted: Orders

## 2021-10-03 NOTE — Progress Notes (Signed)
Established Patient Office Visit     This visit occurred during the SARS-CoV-2 public health emergency.  Safety protocols were in place, including screening questions prior to the visit, additional usage of staff PPE, and extensive cleaning of exam room while observing appropriate contact time as indicated for disinfecting solutions.    CC/Reason for Visit: Follow-up chronic conditions  HPI: Denise Barker is a 35 y.o. female who is coming in today for the above mentioned reasons. Past Medical History is significant for: Vitamin D and B12 deficiencies, depression followed by psychiatry now off medication due to lack of finances, morbid obesity.  She would like some advice on things to do to help her lose weight.  She is requesting a flu vaccine and a B12 injection today.  She has been fatigued.   Past Medical/Surgical History: Past Medical History:  Diagnosis Date   Anxiety    Asthma    Depression    Gestational diabetes    Morbid obesity [facility])     Past Surgical History:  Procedure Laterality Date   CESAREAN SECTION N/A 11/23/2019   Procedure: CESAREAN SECTION;  Surgeon: Reva Bores, MD;  Location: [facility] LD [facility]S;  Service: Obstetrics;  Laterality: N/A;   NO PAST SURGERIES      Social History:  reports that she has quit smoking. Her smoking use included cigarettes. She has never used smokeless tobacco. She reports that she does not currently use alcohol. She reports current drug use. Drug: Marijuana.  Allergies: No Known Allergies  Family History:  Family History  Problem Relation Age of Onset   Depression Mother    Anxiety disorder Mother    Hypertension Mother    Depression Father    Anxiety disorder Father    Bronchitis Father    Colon polyps Neg Hx    Colon cancer Neg Hx      Current Outpatient Medications:    albuterol (VENTOLIN HFA) 108 (90 Base) MCG/ACT inhaler, Inhale 1 puff into the lungs every 6 (six) hours as needed for wheezing or shortness of breath.,  Disp: , Rfl:    OVER THE COUNTER MEDICATION, Prematine mist - once weekly, Disp: , Rfl:    buPROPion (WELLBUTRIN XL) 300 MG 24 hr tablet, Take 1 tablet (300 mg total) by mouth daily. (Patient not taking: Reported on 10/03/2021), Disp: 30 tablet, Rfl: 3  Review of Systems:  Constitutional: Denies fever, chills, diaphoresis, appetite change. HEENT: Denies photophobia, eye pain, redness, hearing loss, ear pain, congestion, sore throat, rhinorrhea, sneezing, mouth sores, trouble swallowing, neck pain, neck stiffness and tinnitus.   Respiratory: Denies SOB, DOE, cough, chest tightness,  and wheezing.   Cardiovascular: Denies chest pain, palpitations and leg swelling.  Gastrointestinal: Denies nausea, vomiting, abdominal pain, diarrhea, constipation, blood in stool and abdominal distention.  Genitourinary: Denies dysuria, urgency, frequency, hematuria, flank pain and difficulty urinating.  Endocrine: Denies: hot or cold intolerance, sweats, changes in hair or nails, polyuria, polydipsia. Musculoskeletal: Denies myalgias, back pain, joint swelling, arthralgias and gait problem.  Skin: Denies pallor, rash and wound.  Neurological: Denies dizziness, seizures, syncope, weakness, light-headedness, numbness and headaches.  Hematological: Denies adenopathy. Easy bruising, personal or family bleeding history  Psychiatric/Behavioral: Denies suicidal ideation, mood changes, confusion, nervousness, sleep disturbance and agitation    Physical Exam: Vitals:   10/03/21 1533  BP: 110/80  Pulse: 85  Temp: 98.7 F (37.1 C)  TempSrc: Oral  SpO2: 98%  Weight: (!) 308 lb 9.6 oz (140 kg)  Body mass index is 43.04 kg/m.   Constitutional: NAD, calm, comfortable Eyes: PERRL, lids and conjunctivae normal ENMT: Mucous membranes are moist.  Respiratory: clear to auscultation bilaterally, no wheezing, no crackles. Normal respiratory effort. No accessory muscle use.  Cardiovascular: Regular rate and rhythm,  no murmurs / rubs / gallops. No extremity edema.  Neurologic: Grossly intact and nonfocal Psychiatric: Normal judgment and insight. Alert and oriented x 3. Normal mood.    Impression and Plan:  Vitamin D deficiency  - Plan: VITAMIN D 25 Hydroxy (Vit-D Deficiency, Fractures)  Vitamin B12 deficiency  - Plan: Vitamin B12 -B12 injection given in office today.  Morbid obesity ([place])  - Plan: CBC with Differential/Platelet, Comprehensive metabolic panel, TSH -Discussed healthy lifestyle, including increased physical activity and better food choices to promote weight loss.  Severe episode of recurrent major depressive disorder, with psychotic features ([place]) -Advised to touch base with her psychiatrist and therapist, she should be back on her Wellbutrin and Abilify.  Fatigue, unspecified type -Check above labs, suspect fatigue may be related to vitamin deficiencies, obesity as well as untreated depression.  Need for influenza vaccination -Flu vaccine administered today.  Time spent: 34 minutes reviewing chart, interviewing and examining patient, discussing weight loss strategies, formulating plan of care.    Lelon Frohlich, MD [facility]

## 2021-10-03 NOTE — Addendum Note (Signed)
Addended by: Kern Reap B on: 10/03/2021 04:25 PM   Modules accepted: Orders

## 2021-10-04 LAB — CBC WITH DIFFERENTIAL/PLATELET
Absolute Monocytes: 449 cells/uL (ref 200–950)
Basophils Absolute: 53 cells/uL (ref 0–200)
Basophils Relative: 0.8 %
Eosinophils Absolute: 290 cells/uL (ref 15–500)
Eosinophils Relative: 4.4 %
HCT: 39.2 % (ref 35.0–45.0)
Hemoglobin: 13.1 g/dL (ref 11.7–15.5)
Lymphs Abs: 3313 cells/uL (ref 850–3900)
MCH: 29.3 pg (ref 27.0–33.0)
MCHC: 33.4 g/dL (ref 32.0–36.0)
MCV: 87.7 fL (ref 80.0–100.0)
MPV: 10 fL (ref 7.5–12.5)
Monocytes Relative: 6.8 %
Neutro Abs: 2495 cells/uL (ref 1500–7800)
Neutrophils Relative %: 37.8 %
Platelets: 447 10*3/uL — ABNORMAL HIGH (ref 140–400)
RBC: 4.47 10*6/uL (ref 3.80–5.10)
RDW: 13.1 % (ref 11.0–15.0)
Total Lymphocyte: 50.2 %
WBC: 6.6 10*3/uL (ref 3.8–10.8)

## 2021-10-04 LAB — COMPREHENSIVE METABOLIC PANEL
AG Ratio: 1.3 (calc) (ref 1.0–2.5)
ALT: 10 U/L (ref 6–29)
AST: 12 U/L (ref 10–30)
Albumin: 3.9 g/dL (ref 3.6–5.1)
Alkaline phosphatase (APISO): 62 U/L (ref 31–125)
BUN: 11 mg/dL (ref 7–25)
CO2: 24 mmol/L (ref 20–32)
Calcium: 9.3 mg/dL (ref 8.6–10.2)
Chloride: 106 mmol/L (ref 98–110)
Creat: 0.73 mg/dL (ref 0.50–0.97)
Globulin: 3 g/dL (calc) (ref 1.9–3.7)
Glucose, Bld: 80 mg/dL (ref 65–99)
Potassium: 4.4 mmol/L (ref 3.5–5.3)
Sodium: 140 mmol/L (ref 135–146)
Total Bilirubin: 0.6 mg/dL (ref 0.2–1.2)
Total Protein: 6.9 g/dL (ref 6.1–8.1)

## 2021-10-04 LAB — TSH: TSH: 3.44 mIU/L

## 2021-10-04 LAB — VITAMIN D 25 HYDROXY (VIT D DEFICIENCY, FRACTURES): Vit D, 25-Hydroxy: 30 ng/mL (ref 30–100)

## 2021-10-04 LAB — VITAMIN B12: Vitamin B-12: >2000 pg/mL — ABNORMAL HIGH (ref 200–1100)

## 2021-10-16 ENCOUNTER — Other Ambulatory Visit: Payer: Self-pay

## 2021-10-16 ENCOUNTER — Telehealth (INDEPENDENT_AMBULATORY_CARE_PROVIDER_SITE_OTHER): Payer: Medicaid Other | Admitting: Psychiatry

## 2021-10-16 ENCOUNTER — Encounter (HOSPITAL_COMMUNITY): Payer: Self-pay | Admitting: Psychiatry

## 2021-10-16 DIAGNOSIS — F1994 Other psychoactive substance use, unspecified with psychoactive substance-induced mood disorder: Secondary | ICD-10-CM

## 2021-10-16 MED ORDER — ARIPIPRAZOLE 5 MG PO TABS
5.0000 mg | ORAL_TABLET | Freq: Every day | ORAL | 3 refills | Status: DC
  Filled 2021-10-16: qty 30, 30d supply, fill #0

## 2021-10-16 MED ORDER — BUPROPION HCL ER (XL) 150 MG PO TB24
300.0000 mg | ORAL_TABLET | Freq: Every day | ORAL | 3 refills | Status: DC
  Filled 2021-10-16: qty 30, 15d supply, fill #0

## 2021-10-16 NOTE — Progress Notes (Signed)
BH MD/PA/NP OP Progress Note Virtual Visit via Video Note  I connected with Denise Barker on 10/16/21 at  3:30 PM EST by a video enabled telemedicine application and verified that I am speaking with the correct person using two identifiers.  Location: Patient: Work Provider: Clinic   I discussed the limitations of evaluation and management by telemedicine and the availability of in person appointments. The patient expressed understanding and agreed to proceed.  I provided 30 minutes of non-face-to-face time during this encounter.   10/16/2021 3:39 PM Denise Barker  MRN:  [id number]  Chief Complaint: "I have not have my medications in a months and my body feels depressed"  HPI: 35 year old female seen today for follow up psychiatric evaluation.    She has a psychiatric history of substance-induced mood disorder, anxiety, and depression.  Currently she is managed on Wellbutrin XL 300 mg daily. She notes that she has not been able to take Wellbutrin because she could not afford it.  Today she is well-groomed, pleasant, cooperative, engaged in conversation.  She informed that since being off Wellbutrin her body feels depressed. She notes that she is tired most days however notes that she has been productive and coping with it. Patient notes that she uses marijuana daily to help manage her psychiatric conditions. She reports that she feels more paranoid noting that she believes she will be raped or something will happen to her 33 year old son. She also notes that she has been experiencing VH. Patient notes at times she sees dark spots which she thinks are bugs. Provider informed patient that Rolland Bimler can worsen theses symptoms. She however notes that she does not believe it is her marijuana consumption. Provider asked patient if she continues to have hyperemesis. She notes that she does but not as frequently. She notes that she sleeps approximately 5 hours nightly and endorses having an increased appetite  without weight gain.   Patient notes her anxiety and depression are minimal. Provider conducted a GAD-7 and patient scored a 3, at her last visit she scored an 7.  Provider also conducted a PHQ-9 and patient scored a 5, at her last visit she scored a 4.  Patient notes that she is interested in having a gastric sleeve. Provider recommended patient speaking to a nutritionist and referred her to one. Today she denies SI/HI/.  Today patient is agreeable to restarting Abilify 5 mg to help manage VH and paranoia. She will also restart Wellbutrin XL 150 mg to help manage depression. Patient referred to nutritionist.  She will continue a follow-up with outpatient therapy for counseling.  No other concerns at this time.     Visit Diagnosis:    ICD-10-CM   1. Substance induced mood disorder (HCC)  F19.94 buPROPion (WELLBUTRIN XL) 150 MG 24 hr tablet    Ambulatory referral to Nutrition and Diabetic Education      Past Psychiatric History: Substance induced mood disorder, anxiety and depression  Past Medical History:  Past Medical History:  Diagnosis Date   Anxiety    Asthma    Depression    Gestational diabetes    Morbid obesity (HCC)     Past Surgical History:  Procedure Laterality Date   CESAREAN SECTION N/A 11/23/2019   Procedure: CESAREAN SECTION;  Surgeon: Reva Bores, MD;  Location: [facility] LD [facility]S;  Service: Obstetrics;  Laterality: N/A;   NO PAST SURGERIES      Family Psychiatric History: Mother depression and bipolar disorder. Paternal grandmother anxiety. Paternal 3  cousins committed suicide and one paternal aunt  Family History:  Family History  Problem Relation Age of Onset   Depression Mother    Anxiety disorder Mother    Hypertension Mother    Depression Father    Anxiety disorder Father    Bronchitis Father    Colon polyps Neg Hx    Colon cancer Neg Hx     Social History:  Social History   Socioeconomic History   Marital status: Single    Spouse name: Not on file    Number of children: Not on file   Years of education: Not on file   Highest education level: Not on file  Occupational History   Not on file  Tobacco Use   Smoking status: Former    Types: Cigarettes   Smokeless tobacco: Never  Vaping Use   Vaping Use: Never used  Substance and Sexual Activity   Alcohol use: Not Currently    Comment: occasionally , not during pregnancy   Drug use: Yes    Types: Marijuana    Comment: Last used late May   Sexual activity: Yes    Birth control/protection: None  Other Topics Concern   Not on file  Social History Narrative   Not on file   Social Determinants of Health   Financial Resource Strain: Not on file  Food Insecurity: Not on file  Transportation Needs: Not on file  Physical Activity: Not on file  Stress: Not on file  Social Connections: Not on file    Allergies: No Known Allergies  Metabolic Disorder Labs: Lab Results  Component Value Date   HGBA1C CANCELED 06/01/2019   No results found for: PROLACTIN No results found for: CHOL, TRIG, HDL, CHOLHDL, VLDL, LDLCALC Lab Results  Component Value Date   TSH 3.44 10/03/2021   TSH 2.22 02/21/2021    Therapeutic Level Labs: No results found for: LITHIUM No results found for: VALPROATE No components found for:  CBMZ  Current Medications: Current Outpatient Medications  Medication Sig Dispense Refill   ARIPiprazole (ABILIFY) 5 MG tablet Take 1 tablet (5 mg total) by mouth daily. 30 tablet 3   albuterol (VENTOLIN HFA) 108 (90 Base) MCG/ACT inhaler Inhale 1 puff into the lungs every 6 (six) hours as needed for wheezing or shortness of breath.     buPROPion (WELLBUTRIN XL) 150 MG 24 hr tablet Take 2 tablets (300 mg total) by mouth daily. 30 tablet 3   OVER THE COUNTER MEDICATION Prematine mist - once weekly     No current facility-administered medications for this visit.     Musculoskeletal: Strength & Muscle Tone:  Unable to assess due to telehealth visit Gait & Station:   Unable to assess due to telehealth visit Patient leans: N/A  Psychiatric Specialty Exam: Review of Systems  not currently breastfeeding.There is no height or weight on file to calculate BMI.  General Appearance: Well Groomed  Eye Contact:  Good  Speech:  Clear and Coherent and Normal Rate  Volume:  Normal  Mood:  Euthymic  Affect:  Appropriate and Congruent  Thought Process:  Coherent, Goal Directed, and Linear  Orientation:  Full (Time, Place, and Person)  Thought Content: Logical, Hallucinations: Visual, and Paranoid Ideation   Suicidal Thoughts:  No  Homicidal Thoughts:  No  Memory:  Immediate;   Good Recent;   Good Remote;   Good  Judgement:  Good  Insight:  Good  Psychomotor Activity:  Normal  Concentration:  Concentration: Good and Attention Span:  Good  Recall:  Good  Fund of Knowledge: Good  Language: Good  Akathisia:  No  Handed:  Right  AIMS (if indicated): not done  Assets:  Communication Skills Desire for Improvement Financial Resources/Insurance Housing Physical Health Social Support  ADL's:  Intact  Cognition: WNL  Sleep:  Fair   Screenings: GAD-7    Flowsheet Row Video Visit from 10/16/2021 in [facility] Video Visit from 07/16/2021 in [facility] Video Visit from 04/15/2021 in [facility] Office Visit from 11/07/2020 in [facility] Office Visit from 08/08/2020 in [facility]  Total GAD-7 Score 3 7 11 7 9       PHQ2-9    Flowsheet Row Video Visit from 10/16/2021 in [facility] Office Visit from 10/03/2021 in [facility] Counselor from 08/13/2021 in [facility] Video Visit from 07/16/2021 in [facility] Video Visit from 04/15/2021 in [facility]  PHQ-2 Total Score 0 1 1 1 2   PHQ-9 Total Score 5 7 -- 4 12      Flowsheet Row Video Visit from 10/16/2021 in [facility] Counselor from 08/13/2021 in [facility] Video Visit from 04/15/2021 in [facility]  C-SSRS RISK CATEGORY No Risk Low Risk Error: Q7 should not be populated when Q6 is No        Assessment and Plan: Patient endorses symptoms of continuous marijuana use such as hyperemesis, VH, and paranoia.  She informed 04/17/2021 that her anxiety and depression are well managed. Today patient is agreeable to restarting Abilify 5 mg to help manage VH and paranoia. She will also restart Wellbutrin XL 150 mg to help manage depression. Patient referred to nutritionist for increased appetite. Medications sent to community health and wellness.   1. Substance induced mood disorder (HCC)  Continue- buPROPion (WELLBUTRIN XL) 300 MG 24 hr tablet; Take 1 tablet (300 mg total) by mouth daily.  Dispense: 30 tablet; Refill: 3  2. Cannabinoid hyperemesis syndrome  Follow-up in 34-month    Clinical research associate, NP 10/16/2021, 3:39 PM

## 2021-10-20 ENCOUNTER — Other Ambulatory Visit: Payer: Self-pay

## 2021-10-23 ENCOUNTER — Ambulatory Visit (HOSPITAL_COMMUNITY): Payer: Medicaid Other | Admitting: Licensed Clinical Social Worker

## 2021-11-13 ENCOUNTER — Ambulatory Visit (INDEPENDENT_AMBULATORY_CARE_PROVIDER_SITE_OTHER): Payer: Medicaid Other | Admitting: Licensed Clinical Social Worker

## 2021-11-13 DIAGNOSIS — F411 Generalized anxiety disorder: Secondary | ICD-10-CM | POA: Diagnosis not present

## 2021-11-13 NOTE — Progress Notes (Signed)
THERAPIST PROGRESS NOTE  Virtual Visit via Video Note  I connected with Denise Barker on 11/13/21 at  3:00 PM EST by a video enabled telemedicine application and verified that I am speaking with the correct person using two identifiers.  Location: Patient: Home Provider: [facility]   I discussed the limitations of evaluation and management by telemedicine and the availability of in person appointments. The patient expressed understanding and agreed to proceed. I discussed the assessment and treatment plan with the patient. The patient was provided an opportunity to ask questions and all were answered. The patient agreed with the plan and demonstrated an understanding of the instructions.   The patient was advised to call back or seek an in-person evaluation if the symptoms worsen or if the condition fails to improve as anticipated.  I provided 26 minutes of non-face-to-face time during this encounter.  Participation Level: Active  Behavioral Response: CasualAlertIn a state of shock  Type of Therapy: Individual Therapy  Treatment Goals addressed: Anxiety and Coping  Interventions: Solution Focused, Supportive, and Reframing  Summary: Denise Barker is a 36 y.o. female who presents with hx of GAD.  Today patient logs on for video session per schedule.  Patient last seen 09/30/2021.  LCSW notes patient is extremely low.  LCSW asked patient how she is doing.  Patient states "Not my best" and begins to cry.  Assessment of circumstances reveals patient saying about an hour ago the maintenance man and a lady from the office came to her door.  She states they came to see if she and her boyfriend were still in the apartment.  [name] then says the woman asked how she intended to come up with the back balance of rent that is due.  Patient asked what back balance was due and was told they owed more than $6000 and were going to be evicted.  Patient reports she was "totally clueless".  She states in October or  November of last year a similar thing happen and they were $3000 behind.  In that situation patient states she again knew nothing and reports her friends helped them get out of debt and keep their housing.  Patient reports she is incredibly confused.  She states she asks about bills and is told everything is fine.  Patient calls herself "stupid" and says that what is happened is her fault.  Patient's boyfriend, Denise Barker, is reportedly at work and will not be home until after 5:00.  Patient states she did call DJ and he said for her not to worry about it, that it was going to be taking care of and he was not sure why they came to the door.  Patient reports this may actually be the third time something like this is happened.  She has been with DJ for 4 years.  Patient reports before she got connected with him she was homeless and the last thing she ever wanted to happen was to be homeless again. Pt states she may have to go back to [place] but does not want to.  LCSW assessed for how she left the conversation with the employees at the complex.  Patient reports she is going to go to the office before 5:00 to talk to them as the woman at the door asked her to come to the office.  LCSW assisted patient to process all thoughts, feelings and facts it would be helpful for her to gather when she goes to the office.  LCSW validated patient's pain, trust  issues and fear of the immediate future.  LCSW restructured her self-defeating comments.  Instructed in deep breathing prior to going to the office.  Encourage patient to talk as calmly as possible with her boyfriend when he arrived home. LCSW reviewed poc including scheduling prior to close of session. Pt states appreciation for care.   Suicidal/Homicidal: Nowithout intent/plan  Therapist Response: Pt receptive to care.  Plan: Return again in ~2-3 wks.  Diagnosis: Axis I: Generalized Anxiety Disorder  [place], LCSW 11/13/2021

## 2021-11-17 ENCOUNTER — Telehealth (HOSPITAL_COMMUNITY): Payer: Self-pay | Admitting: Licensed Clinical Social Worker

## 2021-11-17 NOTE — Telephone Encounter (Signed)
LCSW attempted to call pt to check on her given circumstances revealed at last session. Call rang multiple times with no answer and no ability to leave vm as vm box full per outgoing message. °

## 2021-11-18 ENCOUNTER — Telehealth (HOSPITAL_COMMUNITY): Payer: Self-pay | Admitting: Licensed Clinical Social Worker

## 2021-11-18 NOTE — Telephone Encounter (Signed)
LCSW attempted to call pt to check on her given circumstances revealed at last session. Call rang multiple times with no answer and no ability to leave vm as vm box full per outgoing message.

## 2021-11-25 ENCOUNTER — Ambulatory Visit (INDEPENDENT_AMBULATORY_CARE_PROVIDER_SITE_OTHER): Payer: Medicaid Other | Admitting: Licensed Clinical Social Worker

## 2021-11-25 DIAGNOSIS — F411 Generalized anxiety disorder: Secondary | ICD-10-CM | POA: Diagnosis not present

## 2021-11-26 NOTE — Progress Notes (Signed)
THERAPIST PROGRESS NOTE  Virtual Visit via Video Note  I connected with Denise Barker on 11/25/21 at  2:00 PM EST by a video enabled telemedicine application and verified that I am speaking with the correct person using two identifiers.  Location: Patient: Home Provider: [facility]   I discussed the limitations of evaluation and management by telemedicine and the availability of in person appointments. The patient expressed understanding and agreed to proceed. I discussed the assessment and treatment plan with the patient. The patient was provided an opportunity to ask questions and all were answered. The patient agreed with the plan and demonstrated an understanding of the instructions.   The patient was advised to call back or seek an in-person evaluation if the symptoms worsen or if the condition fails to improve as anticipated.  I provided 21 minutes of non-face-to-face time during this encounter.  Participation Level: Active  Behavioral Response: CasualAlertEuthymic  Type of Therapy: Individual Therapy  Treatment Goals addressed: Anxiety and Coping  Interventions: Supportive  Summary: Denise Barker is a 36 y.o. female who presents with hx of GAD.  Today patient logged on for video session.  Patient is noted to be in uplifted mood.  LCSW advised of several attempts to call patient after last session to check on her but no answer and no ability to leave voice message.  Patient is apologetic and states she will try to fix her voicemail box today.  Administrative staff was able to reach patient this morning to add her to this clinician schedule today.  LCSW assessed for patient's overall status.  Patient provides updates related to her relationship with DJ and the status of their housing after he lied about paying rent.  Patient provides details of going to the office and getting a ledger, talking to him when he got off work, and involving his mother and his behavior.  Patient advises she was able  to address the whole matter staying calm as recommended which did help.  Patient states DJ was very remorseful, cried and apologized.  Patient reports they continued to have talks over several days.  She addressed trust and being open with communication. She advises she is now added onto all of their accounts and he is giving her the money for rent.  She states Department [employer] Social Services is going to help them get their rent caught up and her housing is secure.  Denise advises she did tell her boyfriend that if this ever happened again she would take their son and go back to [place].  When asked if she thought DJ would be open to counseling she states she thought he would be. She asks about him being able to come to [facility]. LCSW encouraged her to have DJ call for an appt. She states she is now working on letting the situation go and rebuilding trust.  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  She states this will be ideal hours for one of them to still be with their child.  Patient reports she is "excited" that she will be getting out of the house and states she has worked third shift jobs in the past.  LCSW assessed for when patient will be allowing herself to sleep.  Denise Barker states intent to lay down when she puts her child down for nap and reports she should also be able to get some rest after her husband gets  home and before she goes into work.  LCSW emphasized self-care and adequate rest to aid in coping.  LCSW congratulated patient on her new job.  Patient please she will be able to provide additional financial aid to the household.  Patient denies other worries or concerns today. LCSW reviewed poc including scheduling prior to close of session. Pt states appreciation for care.   Suicidal/Homicidal: Nowithout intent/plan  Therapist Response: Pt receptive to care.  Plan: Return again in ~3 weeks.  Diagnosis: Axis I:  Generalized Anxiety Disorder  Hermine Messick, LCSW 11/26/2021

## 2021-12-10 ENCOUNTER — Ambulatory Visit: Payer: Self-pay | Admitting: Registered"

## 2021-12-25 ENCOUNTER — Ambulatory Visit (HOSPITAL_COMMUNITY): Payer: Medicaid Other | Admitting: Licensed Clinical Social Worker

## 2021-12-25 ENCOUNTER — Encounter (HOSPITAL_COMMUNITY): Payer: Self-pay

## 2022-01-05 ENCOUNTER — Other Ambulatory Visit: Payer: Self-pay

## 2022-01-05 ENCOUNTER — Telehealth (HOSPITAL_COMMUNITY): Payer: Medicaid Other | Admitting: Psychiatry

## 2022-01-19 ENCOUNTER — Telehealth (HOSPITAL_COMMUNITY): Payer: Medicaid Other | Admitting: Psychiatry

## 2022-01-27 ENCOUNTER — Ambulatory Visit (HOSPITAL_COMMUNITY): Payer: Medicaid Other | Admitting: Licensed Clinical Social Worker

## 2022-02-03 ENCOUNTER — Ambulatory Visit: Payer: Self-pay | Admitting: Registered"

## 2022-02-05 ENCOUNTER — Telehealth (INDEPENDENT_AMBULATORY_CARE_PROVIDER_SITE_OTHER): Payer: Medicaid Other | Admitting: Psychiatry

## 2022-02-05 DIAGNOSIS — F1994 Other psychoactive substance use, unspecified with psychoactive substance-induced mood disorder: Secondary | ICD-10-CM

## 2022-02-05 DIAGNOSIS — F331 Major depressive disorder, recurrent, moderate: Secondary | ICD-10-CM

## 2022-02-05 MED ORDER — BUPROPION HCL ER (XL) 150 MG PO TB24: 300.0000 mg | ORAL_TABLET | Freq: Every day | ORAL | 2 refills | Status: DC

## 2022-02-05 MED ORDER — ARIPIPRAZOLE 10 MG PO TABS: 10.0000 mg | ORAL_TABLET | Freq: Every day | ORAL | 2 refills | Status: DC

## 2022-02-05 NOTE — Progress Notes (Signed)
BH MD/PA/NP OP Progress Note ? ?02/05/2022 5:26 PM ?Denise Barker  ?MRN:  [id number] ? ?Virtual Visit via Video Note ? ?I connected with Denise Noe on 02/05/22 at  4:30 PM EDT by a video enabled telemedicine application and verified that I am speaking with the correct person using two identifiers. ? ?Location: ?Patient: home ?Provider: offsite ?  ?I discussed the limitations of evaluation and management by telemedicine and the availability of in person appointments. The patient expressed understanding and agreed to proceed. ? ?  ?I discussed the assessment and treatment plan with the patient. The patient was provided an opportunity to ask questions and all were answered. The patient agreed with the plan and demonstrated an understanding of the instructions. ?  ?The patient was advised to call back or seek an in-person evaluation if the symptoms worsen or if the condition fails to improve as anticipated. ? ?I provided 5 minutes of non-face-to-face time during this encounter. ? ? ?Mcneil Sobericely Frona Yost, NP  ? ?Chief Complaint: Medication management ? ?HPI: Denise Barker is a 36 year old female [facility] for follow-up psychiatric evaluation.  Patient has a psychiatric history of anxiety and depression and her symptoms are managed with Abilify 5 mg daily and Wellbutrin 300 mg daily.  Patient reports that she continues to have periods of of depression and requests a medication adjustment today.  Patient denies adverse medication effects and states that she is medication compliant.  Patient is in agreement with increasing Abilify to 10 mg daily.  Medication benefits versus risks discussed. ? ?Patient is alert oriented x4, calm, pleasant and willing to engage.  She appears well-groomed and dressed appropriately for the weather.  Patient reports good sleep and appetite.  Patient denies suicidal or homicidal ideations, paranoia, delusions, hallucinations. ? ?Visit Diagnosis:  ?  ICD-10-CM   ?1.  MDD (major depressive disorder), recurrent episode, moderate (HCC)  F33.1   ?  ?2. Substance induced mood disorder (HCC)  F19.94 buPROPion (WELLBUTRIN XL) 150 MG 24 hr tablet  ?  ? ? ?Past Psychiatric History:  ? ?Past Medical History:  ?Past Medical History:  ?Diagnosis Date  ? Anxiety   ? Asthma   ? Depression   ? Gestational diabetes   ? Morbid obesity (HCC)   ?  ?Past Surgical History:  ?Procedure Laterality Date  ? CESAREAN SECTION N/A 11/23/2019  ? Procedure: CESAREAN SECTION;  Surgeon: Reva BoresPratt, Tanya S, MD;  Location: [facility];  Service: Obstetrics;  Laterality: N/A;  ? NO PAST SURGERIES    ? ? ?Family Psychiatric History:  ? ?Family History:  ?Family History  ?Problem Relation Age of Onset  ? Depression Mother   ? Anxiety disorder Mother   ? Hypertension Mother   ? Depression Father   ? Anxiety disorder Father   ? Bronchitis Father   ? Colon polyps Neg Hx   ? Colon cancer Neg Hx   ? ? ?Social History:  ?Social History  ? ?Socioeconomic History  ? Marital status: Single  ?  Spouse name: Not on file  ? Number of children: Not on file  ? Years of education: Not on file  ? Highest education level: Not on file  ?Occupational History  ? Not on file  ?Tobacco Use  ? Smoking status: Former  ?  Types: Cigarettes  ? Smokeless tobacco: Never  ?Vaping Use  ? Vaping Use: Never used  ?Substance and Sexual Activity  ? Alcohol use: Not Currently  ?  Comment: occasionally ,  not during pregnancy  ? Drug use: Yes  ?  Types: Marijuana  ?  Comment: Last used late May  ? Sexual activity: Yes  ?  Birth control/protection: None  ?Other Topics Concern  ? Not on file  ?Social History Narrative  ? Not on file  ? ?Social Determinants of Health  ? ?Financial Resource Strain: Not on file  ?Food Insecurity: Not on file  ?Transportation Needs: Not on file  ?Physical Activity: Not on file  ?Stress: Not on file  ?Social Connections: Not on file  ? ? ?Allergies: No Known Allergies ? ?Metabolic Disorder Labs: ?Lab Results  ?Component Value Date   ? HGBA1C CANCELED 06/01/2019  ? ?No results found for: PROLACTIN ?No results found for: CHOL, TRIG, HDL, CHOLHDL, VLDL, LDLCALC ?Lab Results  ?Component Value Date  ? TSH 3.44 10/03/2021  ? TSH 2.22 02/21/2021  ? ? ?Therapeutic Level Labs: ?No results found for: LITHIUM ?No results found for: VALPROATE ?No components found for:  CBMZ ? ?Current Medications: ?Current Outpatient Medications  ?Medication Sig Dispense Refill  ? albuterol (VENTOLIN HFA) 108 (90 Base) MCG/ACT inhaler Inhale 1 puff into the lungs every 6 (six) hours as needed for wheezing or shortness of breath.    ? ARIPiprazole (ABILIFY) 10 MG tablet Take 1 tablet (10 mg total) by mouth daily. 30 tablet 2  ? buPROPion (WELLBUTRIN XL) 150 MG 24 hr tablet Take 2 tablets (300 mg total) by mouth daily. 30 tablet 2  ? OVER THE COUNTER MEDICATION Prematine mist - once weekly    ? ?No current facility-administered medications for this visit.  ? ? ? ?Musculoskeletal: ?Strength & Muscle Tone: N/A virtual visit ?Gait & Station: N/A ?Patient leans: N/A ? ?Psychiatric Specialty Exam: ?Review of Systems  ?Psychiatric/Behavioral:  Negative for hallucinations, self-injury and suicidal ideas. The patient is not hyperactive.   ?All other systems reviewed and are negative.  ?There were no vitals taken for this visit.There is no height or weight on file to calculate BMI.  ?General Appearance: Well-groomed  ?Eye Contact: Good  ?Speech: Good  ?Volume: Good  ?Mood: Good  ?Affect:  Congruent  ?Thought Process:  Coherent  ?Orientation:  Full (Time, Place, and Person)  ?Thought Content: Logical   ?Suicidal Thoughts:  No  ?Homicidal Thoughts:  No  ?Memory: Good  ?Judgement: Good  ?Insight: Good  ?Psychomotor Activity: N/A  ?Concentration: Good  ?Recall: Good  ?Fund of Knowledge: Good  ?Language: Good  ?Akathisia: N/A  ?Handed: Right  ?AIMS (if indicated): not done  ?Assets:  Communication Skills  ?ADL's:  Intact  ?Cognition: WNL  ?Sleep:  Good  ? ?Screenings: ?GAD-7    ? ?Flowsheet Row Video Visit from 10/16/2021 in [facility] Video Visit from 07/16/2021 in [facility] Video Visit from 04/15/2021 in [facility] Office Visit from 11/07/2020 in [facility] Office Visit from 08/08/2020 in Center for [facility] at [facility]  ?Total GAD-7 Score 3 7 11 7 9   ? ?  ? ?PHQ2-9   ? ?Flowsheet Row Video Visit from 10/16/2021 in [facility] Office Visit from 10/03/2021 in [facility] [place] Counselor from 08/13/2021 in [facility] Video Visit from 07/16/2021 in [facility] Video Visit from 04/15/2021 in [facility]  ?PHQ-2 Total Score 0 1 1 1 2   ?PHQ-9 Total Score 5  7 -- 4 12  ? ?  ? ?Flowsheet Row Video Visit from 10/16/2021 in [facility] Counselor from 08/13/2021 in [facility] Video Visit from 04/15/2021 in [facility]  ?C-SSRS RISK CATEGORY No Risk Low Risk Error: Q7 should not be populated when Q6 is No  ? ?  ? ? ? ?Assessment and Plan: Denise Barker is a 35 year old female presenting to [facility] behavioral health outpatient for follow-up psychiatric evaluation.  Patient has a psychiatric history of anxiety and depression and her symptoms are managed with Abilify 5 mg daily and Wellbutrin 300 mg daily.  Patient reports that she continues to have periods of of depression and requests a medication adjustment today.  Patient denies adverse medication effects and states that she is medication compliant.  Patient is in agreement with increasing Abilify to 10 mg daily.  Medication benefits versus risks discussed.  Abilify 10 mg daily ordered.  Wellbutrin refilled at current dosage. ? ?Collaboration of Care:  Collaboration of Care: Medication Management AEB medications E scribed to patient's preferred pharmacy. ? ?1. Substance induced mood disorder (HCC) ? ?- buPROPion (WELLBUTRIN XL) 150 MG 24 hr tablet; Take 2 tablets (300 mg

## 2022-02-06 ENCOUNTER — Other Ambulatory Visit (HOSPITAL_COMMUNITY): Payer: Self-pay | Admitting: Psychiatry

## 2022-02-06 ENCOUNTER — Telehealth (HOSPITAL_COMMUNITY): Payer: Self-pay | Admitting: *Deleted

## 2022-02-06 NOTE — Telephone Encounter (Signed)
Pharmacy faxed a request for an authorization on patients Bupropion XL 150 mg BID. Note from pharmacy on fax request is her MCD will cover 150 mg or 1 300 mg QD if the provider would consider changing the sig and dose amount. Patients saw Kaka NP yesterday but she does not work today. Will put note in for she and her supervisor but It may not be addressed before Monday.  ?

## 2022-02-09 ENCOUNTER — Other Ambulatory Visit (HOSPITAL_COMMUNITY): Payer: Self-pay | Admitting: Psychiatry

## 2022-02-09 ENCOUNTER — Telehealth: Payer: Self-pay | Admitting: *Deleted

## 2022-02-09 DIAGNOSIS — F1994 Other psychoactive substance use, unspecified with psychoactive substance-induced mood disorder: Secondary | ICD-10-CM

## 2022-02-09 MED ORDER — BUPROPION HCL ER (XL) 300 MG PO TB24: 300.0000 mg | ORAL_TABLET | Freq: Every day | ORAL | 1 refills | Status: DC

## 2022-02-09 NOTE — Telephone Encounter (Signed)
Changed patient's Wellbutrin XL to 300 mg 1 daily.  Refill of 30 pills with 1 refill sent to [facility] pharmacy ?

## 2022-02-09 NOTE — Telephone Encounter (Signed)
PER Rx INSURANCE WILL NOT COVER  ?buPROPion (WELLBUTRIN XL) 150 MG 24 hr tablet ?Take 2 tablets (300 mg total) by mouth daily  ?  ?REQUESTED TO CHANGE  XL 300 MG TAB 1 QD ?

## 2022-03-18 ENCOUNTER — Encounter: Payer: Medicaid Other | Attending: Psychiatry | Admitting: Registered"

## 2022-03-18 ENCOUNTER — Encounter: Payer: Self-pay | Admitting: Registered"

## 2022-03-18 DIAGNOSIS — F1994 Other psychoactive substance use, unspecified with psychoactive substance-induced mood disorder: Secondary | ICD-10-CM | POA: Insufficient documentation

## 2022-03-18 DIAGNOSIS — Z713 Dietary counseling and surveillance: Secondary | ICD-10-CM

## 2022-03-18 NOTE — Patient Instructions (Signed)
-   Aim to have 3 meals day.   - Aim to have at least 3 food groups with breakfast, lunch, and dinner.   - Increase water intake to at least 4 bottles a day.   - Aim to reconnect with a therapist.   - Aim to increase movement to 1-2 times/week for at least 30 minutes per session.

## 2022-03-18 NOTE — Progress Notes (Signed)
Medical Nutrition Therapy  Appointment Start time:  11:32  Appointment End time:  12:25  Primary concerns today: better eating habits  Referral diagnosis: obesity Preferred learning style: no preference indicated Learning readiness:  (not ready, contemplating, ready, change in progress)   NUTRITION ASSESSMENT   Pt arrives stating her PCP referred her: Norva Riffle, PA ([facility]). Pt states she has been having stomach problems for years. States she has a sensitive stomach. Reports she has a GI appt next week and a bariatric appt the following week. States she hopes to have bariatric surgery or have medication to help lose weight.   Pt states she has challenges sleeping at night; sleeps about 4 hours/night. Reports she is no longer has therapist because they always leave and never tell her they are leaving. Reports she is tired of restarting her life story and then provider leaves. Reports sometimes she feels like she vomits a lot from eating. States she will eat 1 thing and vomit although it feels like she is overeating.  Some days she has poor appetite and some days, it feels she has a bottomless pit. States she doesn't eat again until boyfriend gets home from work, which she feels she learned from childhood because she would eat breakfast as a child and not eat again until mom returned home from work that evening. States she hasn't cooked recently due to feeling down. States she has been relying on [employer] which is also making her feel more depressed by eating it. States she quit drinking soda which was hard for her. States her job gives her $10 while working to consume their food; will order 2 hash browns + bacon, egg, and cheese biscuit with butter + cinnamon swirl (sometimes). States she will eat this instead of having breakfast at home on workdays.   States she feels like having more money would help her with having different food options and living a healthier lifestyle. States  she is looking to have a better paying job in 05/2022. States she has really bad anxiety and causes challenges with walking in her neighborhood and going outside. States she would rather walk at night instead of during the day to avoid people looking at her body and making fun of her. States she went to gym recently and used treadmill for 5 minutes and left because she was discouraged. States since becoming a mom, its hard to identify what she enjoys but recalls she likes comedy shows and movies. States she medication is the main way for her to manage her stress/anxiety.   States she works 8-24 hrs/week at [employer]. States she has to work on boyfriends off days from work.  States she has 76 year old. Recently moved here from [place] 3 years ago.    Clinical Medical Hx: See list Medications: See list Labs: not provided Notable Signs/Symptoms: none  Lifestyle & Dietary Hx  Estimated daily fluid intake: 141 oz Supplements: See list Sleep: 4-5 hours/night Stress / self-care: medication only;  Current average weekly physical activity: none reported  24-Hr Dietary Recall First Meal (9 am): kielbasa sausage + 3 eggs with cheese or [place] - 2 hash browns + bacon, egg, cheese biscuit, sweet tea Snack:  Second Meal: typically skips; sometimes pizza rolls or a big bowl of ice cream or chocolate Snack:  Third Meal (7:30 pm): [employer]-cheeseburger + fries + strawberry lemonade or curry chicken + ice cream Snack:  Beverages: water (3*16.9 oz; 51 oz), juice (4*12 oz; 48 oz), sweet  tea (2*21 oz; 42 oz); 141 oz   NUTRITION DIAGNOSIS  NB-1.1 Food and nutrition-related knowledge deficit As related to skipping meals.  As evidenced by dietary recall.   NUTRITION INTERVENTION  Nutrition education (E-1) on the following topics: Pt was educated and counseled on the benefits of eating throughout the day, eating to fuel the body, how skipping meals affects weight, and importance of mental health.  Pt was educated on the benefits of eating a variety of food groups at each meal. Discussed the purpose of each food group and ways to create balance with already established regimen. Discussed eating every 3-5 hours to help adequately nourish body and ways to increase water intake. Discussed role of physical activity. Pt agreed with goals listed.   Handouts Provided Include  Start Simple with MyPlate  Learning Style & Readiness for Change Teaching method utilized: Visual & Auditory  Demonstrated degree of understanding via: Teach Back  Barriers to learning/adherence to lifestyle change: food insecurity  Goals Established by Pt Aim to have 3 meals day.  Aim to have at least 3 food groups with breakfast, lunch, and dinner.  Increase water intake to at least 4 bottles a day.  Aim to reconnect with a therapist.  Aim to increase movement to 1-2 times/week for at least 30 minutes per session.    MONITORING & EVALUATION Dietary intake, weekly physical activity.  Next Steps  Patient is to follow-up prn.

## 2022-03-19 ENCOUNTER — Other Ambulatory Visit: Payer: Self-pay | Admitting: Physician Assistant

## 2022-03-19 DIAGNOSIS — R109 Unspecified abdominal pain: Secondary | ICD-10-CM

## 2022-04-01 ENCOUNTER — Other Ambulatory Visit: Payer: Medicaid Other

## 2022-04-14 ENCOUNTER — Ambulatory Visit
Admission: RE | Admit: 2022-04-14 | Discharge: 2022-04-14 | Disposition: A | Discharge status: Home / Self Care | Payer: Medicaid Other | Source: Ambulatory Visit | Attending: Physician Assistant | Admitting: Physician Assistant

## 2022-04-14 DIAGNOSIS — R109 Unspecified abdominal pain: Secondary | ICD-10-CM

## 2022-04-14 MED ORDER — IOPAMIDOL (ISOVUE-300) INJECTION 61%
100.0000 mL | Freq: Once | INTRAVENOUS | Status: AC | PRN
Start: 2022-04-14 — End: 2022-04-14
  Administered 2022-04-14: 100 mL via INTRAVENOUS

## 2022-04-30 ENCOUNTER — Encounter (HOSPITAL_COMMUNITY): Payer: Self-pay | Admitting: Psychiatry

## 2022-04-30 ENCOUNTER — Telehealth (INDEPENDENT_AMBULATORY_CARE_PROVIDER_SITE_OTHER): Payer: Medicaid Other | Admitting: Psychiatry

## 2022-04-30 DIAGNOSIS — F1994 Other psychoactive substance use, unspecified with psychoactive substance-induced mood disorder: Secondary | ICD-10-CM

## 2022-04-30 DIAGNOSIS — F22 Delusional disorders: Secondary | ICD-10-CM | POA: Diagnosis not present

## 2022-04-30 MED ORDER — RISPERIDONE 0.5 MG PO TABS: 0.5000 mg | ORAL_TABLET | Freq: Every day | ORAL | 3 refills | Status: DC

## 2022-04-30 NOTE — Progress Notes (Signed)
BH MD/PA/NP OP Progress Note Virtual Visit via Video Note  I connected with Denise Barker on 04/30/22 at  4:00 PM EDT by a video enabled telemedicine application and verified that I am speaking with the correct person using two identifiers.  Location: Patient: Work Provider: Clinic   I discussed the limitations of evaluation and management by telemedicine and the availability of in person appointments. The patient expressed understanding and agreed to proceed.  I provided 30 minutes of non-face-to-face time during this encounter.   04/30/2022 5:08 PM Denise Barker  MRN:  [id number]  Chief Complaint: "I'm paranoid"  HPI: 36 year old female seen today for follow up psychiatric evaluation.    She has a psychiatric history of substance-induced mood disorder, anxiety, and depression.  Currently she is managed on Wellbutrin XL 300 mg daily and Abilify 10 mg.. She notes that she discontinued her medications.   Today she  was unable to login virtually so her assessment was done over the phone. During exam she was pleasant, cooperative and engaged in conversation.  She informed that she feels paranoid. She notes that she found Abilify and Wellbutrin ineffective and notes that she does not want to restart them. Provider asked patient if she continues to use marijuana. She reports that she she has reduced her consumption to once daily. Provider informed patient that marijuana can contribute to her paranoia and encouraged her to discontinue her consumption. She endorsed understanding and reports that she will try.  Patient reports that her anxiety and depression are well managed. Provider conducted a GAD 7 and patient scored a 7. Provider also conducted a PHQ 9 and patient scored a 7. She endorses adequate appetite. She notes that she may have gastric bypass in 2024. Today she denies SI/HI/VAH or mania.    At this time patient does not want to restart Wellbutrin or Abilify. Today she is agreeable to start  Risperdal 0.5 mg to help manage psychosis and mood. She will continue all other medications as prescribed.  No other concerns at this time.     Visit Diagnosis:    ICD-10-CM   1. Paranoia (psychosis) (HCC)  F22 risperiDONE (RISPERDAL) 0.5 MG tablet    2. Substance induced mood disorder (HCC)  F19.94 risperiDONE (RISPERDAL) 0.5 MG tablet      Past Psychiatric History: Substance induced mood disorder, anxiety and depression  Past Medical History:  Past Medical History:  Diagnosis Date   Anxiety    Asthma    Depression    Gestational diabetes    Morbid obesity (HCC)     Past Surgical History:  Procedure Laterality Date   CESAREAN SECTION N/A 11/23/2019   Procedure: CESAREAN SECTION;  Surgeon: Reva Bores, MD;  Location: [facility] LD ORS;  Service: Obstetrics;  Laterality: N/A;   NO PAST SURGERIES      Family Psychiatric History: Mother depression and bipolar disorder. Paternal grandmother anxiety. Paternal 3 cousins committed suicide and one paternal aunt  Family History:  Family History  Problem Relation Age of Onset   Depression Mother    Anxiety disorder Mother    Hypertension Mother    Depression Father    Anxiety disorder Father    Bronchitis Father    Colon polyps Neg Hx    Colon cancer Neg Hx     Social History:  Social History   Socioeconomic History   Marital status: Single    Spouse name: Not on file   Number of children: Not on file   Years of  education: Not on file   Highest education level: Not on file  Occupational History   Not on file  Tobacco Use   Smoking status: Former    Types: Cigarettes   Smokeless tobacco: Never  Vaping Use   Vaping Use: Never used  Substance and Sexual Activity   Alcohol use: Not Currently    Comment: occasionally , not during pregnancy   Drug use: Yes    Types: Marijuana    Comment: Last used late May   Sexual activity: Yes    Birth control/protection: None  Other Topics Concern   Not on file  Social History  Narrative   Not on file   Social Determinants of Health   Financial Resource Strain: Not on file  Food Insecurity: Food Insecurity Present (03/18/2022)   Hunger Vital Sign    Worried About Running Out of Food in the Last Year: Sometimes true    Ran Out of Food in the Last Year: Sometimes true  Transportation Needs: No Transportation Needs (08/08/2020)   PRAPARE - Administrator, Civil Service (Medical): No    Lack of Transportation (Non-Medical): No  Physical Activity: Not on file  Stress: Not on file  Social Connections: Not on file    Allergies: No Known Allergies  Metabolic Disorder Labs: Lab Results  Component Value Date   HGBA1C CANCELED 06/01/2019   No results found for: "PROLACTIN" No results found for: "CHOL", "TRIG", "HDL", "CHOLHDL", "VLDL", "LDLCALC" Lab Results  Component Value Date   TSH 3.44 10/03/2021   TSH 2.22 02/21/2021    Therapeutic Level Labs: No results found for: "LITHIUM" No results found for: "VALPROATE" No results found for: "CBMZ"  Current Medications: Current Outpatient Medications  Medication Sig Dispense Refill   risperiDONE (RISPERDAL) 0.5 MG tablet Take 1 tablet (0.5 mg total) by mouth at bedtime. 30 tablet 3   albuterol (VENTOLIN HFA) 108 (90 Base) MCG/ACT inhaler Inhale 1 puff into the lungs every 6 (six) hours as needed for wheezing or shortness of breath.     omeprazole (PRILOSEC) 20 MG capsule Take 20 mg by mouth daily.     ondansetron (ZOFRAN-ODT) 8 MG disintegrating tablet Take 8 mg by mouth 3 (three) times daily as needed.     OVER THE COUNTER MEDICATION Prematine mist - once weekly     rosuvastatin (CRESTOR) 10 MG tablet Take 10 mg by mouth daily.     No current facility-administered medications for this visit.     Musculoskeletal: Strength & Muscle Tone:  Unable to assess due to telephone visit Gait & Station:  Unable to assess due to telephone visit Patient leans: N/A  Psychiatric Specialty Exam: Review of  Systems  There were no vitals taken for this visit.There is no height or weight on file to calculate BMI.  General Appearance:  Unable to assess due to telephone visit  Eye Contact:   Unable to assess due to telephone visit  Speech:  Clear and Coherent and Normal Rate  Volume:  Normal  Mood:  Euthymic  Affect:  Appropriate and Congruent  Thought Process:  Coherent, Goal Directed, and Linear  Orientation:  Full (Time, Place, and Person)  Thought Content: Logical and Paranoid Ideation   Suicidal Thoughts:  No  Homicidal Thoughts:  No  Memory:  Immediate;   Good Recent;   Good Remote;   Good  Judgement:  Good  Insight:  Good  Psychomotor Activity:   Unable to assess due to telephone visit  Concentration:  Concentration: Good and Attention Span: Good  Recall:  Good  Fund of Knowledge: Good  Language: Good  Akathisia:   Unable to assess due to telephone visit  Handed:  Right  AIMS (if indicated): not done  Assets:  Communication Skills Desire for Improvement Financial Resources/Insurance Housing Physical Health Social Support  ADL's:  Intact  Cognition: WNL  Sleep:  Fair   Screenings: GAD-7    Flowsheet Row Video Visit from 04/30/2022 in [facility] Video Visit from 10/16/2021 in [facility] Video Visit from 07/16/2021 in [facility] Video Visit from 04/15/2021 in [facility] Office Visit from 11/07/2020 in [facility]  Total GAD-7 Score 7 3 7 11 7       PHQ2-9    Flowsheet Row Video Visit from 04/30/2022 in [facility] from [facility]/18/2022 [facility] Video Visit from 10/16/2021 in [facility] Office Visit from 10/03/2021 in [facility] [place] [place] [facility] Counselor from 08/13/2021 in [facility]  PHQ-2 Total Score 2 3 0 1 1  PHQ-9 Total Score 7 12 5 7  --      Flowsheet Row Video Visit from 10/16/2021 in [facility] Counselor from 08/13/2021 in [facility] Video Visit from 04/15/2021 in [facility]  C-SSRS RISK CATEGORY No Risk Low Risk Error: Q7 should not be populated when Q6 is No        Assessment and Plan: Patient endorses symptoms of continuous marijuana and paranoia. She notes that she found Abilify and Wellbutrin ineffective.  Today she is agreeable to start Risperdal 0.5 mg to help manage psychosis and mood. She will continue all other medications as prescribed.   1. Paranoia (psychosis) (HCC)  Start- risperiDONE (RISPERDAL) 0.5 MG tablet; Take 1 tablet (0.5 mg total) by mouth at bedtime.  Dispense: 30 tablet; Refill: 3  2. Substance induced mood disorder (HCC)  Start- risperiDONE (RISPERDAL) 0.5 MG tablet; Take 1 tablet (0.5 mg total) by mouth at bedtime.  Dispense: 30 tablet; Refill: 3   Follow-up in 53-month    [facility], NP 04/30/2022, 5:08 PM

## 2022-08-06 ENCOUNTER — Telehealth (HOSPITAL_COMMUNITY): Payer: Medicaid Other | Admitting: Psychiatry
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
## 2022-08-09 NOTE — Patient Instructions (Signed)
Thank you for attending your appointment today.  -- Get a prolactin level at your PCP appointment this Wednesday and let me know of the result. For the time being, continue Risperdal as prescribed.  Please do not make any changes to medications without first discussing with your provider. If you are experiencing a psychiatric emergency, please call 911 or present to your nearest emergency department. Additional crisis, medication management, and therapy resources are included below.  [facility]  [address] [place], [place] [address] [phone] WALK-IN URGENT CARE 24/7 FOR ANYONE [address] [place], [place]  [phone] Fax: [phone] [email or web address] *Interpreters available *Accepts all insurance and uninsured for Urgent Care needs *Accepts Medicaid and uninsured for outpatient treatment (below)      ONLY FOR [facility]  Below:    Outpatient New Patient Assessment/Therapy Walk-ins:        Monday -Thursday 8am until slots are full.        Every Friday 1pm-4pm  (first come, first served)                   New Patient Psychiatry/Medication Management        Monday-Friday 8am-11am (first come, first served)               For all walk-ins we ask that you arrive by 7:15am, because patients will be seen in the order of arrival.

## 2022-08-10 ENCOUNTER — Telehealth (INDEPENDENT_AMBULATORY_CARE_PROVIDER_SITE_OTHER): Payer: Medicaid Other | Admitting: Psychiatry

## 2022-08-10 ENCOUNTER — Encounter (HOSPITAL_COMMUNITY): Payer: Self-pay

## 2022-08-10 ENCOUNTER — Encounter (HOSPITAL_COMMUNITY): Payer: Self-pay | Admitting: Psychiatry

## 2022-08-10 DIAGNOSIS — N643 Galactorrhea not associated with childbirth: Secondary | ICD-10-CM

## 2022-08-10 DIAGNOSIS — F439 Reaction to severe stress, unspecified: Secondary | ICD-10-CM

## 2022-08-10 DIAGNOSIS — F4310 Post-traumatic stress disorder, unspecified: Secondary | ICD-10-CM | POA: Insufficient documentation

## 2022-08-10 DIAGNOSIS — F1290 Cannabis use, unspecified, uncomplicated: Secondary | ICD-10-CM | POA: Diagnosis not present

## 2022-08-10 HISTORY — DX: Galactorrhea not associated with childbirth: N64.3

## 2022-08-18 ENCOUNTER — Encounter (HOSPITAL_COMMUNITY): Payer: Self-pay

## 2022-08-20 ENCOUNTER — Encounter: Payer: Self-pay | Admitting: *Deleted

## 2022-08-20 ENCOUNTER — Ambulatory Visit
Admission: EM | Admit: 2022-08-20 | Discharge: 2022-08-20 | Disposition: A | Discharge status: Home / Self Care | Payer: Medicaid Other | Attending: Urgent Care | Admitting: Urgent Care

## 2022-08-20 DIAGNOSIS — J4530 Mild persistent asthma, uncomplicated: Secondary | ICD-10-CM

## 2022-08-20 DIAGNOSIS — J069 Acute upper respiratory infection, unspecified: Secondary | ICD-10-CM | POA: Diagnosis not present

## 2022-08-20 MED ORDER — CETIRIZINE HCL 10 MG PO TABS: 10.0000 mg | ORAL_TABLET | Freq: Every day | ORAL | 0 refills | Status: AC

## 2022-08-20 MED ORDER — PROMETHAZINE-DM 6.25-15 MG/5ML PO SYRP: 5.0000 mL | ORAL_SOLUTION | Freq: Three times a day (TID) | ORAL | 0 refills | Status: DC | PRN

## 2022-08-20 MED ORDER — ALBUTEROL SULFATE HFA 108 (90 BASE) MCG/ACT IN AERS: 1.0000 | INHALATION_SPRAY | Freq: Four times a day (QID) | RESPIRATORY_TRACT | 0 refills | Status: AC | PRN

## 2022-08-20 MED ORDER — PREDNISONE 50 MG PO TABS: 50.0000 mg | ORAL_TABLET | Freq: Every day | ORAL | 0 refills | Status: DC

## 2022-08-20 NOTE — ED Provider Notes (Signed)
[facility] - URGENT CARE CENTER  Note:  This document was prepared using Systems analyst and may include unintentional dictation errors.  MRN: [id number] DOB: 12/19/1985  Subjective:   Denise Barker is a 36 y.o. female presenting for 5-day history of acute onset persistent sinus congestion, mild body aches, starting to have a cough.  Patient has had 1 sick contact with her son who is also being seen in our clinic today.  She has a history of asthma but has not needed an inhaler in some time.  No chest pain, shortness of breath, fevers.  Patient is not a smoker.  No current facility-administered medications for this encounter.  Current Outpatient Medications:    risperiDONE (RISPERDAL) 0.5 MG tablet, Take 1 tablet (0.5 mg total) by mouth at bedtime., Disp: 30 tablet, Rfl: 3   albuterol (VENTOLIN HFA) 108 (90 Base) MCG/ACT inhaler, Inhale 1 puff into the lungs every 6 (six) hours as needed for wheezing or shortness of breath. (Patient not taking: Reported on 08/10/2022), Disp: , Rfl:    omeprazole (PRILOSEC) 20 MG capsule, Take 20 mg by mouth daily., Disp: , Rfl:    ondansetron (ZOFRAN-ODT) 8 MG disintegrating tablet, Take 8 mg by mouth 3 (three) times daily as needed. (Patient not taking: Reported on 08/10/2022), Disp: , Rfl:    OVER THE COUNTER MEDICATION, Prematine mist - once weekly, Disp: , Rfl:    rosuvastatin (CRESTOR) 10 MG tablet, Take 10 mg by mouth daily., Disp: , Rfl:    No Known Allergies  Past Medical History:  Diagnosis Date   Anxiety    Asthma    Depression    Depression affecting pregnancy    Gestational diabetes    Morbid obesity [facility])      Past Surgical History:  Procedure Laterality Date   CESAREAN SECTION N/A 11/23/2019   Procedure: CESAREAN SECTION;  Surgeon: Donnamae Jude, MD;  Location: [facility] LD [facility]S;  Service: Obstetrics;  Laterality: N/A;    Family History  Problem Relation Age of Onset   Depression Mother    Anxiety disorder Mother     Hypertension Mother    Depression Father    Anxiety disorder Father    Bronchitis Father    Colon polyps Neg Hx    Colon cancer Neg Hx     Social History   Tobacco Use   Smoking status: Former    Types: Cigarettes   Smokeless tobacco: Never  Vaping Use   Vaping Use: Never used  Substance Use Topics   Alcohol use: Yes    Comment: rarely   Drug use: Yes    Types: Marijuana    Comment: "rarely"    ROS   Objective:   Vitals: BP 131/85   Pulse 99   Temp 98.1 F (36.7 C) (Oral)   Resp 18   SpO2 96%   Physical Exam Constitutional:      General: She is not in acute distress.    Appearance: Normal appearance. She is well-developed. She is not ill-appearing, toxic-appearing or diaphoretic.  HENT:     Head: Normocephalic and atraumatic.     Nose: Nose normal.     Mouth/Throat:     Mouth: Mucous membranes are moist.  Eyes:     General: No scleral icterus.       Right eye: No discharge.        Left eye: No discharge.     Extraocular Movements: Extraocular movements intact.  Cardiovascular:  Rate and Rhythm: Normal rate and regular rhythm.     Heart sounds: Normal heart sounds. No murmur heard.    No friction rub. No gallop.  Pulmonary:     Effort: Pulmonary effort is normal. No respiratory distress.     Breath sounds: No stridor. Wheezing (throughout) present. No rhonchi or rales.  Chest:     Chest wall: No tenderness.  Skin:    General: Skin is warm and dry.  Neurological:     General: No focal deficit present.     Mental Status: She is alert and oriented to person, place, and time.  Psychiatric:        Mood and Affect: Mood normal.        Behavior: Behavior normal.        Thought Content: Thought content normal.        Judgment: Judgment normal.     Assessment and Plan :   PDMP not reviewed this encounter.  1. Viral upper respiratory infection   2. Mild persistent asthma without complication     In the context of her asthma and pulmonary exam,  recommended refill of albuterol inhaler and an oral prednisone course.  Recommend supportive care otherwise for a viral upper respiratory infection.  Patient declined respiratory testing.  Counseled patient on potential for adverse effects with medications prescribed/recommended today, ER and return-to-clinic precautions discussed, patient verbalized understanding.    Wallis Bamberg, [place] 08/20/22 1835

## 2022-08-20 NOTE — ED Triage Notes (Signed)
C/O nasal congestion, body aches x 5 days without any known fevers. Very mild cough reported. Has been taking Nyquil.

## 2022-08-27 ENCOUNTER — Ambulatory Visit (INDEPENDENT_AMBULATORY_CARE_PROVIDER_SITE_OTHER): Payer: Medicaid Other | Admitting: Licensed Clinical Social Worker

## 2022-08-27 DIAGNOSIS — F331 Major depressive disorder, recurrent, moderate: Secondary | ICD-10-CM

## 2022-08-30 NOTE — Patient Instructions (Signed)
Thank you for attending your appointment today.  -- INCREASE Risperdal to 0.5 mg twice daily -- START Zoloft 25 mg daily for 6 days then INCREASE to 50 mg daily thereafter -- Continue other medications as prescribed.  Please do not make any changes to medications without first discussing with your provider. If you are experiencing a psychiatric emergency, please call 911 or present to your nearest emergency department. Additional crisis, medication management, and therapy resources are included below.  [facility]  [address], [place], [place] [address] [phone] WALK-IN URGENT CARE 24/7 FOR ANYONE [address], [place], [place]  [phone] Fax: [phone] [email or web address] *Interpreters available *Accepts all insurance and uninsured for Urgent Care needs *Accepts Medicaid and uninsured for outpatient treatment (below)      ONLY FOR [facility]  Below:    Outpatient New Patient Assessment/Therapy Walk-ins:        Monday -Thursday 8am until slots are full.        Every Friday 1pm-4pm  (first come, first served)                   New Patient Psychiatry/Medication Management        Monday-Friday 8am-11am (first come, first served)               For all walk-ins we ask that you arrive by 7:15am, because patients will be seen in the order of arrival.
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
## 2022-08-31 ENCOUNTER — Encounter (HOSPITAL_COMMUNITY): Payer: Self-pay | Admitting: Psychiatry

## 2022-08-31 ENCOUNTER — Telehealth (INDEPENDENT_AMBULATORY_CARE_PROVIDER_SITE_OTHER): Payer: Medicaid Other | Admitting: Psychiatry

## 2022-08-31 DIAGNOSIS — F1290 Cannabis use, unspecified, uncomplicated: Secondary | ICD-10-CM

## 2022-08-31 DIAGNOSIS — F419 Anxiety disorder, unspecified: Secondary | ICD-10-CM | POA: Diagnosis not present

## 2022-08-31 DIAGNOSIS — F439 Reaction to severe stress, unspecified: Secondary | ICD-10-CM | POA: Diagnosis not present

## 2022-08-31 DIAGNOSIS — F331 Major depressive disorder, recurrent, moderate: Secondary | ICD-10-CM

## 2022-08-31 MED ORDER — SERTRALINE HCL 50 MG PO TABS: ORAL_TABLET | ORAL | 1 refills | Status: DC

## 2022-08-31 MED ORDER — RISPERIDONE 0.5 MG PO TABS: 0.5000 mg | ORAL_TABLET | Freq: Two times a day (BID) | ORAL | 1 refills | Status: DC
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
## 2022-09-09 ENCOUNTER — Ambulatory Visit (HOSPITAL_COMMUNITY): Payer: Medicaid Other | Admitting: Licensed Clinical Social Worker

## 2022-10-04 NOTE — Patient Instructions (Signed)
Thank you for attending your appointment today.  -- We did not make any medication changes today; please continue medications as prescribed. If you notice nipple discharge worsens or becomes more bothersome, please reach out to discuss alternative medication options or addition of metformin.  Please do not make any changes to medications without first discussing with your provider. If you are experiencing a psychiatric emergency, please call 911 or present to your nearest emergency department. Additional crisis, medication management, and therapy resources are included below.  [facility]  [address] [place], [place] [address] [phone] WALK-IN URGENT CARE 24/7 FOR ANYONE [address], [place], [place]  [phone] Fax: [phone] [email or web address] *Interpreters available *Accepts all insurance and uninsured for Urgent Care needs *Accepts Medicaid and uninsured for outpatient treatment (below)      ONLY FOR [facility]  Below:    Outpatient New Patient Assessment/Therapy Walk-ins:        Monday -Thursday 8am until slots are full.        Every Friday 1pm-4pm  (first come, first served)                   New Patient Psychiatry/Medication Management        Monday-Friday 8am-11am (first come, first served)               For all walk-ins we ask that you arrive by 7:15am, because patients will be seen in the order of arrival.
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
## 2022-10-05 ENCOUNTER — Telehealth (INDEPENDENT_AMBULATORY_CARE_PROVIDER_SITE_OTHER): Payer: Medicaid Other | Admitting: Psychiatry

## 2022-10-05 ENCOUNTER — Encounter (HOSPITAL_COMMUNITY): Payer: Self-pay | Admitting: Psychiatry

## 2022-10-05 DIAGNOSIS — F411 Generalized anxiety disorder: Secondary | ICD-10-CM

## 2022-10-05 DIAGNOSIS — F330 Major depressive disorder, recurrent, mild: Secondary | ICD-10-CM | POA: Insufficient documentation

## 2022-10-05 DIAGNOSIS — F331 Major depressive disorder, recurrent, moderate: Secondary | ICD-10-CM

## 2022-10-05 DIAGNOSIS — F1290 Cannabis use, unspecified, uncomplicated: Secondary | ICD-10-CM | POA: Diagnosis not present

## 2022-10-05 DIAGNOSIS — N643 Galactorrhea not associated with childbirth: Secondary | ICD-10-CM

## 2022-10-05 DIAGNOSIS — F439 Reaction to severe stress, unspecified: Secondary | ICD-10-CM

## 2022-10-05 MED ORDER — RISPERIDONE 0.5 MG PO TABS: 1.0000 mg | ORAL_TABLET | Freq: Every day | ORAL | 2 refills | Status: DC

## 2022-10-05 MED ORDER — SERTRALINE HCL 50 MG PO TABS: 50.0000 mg | ORAL_TABLET | Freq: Every day | ORAL | 2 refills | Status: DC

## 2022-10-30 ENCOUNTER — Ambulatory Visit (INDEPENDENT_AMBULATORY_CARE_PROVIDER_SITE_OTHER): Payer: Medicaid Other | Admitting: Mental Health

## 2022-10-30 ENCOUNTER — Encounter (HOSPITAL_COMMUNITY): Payer: Self-pay

## 2022-10-30 DIAGNOSIS — F411 Generalized anxiety disorder: Secondary | ICD-10-CM

## 2022-10-30 DIAGNOSIS — F331 Major depressive disorder, recurrent, moderate: Secondary | ICD-10-CM | POA: Diagnosis not present

## 2022-10-30 DIAGNOSIS — F1290 Cannabis use, unspecified, uncomplicated: Secondary | ICD-10-CM
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
## 2022-11-12 ENCOUNTER — Ambulatory Visit: Payer: Medicaid Other | Admitting: Student

## 2022-11-20 ENCOUNTER — Ambulatory Visit (INDEPENDENT_AMBULATORY_CARE_PROVIDER_SITE_OTHER): Payer: Medicaid Other | Admitting: Mental Health

## 2022-11-20 DIAGNOSIS — F331 Major depressive disorder, recurrent, moderate: Secondary | ICD-10-CM

## 2022-11-20 DIAGNOSIS — F411 Generalized anxiety disorder: Secondary | ICD-10-CM

## 2022-11-20 DIAGNOSIS — F1290 Cannabis use, unspecified, uncomplicated: Secondary | ICD-10-CM

## 2022-11-20 NOTE — Progress Notes (Unsigned)
THERAPIST PROGRESS NOTE Virtual Visit via Video Note  I connected with Denise Barker on 11/20/22 at  9:00 AM EST by a video enabled telemedicine application and verified that I am speaking with the correct person using two identifiers.  Location: Patient: home address Provider: office   I discussed the limitations of evaluation and management by telemedicine and the availability of in person appointments. The patient expressed understanding and agreed to proceed.   I discussed the assessment and treatment plan with the patient. The patient was provided an opportunity to ask questions and all were answered. The patient agreed with the plan and demonstrated an understanding of the instructions.   The patient was advised to call back or seek an in-person evaluation if the symptoms worsen or if the condition fails to improve as anticipated.  I provided 56 minutes of non-face-to-face time during this encounter.   Denise Barker, [facility]   Session Time: 9:05am ( 56 minutes)  Participation Level: Active  Behavioral Response: CasualAlertDysphoric  Type of Therapy: Individual Therapy  Treatment Goals addressed: Denise Barker will increase manage of depression/anxiety AEB development of effective coping skills with ability to reframe maladaptive thinking 7/7 days within the next 90 days.    Denise Barker will increase socialization AEB engagement in community x 1 weekly within the next 90 days.   ProgressTowards Goals: Not Progressing  Interventions: CBT and Supportive  Summary: Denise Barker is a 37 y.o. female who presents with dx of major depression moderate, generalized anxiety and cannabis use. Denise Barker presents for session alert and oriented; mood and affect low; depressed. Speech clear and coherent at normal rate and tone. Engaged and receptive to interventions. Denise shares ongoing concerns for low mood. Shares factors that contribute to low mood are concerns for her weight, raising son with autism and  being away from family. Reports anxious thoughts of presenting in community with son and shares assumptions she believes. Shares desire to reduce and cease use of THC and engage in working out. Shares has had offer from friend at work to present to gym with them and plans to present next week. Shares to often have negative thinking patterns and to have poor self-esteem. Ableto process with therapist and identifies with likelihood of projecting thoughts of herself on to others having those thoughts about her. Shares presence of anxious thoughts/paranoid thoughts, " I am going to be kidnapped." Able to engage with therapist and explore ability to challenge thoughts and able to process 'could vs. Likelihood' and ability to examine the evidence for this thought. Agrees to work to increase awareness of distorted thinking and working to Scientist, research (physical sciences) of challenging thoughts. Denies SI/HI. Minimal sxs improvement secondary to medications, no progress made with goals and starting to work to reframe thoughts and increase socialization.   Suicidal/Homicidal: Nowithout intent/plan  Therapist Response:  Therapist engaged Denise in [facility] session. Assessed for current location and confidentiality of session. Completed check in and assessed for current level of stressors, sxs management and level of stressors. Provided safe space for Denise to share thoughts and feelings. Engaged [place] in identifying maladaptive thinking patterns/errors in her thinking with high in mind reading and future telling behaviors causing high anxiety and depression. Explored feelings of self-esteem. Engaged in education on ability to engage in challenging thoughts and lead through skill of examining the evidence. Reviewed positive changes she would like to make and desire to follow through with behavior changes. Reviewed session and provided feedback.  Plan: Return again in x 3 weeks.  Diagnosis: MDD (major depressive disorder), recurrent  episode, moderate (HCC)  Generalized anxiety disorder  Use of cannabis  Collaboration of Care: Other None  Patient/Guardian was advised Release of Information must be obtained prior to any record release in order to collaborate their care with an outside provider. Patient/Guardian was advised if they have not already done so to contact the registration department to sign all necessary forms in order for [place] to release information regarding their care.   Consent: Patient/Guardian gives verbal consent for treatment and assignment of benefits for services provided during this visit. Patient/Guardian expressed understanding and agreed to proceed.   [name] [place], [facility] 11/20/2022

## 2022-12-04 ENCOUNTER — Telehealth (INDEPENDENT_AMBULATORY_CARE_PROVIDER_SITE_OTHER): Payer: Medicaid Other | Admitting: Psychiatry

## 2022-12-04 ENCOUNTER — Encounter (HOSPITAL_COMMUNITY): Payer: Self-pay | Admitting: Psychiatry

## 2022-12-04 DIAGNOSIS — F22 Delusional disorders: Secondary | ICD-10-CM | POA: Diagnosis not present

## 2022-12-04 DIAGNOSIS — F331 Major depressive disorder, recurrent, moderate: Secondary | ICD-10-CM | POA: Diagnosis not present

## 2022-12-04 DIAGNOSIS — F439 Reaction to severe stress, unspecified: Secondary | ICD-10-CM | POA: Diagnosis not present

## 2022-12-04 MED ORDER — SERTRALINE HCL 50 MG PO TABS: 50.0000 mg | ORAL_TABLET | Freq: Every day | ORAL | 3 refills | Status: DC

## 2022-12-04 MED ORDER — ARIPIPRAZOLE 10 MG PO TABS: 10.0000 mg | ORAL_TABLET | Freq: Every day | ORAL | 3 refills | Status: DC

## 2022-12-04 NOTE — Progress Notes (Signed)
BH MD/PA/NP OP Progress Note Virtual Visit via Video Note  I connected with Denise Barker on 12/04/22 at 10:30 PM EST by a video enabled telemedicine application and verified that I am speaking with the correct person using two identifiers.  Location: Patient: Work Provider: Clinic   I discussed the limitations of evaluation and management by telemedicine and the availability of in person appointments. The patient expressed understanding and agreed to proceed.  I provided 30 minutes of non-face-to-face time during this encounter.   12/04/2022 11:24 AM Dashonda Piascik  MRN:  [id number]  Chief Complaint: "I do not like my breast leaking"  HPI: 37 year old female seen today for follow up psychiatric evaluation.    She has a psychiatric history of substance-induced mood disorder, anxiety, paranoia, and depression.  Currently she is managed on Risperdal 0.5 mg daily and Zoloft 50 mg daily. She notes that she has been out of her medications for a few weeks.   Today she  was well-groomed, pleasant, cooperative, and engaged in conversation.  She informed Probation officer that she does not like her breast leaking.  She informed Probation officer that she would likes Risperdal because it caused her not to have hallucinations.  She notes that she has not had a hallucination in a few months.  She does note that she feels paranoid daily.  Patient informed Probation officer that she feels that something will happen to her or her son.  She also notes that at times she feels that ghost or other people are following her.  Patient became tearful when discussing her paranoia.  She notes that she feels that it stems from her childhood noting that her parents followed her around and constantly told her to be aware of her surroundings.  Patient notes that she works third shift as a Presenter, broadcasting and notes that this is not beneficial for her mental health as at times she is scared.  She also notes that she worries about her son who has special needs.  Patient  informed Probation officer that she was about death and finances.  Today provider conducted a GAD-7 and patient scored a 15.  Provider also conducted a PHQ-9 and patient scored a 13.  She endorses adequate sleep and appetite.    At times patient notes that she has racing thoughts and irritability but denies other symptoms of mania.  Today she endorses passive SI but denies wanting to harm herself.  Today she denies SI/HI/VAH.    Provider discussed starting Zyprexa or Caplyta as they are less likely to elevate prolactin levels.  Provider discussed risk and benefits of both but patient was not agreeable to either.  Provider also discussed Abilify but informed patient that she stopped it 04/2022 noting that it was ineffective.  She endorsed understanding however notes that she would like to retry it.  Provider was agreeable to refilling Abilify 10 mg to help manage paranoia and depression.  Provider also informed patient that marijuana can exacerbate her mental health and encouraged her to reduce/discontinue her consumption.  She informed Probation officer that she has reduced her consumption but endorsed understanding.  Patient will continue Zoloft as prescribed.  Potential side effects of medication and risks vs benefits of treatment vs non-treatment were explained and discussed. All questions were answered.No other concerns noted at this time. Visit Diagnosis:    ICD-10-CM   1. MDD (major depressive disorder), recurrent episode, moderate (HCC)  F33.1 sertraline (ZOLOFT) 50 MG tablet    2. Trauma and stressor-related disorder  F43.9 sertraline (ZOLOFT)  50 MG tablet      Past Psychiatric History: Substance induced mood disorder, anxiety and depression  Past Medical History:  Past Medical History:  Diagnosis Date   Anxiety    Asthma    Depression    Depression affecting pregnancy    Gestational diabetes    Morbid obesity ([employer])     Past Surgical History:  Procedure Laterality Date   CESAREAN SECTION N/A 11/23/2019    Procedure: CESAREAN SECTION;  Surgeon: Donnamae Jude, MD;  Location: [facility];  Service: Obstetrics;  Laterality: N/A;    Family Psychiatric History: Mother depression and bipolar disorder. Paternal grandmother anxiety. Paternal 3 cousins committed suicide and one paternal aunt  Family History:  Family History  Problem Relation Age of Onset   Depression Mother    Anxiety disorder Mother    Hypertension Mother    Depression Father    Anxiety disorder Father    Bronchitis Father    Colon polyps Neg Hx    Colon cancer Neg Hx     Social History:  Social History   Socioeconomic History   Marital status: Single    Spouse name: Not on file   Number of children: Not on file   Years of education: Not on file   Highest education level: Not on file  Occupational History   Not on file  Tobacco Use   Smoking status: Former    Types: Cigarettes   Smokeless tobacco: Never  Vaping Use   Vaping Use: Never used  Substance and Sexual Activity   Alcohol use: Yes    Comment: rarely   Drug use: Yes    Types: Marijuana    Comment: 1.5 blunts a day   Sexual activity: Yes    Birth control/protection: Implant  Other Topics Concern   Not on file  Social History Narrative   Not on file   Social Determinants of Health   Financial Resource Strain: Not on file  Food Insecurity: Food Insecurity Present (03/18/2022)   Hunger Vital Sign    Worried About Running Out of Food in the Last Year: Sometimes true    Ran Out of Food in the Last Year: Sometimes true  Transportation Needs: No Transportation Needs (08/08/2020)   PRAPARE - Hydrologist (Medical): No    Lack of Transportation (Non-Medical): No  Physical Activity: Not on file  Stress: Not on file  Social Connections: Not on file    Allergies: No Known Allergies  Metabolic Disorder Labs: Lab Results  Component Value Date   HGBA1C CANCELED 06/01/2019   No results found for: "PROLACTIN" No results  found for: "CHOL", "TRIG", "HDL", "CHOLHDL", "VLDL", "LDLCALC" Lab Results  Component Value Date   TSH 3.44 10/03/2021   TSH 2.22 02/21/2021    Therapeutic Level Labs: No results found for: "LITHIUM" No results found for: "VALPROATE" No results found for: "CBMZ"  Current Medications: Current Outpatient Medications  Medication Sig Dispense Refill   ARIPiprazole (ABILIFY) 10 MG tablet Take 1 tablet (10 mg total) by mouth daily. 30 tablet 3   albuterol (VENTOLIN HFA) 108 (90 Base) MCG/ACT inhaler Inhale 1-2 puffs into the lungs every 6 (six) hours as needed for wheezing or shortness of breath. 18 g 0   cetirizine (ZYRTEC ALLERGY) 10 MG tablet Take 1 tablet (10 mg total) by mouth daily. (Patient not taking: Reported on 08/31/2022) 30 tablet 0   levothyroxine (SYNTHROID) 25 MCG tablet Take 25 mcg by mouth daily.  OVER THE COUNTER MEDICATION Prematine mist - once weekly     predniSONE (DELTASONE) 50 MG tablet Take 1 tablet (50 mg total) by mouth daily with breakfast. (Patient not taking: Reported on 08/31/2022) 5 tablet 0   promethazine-dextromethorphan (PROMETHAZINE-DM) 6.25-15 MG/5ML syrup Take 5 mLs by mouth 3 (three) times daily as needed for cough. (Patient not taking: Reported on 08/31/2022) 100 mL 0   sertraline (ZOLOFT) 50 MG tablet Take 1 tablet (50 mg total) by mouth daily. 30 tablet 3   No current facility-administered medications for this visit.     Musculoskeletal: Strength & Muscle Tone: within normal limits and telehealth visit Gait & Station: normal, telehealth visit Patient leans: N/A  Psychiatric Specialty Exam: Review of Systems  There were no vitals taken for this visit.There is no height or weight on file to calculate BMI.  General Appearance: Well Groomed  Eye Contact:  Good  Speech:  Clear and Coherent and Normal Rate  Volume:  Normal  Mood:  Euthymic  Affect:  Appropriate and Congruent  Thought Process:  Coherent, Goal Directed, and Linear  Orientation:   Full (Time, Place, and Person)  Thought Content: Logical and Paranoid Ideation   Suicidal Thoughts:  Yes.  without intent/plan  Homicidal Thoughts:  No  Memory:  Immediate;   Good Recent;   Good Remote;   Good  Judgement:  Good  Insight:  Good  Psychomotor Activity:  Normal  Concentration:  Concentration: Good and Attention Span: Good  Recall:  Good  Fund of Knowledge: Good  Language: Good  Akathisia:  No  Handed:  Right  AIMS (if indicated): not done  Assets:  Communication Skills Desire for Improvement Financial Resources/Insurance Housing Physical Health Social Support  ADL's:  Intact  Cognition: WNL  Sleep:  Fair   Screenings: GAD-7    Flowsheet Row Video Visit from 12/04/2022 in [facility] Counselor from 10/30/2022 in [facility] Video Visit from 04/30/2022 in [facility] Video Visit from 10/16/2021 in [facility] Video Visit from 07/16/2021 in [facility]  Total GAD-7 Score 15 6 7 3 7      $ PHQ2-9    Flowsheet Row Video Visit from 12/04/2022 in [facility] Counselor from 10/30/2022 in [facility] Counselor from 08/27/2022 in [facility] Video Visit from 04/30/2022 in [facility] Nutrition from 03/18/2022 in [place] [facility] Total Score 4 2 4 2 3  $ PHQ-9 Total Score 13 4 14 7 12      $ Flowsheet Row Video Visit from 12/04/2022 in [facility] Counselor from 10/30/2022 in [facility] Counselor from 08/27/2022 in [place] Error: Q7 should not be populated when Q6 is No Low Risk Low Risk        Assessment and Plan: Patient endorses symptoms of continuous  marijuana use, anxiety, and paranoia. Provider discussed starting Zyprexa or Caplyta as they are less likely to elevate prolactin levels.  Provider discussed risk and benefits of both but patient was not agreeable to either.  Provider also discussed Abilify but informed patient that she stopped it 04/2022 noting that it was ineffective.  She endorsed understanding however notes that she would like to retry it.  Provider was agreeable to refilling Abilify 10 mg to help manage  paranoia and depression.  Provider also informed patient that marijuana can exacerbate her mental health and encouraged her to reduce/discontinue her consumption.  She informed Probation officer that she has reduced her consumption but endorsed understanding.  Patient will continue Zoloft as prescribed.  1. MDD (major depressive disorder), recurrent episode, moderate (HCC)  Restart- ARIPiprazole (ABILIFY) 10 MG tablet; Take 1 tablet (10 mg total) by mouth daily.  Dispense: 30 tablet; Refill: 3 Continue- sertraline (ZOLOFT) 50 MG tablet; Take 1 tablet (50 mg total) by mouth daily.  Dispense: 30 tablet; Refill: 3  2. Trauma and stressor-related disorder  Continue- sertraline (ZOLOFT) 50 MG tablet; Take 1 tablet (50 mg total) by mouth daily.  Dispense: 30 tablet; Refill: 3  3. Paranoia (psychosis) ([place])  Restart- ARIPiprazole (ABILIFY) 10 MG tablet; Take 1 tablet (10 mg total) by mouth daily.  Dispense: 30 tablet; Refill: 3  Follow-up in 30-month   BSalley Slaughter NP 12/04/2022, 11:24 AM

## 2022-12-07 ENCOUNTER — Telehealth (HOSPITAL_COMMUNITY): Payer: Medicaid Other | Admitting: Psychiatry

## 2022-12-18 ENCOUNTER — Ambulatory Visit (INDEPENDENT_AMBULATORY_CARE_PROVIDER_SITE_OTHER): Payer: Medicaid Other | Admitting: Mental Health

## 2022-12-18 DIAGNOSIS — F411 Generalized anxiety disorder: Secondary | ICD-10-CM

## 2022-12-18 DIAGNOSIS — F331 Major depressive disorder, recurrent, moderate: Secondary | ICD-10-CM | POA: Diagnosis not present

## 2022-12-18 DIAGNOSIS — F1290 Cannabis use, unspecified, uncomplicated: Secondary | ICD-10-CM

## 2022-12-18 NOTE — Progress Notes (Signed)
THERAPIST PROGRESS NOTE Virtual Visit via Video Note  I connected with [place] Marsee on 12/18/22 at 10:00 AM EST by a video enabled telemedicine application and verified that I am speaking with the correct person using two identifiers.  Location: Patient: home address on file Provider: office    I discussed the limitations of evaluation and management by telemedicine and the availability of in person appointments. The patient expressed understanding and agreed to proceed.  I discussed the assessment and treatment plan with the patient. The patient was provided an opportunity to ask questions and all were answered. The patient agreed with the plan and demonstrated an understanding of the instructions.   The patient was advised to call back or seek an in-person evaluation if the symptoms worsen or if the condition fails to improve as anticipated.  I provided 55 minutes of non-face-to-face time during this encounter.   Marion Downer, [facility]   Session Time: 10:05 am ( 55 minutes)   Participation Level: Active  Behavioral Response: CasualAlertDysphoric  Type of Therapy: Individual Therapy  Treatment Goals addressed: Karyn will increase manage of depression/anxiety AEB development of effective coping skills with ability to reframe maladaptive thinking 7/7 days within the next 90 days.    [place]ia will increase socialization AEB engagement in community x 1 weekly within the next 90 days.   ProgressTowards Goals: Progressing  Interventions: CBT and Supportive  Summary:  Shashana Garron is a 37 y.o. female who presents with dx of major depression moderate, generalized anxiety and cannabis use. Imoni presents for session alert and oriented; mood and affect adequate; WNL. Speech clear and coherent at normal rate and tone. Engaged and receptive to interventions. [place] shares some mild improvement in mood sxs with reduction of sxs of depression and anxiety. Shares some ability to work to engage in  reframing thoughts. Shares with therapist events of son's birthday and interactions with mother. Notes ability to onto allow mother's behavior to effect her mood and events of son's birthday. Shares with therapist history of relationship with mother and not feeling as if mother thinks of her in positive light, "she doesn't approve of my looks, she can't brag about me." Shares positive event of son presenting to school and to be adjusting well despite feelings of high anxiety. Able to process with therapist working to engage in behavioral changes and working to be mindful of maladaptive thinking patterns that contribute to low mood. Shares events of engagement with co-worker in which she had plans of presenting to gym with and rperots to feel as if she handled situation well. Notes decrease in feelings of paranoia. Shares reduction in cannabis use and working to engage in Pensions consultant for moods. Denies safety concerns. Progress with goals noted; reduction in depressive sxs.   Suicidal/Homicidal: Nowithout intent/plan  Therapist Response: Therapist engaged [place] in [place] session. Assessed for current location and confidentiality of session. Completed check in and assessed for current level of stressors, sxs management and level of stressors. Provided safe space for [place] to share thoughts and feelings. Engaged [place] in exploring ability to navigate situation with mother and highlighted ability to explore alternative options of dealing with mother. Reviewed ability to reframe thinking patterns and explore alternative thoughts to support in reduction of sxs. Encouraged behavioral activation with x 1 event leading to additional positive changes. Explored use of alternative cooping skills to support in reduction of cannabis use. Reviewed session, provided follow up appointment. Assessed for safety concerns.   Plan: Return again  in  x 2 weeks.  Diagnosis: MDD (major depressive disorder), recurrent  episode, moderate (HCC)  Generalized anxiety disorder  Use of cannabis  Collaboration of Care: Other None  Patient/Guardian was advised Release of Information must be obtained prior to any record release in order to collaborate their care with an outside provider. Patient/Guardian was advised if they have not already done so to contact the registration department to sign all necessary forms in order for [place] to release information regarding their care.   Consent: Patient/Guardian gives verbal consent for treatment and assignment of benefits for services provided during this visit. Patient/Guardian expressed understanding and agreed to proceed.   [name] [place], [facility] 12/18/2022

## 2022-12-25 ENCOUNTER — Ambulatory Visit (INDEPENDENT_AMBULATORY_CARE_PROVIDER_SITE_OTHER): Payer: Medicaid Other | Admitting: Mental Health

## 2022-12-25 DIAGNOSIS — F411 Generalized anxiety disorder: Secondary | ICD-10-CM

## 2022-12-25 DIAGNOSIS — F331 Major depressive disorder, recurrent, moderate: Secondary | ICD-10-CM | POA: Diagnosis not present

## 2022-12-25 NOTE — Progress Notes (Unsigned)
THERAPIST PROGRESS NOTE Virtual Visit via Video Note  I connected with Denise Barker on 12/25/22 at 10:00 AM EST by a video enabled telemedicine application and verified that I am speaking with the correct person using two identifiers.  Location: Patient: home address on file Provider: office   I discussed the limitations of evaluation and management by telemedicine and the availability of in person appointments. The patient expressed understanding and agreed to proceed.  I discussed the assessment and treatment plan with the patient. The patient was provided an opportunity to ask questions and all were answered. The patient agreed with the plan and demonstrated an understanding of the instructions.   The patient was advised to call back or seek an in-person evaluation if the symptoms worsen or if the condition fails to improve as anticipated.  I provided 55 minutes of non-face-to-face time during this encounter.   Marion Downer, [facility]   Session Time: 10:03am (55 minutes)  Participation Level: Active  Behavioral Response: CasualAlertAdequate  Type of Therapy: Individual Therapy  Treatment Goals addressed:  Deetta will increase manage of depression/anxiety AEB development of effective coping skills with ability to reframe maladaptive thinking 7/7 days within the next 90 days.    Aubryn will increase socialization AEB engagement in community x 1 weekly within the next 90 days.   ProgressTowards Goals: Progressing  Interventions: CBT and Supportive  Summary: Alfa Giarrusso is a 37 y.o. female who presents with dx of major depression moderate, generalized anxiety and cannabis use. Brayah presents for session alert and oriented; mood and affect adequate; WNL. Speech clear and coherent at normal rate and tone. Engaged and receptive to interventions. Denise shares some mild improvement in mood sxs with reduction of sxs of depression. Shares improvement with engagement in the community and  working to engage with others in pleasant demeanor. Shares current concerns to be engagement with potential female partner and shares concerns with relationship; able to process through and identify alternative thoughts and explore evidence of distorted beliefs- "No one likes me.". Shares concerns for relationship with lack of intimacy and exploring ability to increase communication with partner for increased intimacy. Exploration of ability to increase ability to spend time with partner outside of doing things with their son. Explored options for sitters. Shares working to increase community engagement once able to secure babysitter. Shares plans for engagement with family in community upcoming weekend. Progress with goals noted; moderate improvement with with sxs at this time. No SI/HI reported.    Suicidal/Homicidal: Nowithout intent/plan  Therapist Response:  Therapist engaged Denise in [place] session. Assessed for current location and confidentiality of session. Completed check in and assessed for current level of stressors, sxs management and level of stressors. Provided safe space for Denise to share thoughts and feelings. Engaged [place] in identifying evidence for distorted/maladaptive thinking styles and supported in reframing thoughts. Explored ability to continue to work on self-esteem and self-confidence and working to find her own thoughts of self vs. What beliefs have been passed down to her from childhood and interaction with mother. Encouraged increased open and honest communication with partner to support I increased intimacy. Supported in exploring options for support with son. Reviewed session, provided follow up appointment. Assessed for safety.   Plan: Return again in  x 2 weeks.  Diagnosis: MDD (major depressive disorder), recurrent episode, moderate (HCC)  Generalized anxiety disorder  Collaboration of Care: Other None  Patient/Guardian was advised Release of Information must be  obtained prior to any record release  in order to collaborate their care with an outside provider. Patient/Guardian was advised if they have not already done so to contact the registration department to sign all necessary forms in order for [place] to release information regarding their care.   Consent: Patient/Guardian gives verbal consent for treatment and assignment of benefits for services provided during this visit. Patient/Guardian expressed understanding and agreed to proceed.   [name] [place], [facility] 12/25/2022

## 2023-01-01 ENCOUNTER — Ambulatory Visit (INDEPENDENT_AMBULATORY_CARE_PROVIDER_SITE_OTHER): Payer: Medicaid Other | Admitting: Mental Health

## 2023-01-01 DIAGNOSIS — F411 Generalized anxiety disorder: Secondary | ICD-10-CM

## 2023-01-01 DIAGNOSIS — F1290 Cannabis use, unspecified, uncomplicated: Secondary | ICD-10-CM

## 2023-01-01 DIAGNOSIS — F331 Major depressive disorder, recurrent, moderate: Secondary | ICD-10-CM | POA: Diagnosis not present
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
## 2023-01-08 ENCOUNTER — Ambulatory Visit (INDEPENDENT_AMBULATORY_CARE_PROVIDER_SITE_OTHER): Payer: Medicaid Other | Admitting: Mental Health

## 2023-01-08 DIAGNOSIS — F331 Major depressive disorder, recurrent, moderate: Secondary | ICD-10-CM | POA: Diagnosis not present

## 2023-01-08 DIAGNOSIS — F1290 Cannabis use, unspecified, uncomplicated: Secondary | ICD-10-CM

## 2023-01-08 DIAGNOSIS — F411 Generalized anxiety disorder: Secondary | ICD-10-CM

## 2023-01-08 NOTE — Progress Notes (Signed)
THERAPIST PROGRESS NOTE Virtual Visit via Video Note  I connected with [place] Mavity on 01/08/23 at 10:00 AM EDT by a video enabled telemedicine application and verified that I am speaking with the correct person using two identifiers.  Location: Patient: home address Provider: office   I discussed the limitations of evaluation and management by telemedicine and the availability of in person appointments. The patient expressed understanding and agreed to proceed.  I discussed the assessment and treatment plan with the patient. The patient was provided an opportunity to ask questions and all were answered. The patient agreed with the plan and demonstrated an understanding of the instructions.   The patient was advised to call back or seek an in-person evaluation if the symptoms worsen or if the condition fails to improve as anticipated.  I provided 53 minutes of non-face-to-face time during this encounter.   Marion Downer, [facility]   Session Time: 10:05 am ( 55 minutes)   Participation Level: Active  Behavioral Response: CasualAlertneutral  Type of Therapy: Individual Therapy  Treatment Goals addressed: Faithe will increase manage of depression/anxiety AEB development of effective coping skills with ability to reframe maladaptive thinking 7/7 days within the next 90 days.    Koi will increase socialization AEB engagement in community x 1 weekly within the next 90 days.   ProgressTowards Goals: Progressing  Interventions: CBT and Supportive  Summary: Taccara Retzer is a 37 y.o. female who presents with dx of major depression moderate, generalized anxiety and cannabis use. Satoya presents for session alert and oriented; mood and affect adequate; WNL. Speech clear and coherent. Engaged and receptive to interventions. [place] shares for moods to continue t have improvement but notes not as well as week before. Shares improvement with relationship with partner. Shares feelings related to  doing the same thing over and over and life being "mundane." Shares to dislike schedule of going to work and doing the same things over and shares to feel tired. " I am lazy." Able to explore with therapist and reframe thoughts and process current factors contributing to low energy. Able to identify working 3rd shift to be a contributor to low mood and energy at times. Shares possibility of low calcium and b12 reported by doctor to be additional factor. Able to engage with therapist and give self grace for decreased energy. Explored with therapist factors that can contribute to increase level of functioning and increased energy. Shares would like to present to the gym and is able to explore ability to make small goals for gym attendance with thoughts this will increase feelings of energy. Shares concern for working full time in having to put son in daycare for longer periods of time. " I don't want him to think mommy left." Able to acknowledge " I am projecting." Shares to often project her feelings of her past onto others and shares events of mother leaving her with family and not returning. Agrees to explore ability to present to gym. Decrease in depressive sxs noted. Engaged use of cognitive coping; difficulty reframing thoughts at times without support. Increasing ability to socialize with co-workers and developing friendships. Progress with goals, moderate improvement with sxs. No safety concerns reported.   Suicidal/Homicidal: Nowithout intent/plan  Therapist Response: Therapist engaged [place] in [place] session. Assessed for current location and confidentiality of session. Completed check in and assessed for current level of stressors, sxs management and level of stressors. Provided safe space for [place] to events of feelings tired and feelings of doing the  same things over and over. Explored factors contributing to feelings of low energy and supported in restructuring maladaptive thinking of labeling  self. Explored alternative thoughts. Explored ability to secure first shift position and barriers to this change. Encouraged her ability to give self grace and explore alternative options. Supported in working to reframe projection of her childhood onto her son. Explored desire to make small goals and take small steps to increasing activity in day with exercise. Reviewed session and provided follow up. No safety concerns reported.   Plan: Return again in  x 1 weeks.  Diagnosis: MDD (major depressive disorder), recurrent episode, moderate (HCC)  Generalized anxiety disorder  Use of cannabis  Collaboration of Care: Other None  Patient/Guardian was advised Release of Information must be obtained prior to any record release in order to collaborate their care with an outside provider. Patient/Guardian was advised if they have not already done so to contact the registration department to sign all necessary forms in order for [place] to release information regarding their care.   Consent: Patient/Guardian gives verbal consent for treatment and assignment of benefits for services provided during this visit. Patient/Guardian expressed understanding and agreed to proceed.   [name] [place], [facility] 01/08/2023

## 2023-01-15 ENCOUNTER — Encounter (HOSPITAL_COMMUNITY): Payer: Self-pay

## 2023-01-15 ENCOUNTER — Ambulatory Visit (HOSPITAL_COMMUNITY): Payer: Medicaid Other | Admitting: Mental Health

## 2023-01-22 ENCOUNTER — Ambulatory Visit (INDEPENDENT_AMBULATORY_CARE_PROVIDER_SITE_OTHER): Payer: Medicaid Other | Admitting: Mental Health

## 2023-01-22 DIAGNOSIS — F411 Generalized anxiety disorder: Secondary | ICD-10-CM

## 2023-01-22 DIAGNOSIS — F1290 Cannabis use, unspecified, uncomplicated: Secondary | ICD-10-CM

## 2023-01-22 DIAGNOSIS — F331 Major depressive disorder, recurrent, moderate: Secondary | ICD-10-CM

## 2023-01-22 DIAGNOSIS — F22 Delusional disorders: Secondary | ICD-10-CM | POA: Diagnosis not present

## 2023-01-22 NOTE — Progress Notes (Unsigned)
THERAPIST PROGRESS NOTE Virtual Visit via Video Note  I connected with [place] Pha on 01/23/23 at 10:00 AM EDT by a video enabled telemedicine application and verified that I am speaking with the correct person using two identifiers.  Location: Patient: home address on file Provider: office   I discussed the limitations of evaluation and management by telemedicine and the availability of in person appointments. The patient expressed understanding and agreed to proceed.  I discussed the assessment and treatment plan with the patient. The patient was provided an opportunity to ask questions and all were answered. The patient agreed with the plan and demonstrated an understanding of the instructions.   The patient was advised to call back or seek an in-person evaluation if the symptoms worsen or if the condition fails to improve as anticipated.  I provided 40 minutes of non-face-to-face time during this encounter.   Stephan Minister [place], [facility]   Session Time: 10: 07 am ( 40 minutes)  Participation Level: Active  Behavioral Response: CasualAlertDysphoric  Type of Therapy: Individual Therapy  Treatment Goals addressed: IOM:BTDH will increase manage of depression/anxiety AEB development of effective coping skills with ability to reframe maladaptive thinking 7/7 days within the next 90 days.   STG: [place] will increase socialization AEB engagement in community x 1 weekly within the next 90 days.     ProgressTowards Goals: Progressing  Interventions: CBT and Supportive  Summary: Fabiola Cromwell is a 37 y.o. female who presents with dx of major depression moderate, generalized anxiety and cannabis use, paranoia. Siobahn presents for session lethargic and oriented; mood and affect low.  Speech clear and coherent. Engaged and receptive to interventions. Notes to be tired due to work and working 3rd shift. [place] shares for presence of paranoid feelings surrounding upcoming solar eclipse. Notes  cncern for conspiracy therory with individuals shooting the sun and resluting in the world ending. Shares to also have thoughts of "every ne is a gay pedophile." Shares for aunt to have experienced spychotic sxs as well and shares for her to have heard voices. Notes history of seeing shadows, ghost demeans but denies for them to currently occur since starting medications. Able to engage with therapist in reality testing and working to explore evidence for paranoid thooughts. Receptive of education on psychosis and mental health concerns. Reports to have paranoid thoughts daily regardless of mood and shares for mood can be stable and enjoying self and will have intrusive paranoid thoughts of something bad happening. Shares paranoia in other areas of others talking about her as well. Able to engage with therapist and work to expore alternative thoughts and evidence of paranoid thinking. Denies safety concerns. Increase in paranoid sxs. Progress with goals with ability to reframe thoughts with support of therapist; engagement in community x 1 weekly.    Suicidal/Homicidal: Nowithout intent/plan  Therapist Response: Therapist engaged [place] in tele-therapy session. Assessed for current location and confidentiality of session. Completed check in and assessed for current level of stressors, sxs management and level of stressors. Provided safe space for [place] to share concerns in regards to feelings of distress due to paranoid thoughts. Supported in processing concerns and identified maladaptive thinking. Explored hx of paranoid thoughts and other psychotic sxs. Provided education on psychotic sxs. Supported [place] in identifying evidence the would would end with the solar eclipse and engaged in Public affairs consultant questioning. Encouraged flexibility in thinking and open to idea of paranoid thoughts to be sxs of mental health concerns. Provided education on schizoaffective disorder and explored  presence of ongoing paranoid thoughts  outside of mood episodes. Encouraged working to challenge thoughts and reframe thoughts with questioning and exploration of thoughts to be sxs. Encouraged to follow up with medication provider of ongoing nature of paranoid thinking. Encouraged rest. Reviewed session; provided follow up. Assessed for safety.   Plan: Return again in  x 4 weeks.  Diagnosis: Paranoia (psychosis)  MDD (major depressive disorder), recurrent episode, moderate  Generalized anxiety disorder  Use of cannabis  Collaboration of Care: Other None  Patient/Guardian was advised Release of Information must be obtained prior to any record release in order to collaborate their care with an outside provider. Patient/Guardian was advised if they have not already done so to contact the registration department to sign all necessary forms in order for us to release information regarding their care.   Consent: Patient/Guardian gives verbal consent for treatment and assignment of benefits for services provided during this visit. Patient/Guardian expressed understanding and agreed to proceed.   Stephan MinisterQuinlan, Jashaun Penrose [place], [facility] 01/23/2023

## 2023-01-23 DIAGNOSIS — F22 Delusional disorders: Secondary | ICD-10-CM | POA: Insufficient documentation

## 2023-01-29 ENCOUNTER — Ambulatory Visit (INDEPENDENT_AMBULATORY_CARE_PROVIDER_SITE_OTHER): Payer: Medicaid Other | Admitting: Mental Health

## 2023-01-29 DIAGNOSIS — F411 Generalized anxiety disorder: Secondary | ICD-10-CM

## 2023-01-29 DIAGNOSIS — F22 Delusional disorders: Secondary | ICD-10-CM

## 2023-01-29 DIAGNOSIS — F1290 Cannabis use, unspecified, uncomplicated: Secondary | ICD-10-CM

## 2023-01-29 DIAGNOSIS — F331 Major depressive disorder, recurrent, moderate: Secondary | ICD-10-CM | POA: Diagnosis not present

## 2023-01-29 NOTE — Progress Notes (Unsigned)
THERAPIST PROGRESS NOTE Virtual Visit via Video Note  I connected with Raychel Reth on 02/01/23 at 10:00 AMGreenlandEDT by a video enabled telemedicine application and verified that I am speaking with the correct person using two identifiers.  Location: Patient: home address on file Provider: office    I discussed the limitations of evaluation and management by telemedicine and the availability of in person appointments. The patient expressed understanding and agreed to proceed.  I discussed the assessment and treatment plan with the patient. The patient was provided an opportunity to ask questions and all were answered. The patient agreed with the plan and demonstrated an understanding of the instructions.   The patient was advised to call back or seek an in-person evaluation if the symptoms worsen or if the condition fails to improve as anticipated.  I provided 55 minutes of non-face-to-face time during this encounter.   [name], [facility]   Session Time: 10:06 am ( 55 minutes)  Participation Level: Active  Behavioral Response: CasualAlertDysphoric  Type of Therapy: Individual Therapy  Treatment Goals addressed: WUJ:[id number] will increase manage of depression/anxiety AEB development of effective coping skills with ability to reframe maladaptive thinking 7/7 days within the next 90 days.   STG: [place] will increase socialization AEB engagement in community x 1 weekly within the next 90 days.   ProgressTowards Goals: Progressing  Interventions: CBT and Supportive  Summary:  Avriel Bruck is a 37 y.o. female who presents with dx of major depression moderate, generalized anxiety and cannabis use, paranoia. Takyra presents for session lethargic and oriented; mood and affect adequate.  Speech clear and coherent. Engaged and receptive to interventions. Shares thoughts following solar eclipse and paranoid conspiracy thoughts, "Nothing happened that I know of." Shares main stressor related to  weight and feels this is main contributor to low energy and motivation. Notes upcoming appointment with bariatric doctor to explore her options. Denies desire to have surgery and would like to explore other non surgical options. Reports has reduced THC usage and is able to process ways to use thought process in reducing THC usage to making behavioral changes with relationship with food. Notes maladaptive thinking related to engagement with others and able to reframe thoughts with support of therapist. Reports feelings of anxiety and thoughts of "I am a weirdo." Denies excessive lows, ongoing concerns for worry and paranoid thinking at times. Progress with goals, sxs improvement. Denies SI/HI  Suicidal/Homicidal: Nowithout intent/plan  Therapist Response: Therapist engaged [place] in tele-therapy session. Assessed for current location and confidentiality of session. Completed check in and assessed for current level of stressors, sxs management and level of stressors. Provided safe space for [place] to shares thoughts related to stressors. Supported in working to use previous developed skills and apply to other behavioral changes in which she would like to see. Supported in identifying distorted thinking and restructuring. Reviewed session, provided follow up. Assessed for safety   Plan: Return again in my x 1 weeks.  Diagnosis: MDD (major depressive disorder), recurrent episode, moderate  Generalized anxiety disorder  Use of cannabis  Paranoia (psychosis)  Collaboration of Care: Other None  Patient/Guardian was advised Release of Information must be obtained prior to any record release in order to collaborate their care with an outside provider. Patient/Guardian was advised if they have not already done so to contact the registration department to sign all necessary forms in order for [place] to release information regarding their care.   Consent: Patient/Guardian gives verbal consent for treatment and  assignment of benefits for services provided during this visit. Patient/Guardian expressed understanding and agreed to proceed.   Stephan Minister [place], [facility] 02/01/2023

## 2023-02-05 ENCOUNTER — Encounter (HOSPITAL_COMMUNITY): Payer: Self-pay

## 2023-02-05 ENCOUNTER — Ambulatory Visit (HOSPITAL_COMMUNITY): Payer: No Payment, Other | Admitting: Mental Health

## 2023-02-12 ENCOUNTER — Ambulatory Visit (INDEPENDENT_AMBULATORY_CARE_PROVIDER_SITE_OTHER): Payer: Medicaid Other | Admitting: Mental Health

## 2023-02-12 DIAGNOSIS — F331 Major depressive disorder, recurrent, moderate: Secondary | ICD-10-CM | POA: Diagnosis not present

## 2023-02-12 DIAGNOSIS — F22 Delusional disorders: Secondary | ICD-10-CM

## 2023-02-12 DIAGNOSIS — F411 Generalized anxiety disorder: Secondary | ICD-10-CM

## 2023-02-12 NOTE — Progress Notes (Signed)
THERAPIST PROGRESS NOTE Virtual Visit via Video Note  I connected with Denise Barker on 02/15/23 at  9:00 AM EDT by a video enabled telemedicine application and verified that I am speaking with the correct person using two identifiers.  Location: Patient: home address on file Provider: office   I discussed the limitations of evaluation and management by telemedicine and the availability of in person appointments. The patient expressed understanding and agreed to proceed.  I discussed the assessment and treatment plan with the patient. The patient was provided an opportunity to ask questions and all were answered. The patient agreed with the plan and demonstrated an understanding of the instructions.   The patient was advised to call back or seek an in-person evaluation if the symptoms worsen or if the condition fails to improve as anticipated.  I provided 55 minutes of non-face-to-face time during this encounter.   [name], [facility]   Session Time: 9:05 am (55 minutes)  Participation Level: Active  Behavioral Response: CasualAlertDysphoric  Type of Therapy: Individual Therapy  Treatment Goals addressed:  ZOX:WRUE will increase manage of depression/anxiety AEB development of effective coping skills with ability to reframe maladaptive thinking 7/7 days within the next 90 days.    STG: Denise will increase socialization AEB engagement in community x 1 weekly within the next 90 days.   ProgressTowards Goals: Progressing  Interventions: CBT and Supportive  Summary: Denise Barker is a 37 y.o. female who presents with dx of major depression moderate, generalized anxiety and cannabis use, paranoia. Artisha presents for session lethargic and oriented; mood and affect adequate.  Speech clear and coherent. Engaged and receptive to interventions. Shares for moods to have been largely stable. Notes current concern for feelings of insecurity and anxiety around females in which she is  interested in. Notes for this to result in her dress and share hx of way in which mother would speak to her in regards to her clothing with mother making negative comments about her clothing. Shares hx of feelings of insecurity. Able to process with clinician working to increase self-confidence and esteem of self. Shares additional factor in anxiety and insecurity to be her weight. Shares behavioral changes she is planning to engage in to support in increased healthy diet. Notes has been able to increase attendance to community with attending events at least x 1 weekly. Shares working to manage feelings of depression and working to reframe thoughts at more consistent rate. Shares plans to go shopping and explore her sense of identity with personal style. Able to identify things in which she likes about herself and evidence that others enjoy her for her. Shares interaction with female at work and notes for her to have made her uncomfortable, shares ability to work to put in place boundaries. Denies SI/HI   Suicidal/Homicidal: Nowithout intent/plan  Therapist Response: Therapist engaged Denise in tele-therapy session. Assessed for current location and confidentiality of session. Completed check in and assessed for current level of stressors, sxs management and level of stressors. Provided safe space for Denise to shares thoughts and feelings related to insecurities with self and clothing. Provided support and encouragement; validated feelings. Engaged in guided discovery to explore alternative thoughts and explore evidence of ability to comfortable and confident in self. Reviewed ability to set appropriate boundaries with others. Encouraged engagement in positive self talk and to be mindful of internal dialogue. Reviewed session and provided follow up. No safety concerns reported.   Plan: Return again in  x 4 weeks.  Diagnosis: MDD (major depressive disorder), recurrent episode, moderate (HCC)  Generalized  anxiety disorder  Paranoia (psychosis) (HCC)  Collaboration of Care: Other None  Patient/Guardian was advised Release of Information must be obtained prior to any record release in order to collaborate their care with an outside provider. Patient/Guardian was advised if they have not already done so to contact the registration department to sign all necessary forms in order for [place] to release information regarding their care.   Consent: Patient/Guardian gives verbal consent for treatment and assignment of benefits for services provided during this visit. Patient/Guardian expressed understanding and agreed to proceed.   Stephan Minister [place], [facility] 02/15/2023
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
## 2023-02-17 ENCOUNTER — Encounter (HOSPITAL_COMMUNITY): Payer: Self-pay | Admitting: Psychiatry

## 2023-02-17 ENCOUNTER — Telehealth (INDEPENDENT_AMBULATORY_CARE_PROVIDER_SITE_OTHER): Payer: Medicaid Other | Admitting: Psychiatry

## 2023-02-17 DIAGNOSIS — F22 Delusional disorders: Secondary | ICD-10-CM | POA: Diagnosis not present

## 2023-02-17 DIAGNOSIS — F331 Major depressive disorder, recurrent, moderate: Secondary | ICD-10-CM | POA: Diagnosis not present

## 2023-02-17 DIAGNOSIS — F1290 Cannabis use, unspecified, uncomplicated: Secondary | ICD-10-CM

## 2023-02-17 DIAGNOSIS — F439 Reaction to severe stress, unspecified: Secondary | ICD-10-CM

## 2023-02-17 MED ORDER — ARIPIPRAZOLE 10 MG PO TABS: 10.0000 mg | ORAL_TABLET | Freq: Every day | ORAL | 2 refills | Status: DC

## 2023-02-17 MED ORDER — SERTRALINE HCL 50 MG PO TABS: 50.0000 mg | ORAL_TABLET | Freq: Every day | ORAL | 2 refills | Status: DC

## 2023-02-17 NOTE — Patient Instructions (Signed)
Thank you for attending your appointment today.  -- We did not make any medication changes today. Please continue medications as prescribed.  Please do not make any changes to medications without first discussing with your provider. If you are experiencing a psychiatric emergency, please call 911 or present to your nearest emergency department. Additional crisis, medication management, and therapy resources are included below.  [facility]  [address], [place], [place] [address] [phone] WALK-IN URGENT CARE 24/7 FOR ANYONE [address], [place], [place]  [phone] Fax: [phone] [email or web address] *Interpreters available *Accepts all insurance and uninsured for Urgent Care needs *Accepts Medicaid and uninsured for outpatient treatment (below)      ONLY FOR [place] Residents  Below:    Outpatient New Patient Assessment/Therapy Walk-ins:        Monday -Thursday 8am until slots are full.        Every Friday 1pm-4pm  (first come, first served)                   New Patient Psychiatry/Medication Management        Monday-Friday 8am-11am (first come, first served)               For all walk-ins we ask that you arrive by 7:15am, because patients will be seen in the order of arrival.   

## 2023-03-12 ENCOUNTER — Ambulatory Visit (INDEPENDENT_AMBULATORY_CARE_PROVIDER_SITE_OTHER): Payer: Medicaid Other | Admitting: Mental Health

## 2023-03-12 DIAGNOSIS — F331 Major depressive disorder, recurrent, moderate: Secondary | ICD-10-CM | POA: Diagnosis not present

## 2023-03-12 DIAGNOSIS — F411 Generalized anxiety disorder: Secondary | ICD-10-CM

## 2023-03-12 DIAGNOSIS — F22 Delusional disorders: Secondary | ICD-10-CM

## 2023-03-12 NOTE — Progress Notes (Signed)
THERAPIST PROGRESS NOTE Virtual Visit via Video Note  I connected with Denise Barker on 03/12/23 at  9:00 AM EDT by a video enabled telemedicine application and verified that I am speaking with the correct person using two identifiers.  Location: Patient: parking lot dermatologist  Provider: home office    I discussed the limitations of evaluation and management by telemedicine and the availability of in person appointments. The patient expressed understanding and agreed to proceed.  I discussed the assessment and treatment plan with the patient. The patient was provided an opportunity to ask questions and all were answered. The patient agreed with the plan and demonstrated an understanding of the instructions.   The patient was advised to call back or seek an in-person evaluation if the symptoms worsen or if the condition fails to improve as anticipated.  I provided of non-face-to-face time during this encounter.   [name], [facility]   Session Time: 9:03 am ( 30 minutes)   Participation Level: Active  Behavioral Response: CasualAlertAdequate  Type of Therapy: Individual Therapy  Treatment Goals addressed: WUJ:WJXB will increase manage of depression/anxiety AEB development of effective coping skills with ability to reframe maladaptive thinking 7/7 days within the next 90 days.    STG: Denise will increase socialization AEB engagement in community x 1 weekly within the next 90 days.   ProgressTowards Goals: Progressing  Interventions: CBT and Supportive  Summary:  Denise Barker is a 37 y.o. female who presents with dx of major depression moderate, generalized anxiety and cannabis use, paranoia. Annitta presents for session alert and oriented; mood and affect adequate.  Speech clear and coherent. Shares for moods to have been stable; "good now." Shares events of ending relationship with female and has been able to process events of ending relationship. Engaged with  therapist to explore feelings of self-esteem and working to not over inflate others importance and working to manage emotions. Notes ability to maintain good boundaries at work. Shares engaging with friendships and presenting in the community to events.  Denies concerns for depression at this time. Reports need to end session prematurely due to another appointment scheduled. Denies safety concerns. Progress on goals and ability to reframe thoughts at increased rate; increased socialization with attendance to community at least x 1 weekly. Denies SI/HI   Suicidal/Homicidal: Nowithout intent/plan  Therapist Response:  Therapist engaged Denise in tele-therapy session. Assessed for current location and confidentiality of session. Completed check in and assessed for current level of stressors, sxs management and level of stressors. Provided support and encouragement; validated feelings. Supported Denise in processing events with recent ending of female relationship. Explored her disposition and engagement with female partners and working to hold emotional boundaries in place and explored thoughts of self-validation and self-esteem. Assessed for concerns for depression and anxiety. Encouraged ongoing progress with goals. Reviewed session and provided follow up. No safety concerns reported.   Plan: Return again in x 4 weeks.  Diagnosis: MDD (major depressive disorder), recurrent episode, moderate (HCC)  Generalized anxiety disorder  Paranoia (psychosis) (HCC)  Collaboration of Care: Other None  Patient/Guardian was advised Release of Information must be obtained prior to any record release in order to collaborate their care with an outside provider. Patient/Guardian was advised if they have not already done so to contact the registration department to sign all necessary forms in order for [place] to release information regarding their care.   Consent: Patient/Guardian gives verbal consent for treatment and  assignment of benefits for services  provided during this visit. Patient/Guardian expressed understanding and agreed to proceed.   Stephan Minister [place], [facility] 03/12/2023

## 2023-03-26 ENCOUNTER — Ambulatory Visit (INDEPENDENT_AMBULATORY_CARE_PROVIDER_SITE_OTHER): Payer: Medicaid Other | Admitting: Mental Health

## 2023-03-26 DIAGNOSIS — F411 Generalized anxiety disorder: Secondary | ICD-10-CM

## 2023-03-26 DIAGNOSIS — F331 Major depressive disorder, recurrent, moderate: Secondary | ICD-10-CM | POA: Diagnosis not present

## 2023-03-26 DIAGNOSIS — F22 Delusional disorders: Secondary | ICD-10-CM

## 2023-03-26 NOTE — Progress Notes (Unsigned)
THERAPIST PROGRESS NOTE Virtual Visit via Video Note  I connected with Denise Barker on 03/26/23 at  9:00 AM EDT by a video enabled telemedicine application and verified that I am speaking with the correct person using two identifiers.  Location: Patient: home address on file  Provider: home office    I discussed the limitations of evaluation and management by telemedicine and the availability of in person appointments. The patient expressed understanding and agreed to proceed.  I discussed the assessment and treatment plan with the patient. The patient was provided an opportunity to ask questions and all were answered. The patient agreed with the plan and demonstrated an understanding of the instructions.   The patient was advised to call back or seek an in-person evaluation if the symptoms worsen or if the condition fails to improve as anticipated.  I provided 54 minutes of non-face-to-face time during this encounter.   Denise Barker, [facility]   Session Time: 9:06 am ( 54 minutes0  Participation Level: Active  Behavioral Response: CasualAlertAdequate  Type of Therapy: Individual Therapy  Treatment Goals addressed: WUJ:WJXB will increase manage of depression/anxiety AEB development of effective coping skills with ability to reframe maladaptive thinking 7/7 days within the next 90 days.   STG: Denise will increase socialization AEB engagement in community x 1 weekly within the next 90 days.    ProgressTowards Goals: Progressing  Interventions: CBT and Supportive  Summary:Denise Barker is a 37 y.o. female who presents with dx of major depression moderate, generalized anxiety and cannabis use, paranoia. Denise Barker presents for session alert and oriented; mood and affect adequate.  Speech clear and coherent. Shares for moods to have been stable; denies concerns for depression at this time. Shares has been engaging in community and interacting with others. Shares with therapist interactions  with co-worker and spending time outside of work with her. Notes concerns and red flags and able to process with therapist with plan to put in place boundaries and be mindful of interactions with her. Notes improvement in distorted thinking of personalization and shares to no longer blame self automatically for things and able to reframe thoughts. Shares feelings of guilt in regards to engagement at home with level of cooking and taking care of son but denies for partner to have cause of issues when communicating on subject. Continues to report concern for self-esteem and confidence with weight being biggest factor. Ongoing improvement with goals; stability in sxs. Denies SI/HI   Suicidal/Homicidal: Nowithout intent/plan  Therapist Response: Therapist engaged Denise in tele-therapy session. Assessed for current location and confidentiality of session. Completed check in and assessed for current level of stressors, sxs management and level of functioning. Assessed for level of depression. Supported Denise in processing through interactions with others and working to set appropriate boundaries and types and levels of relationship with others. Explored ability to reframe thoughts with distorted cognitions present. Explored core belief surrounding belief of self. Reviewed session and provided follow up. No safety concerns reported.   Plan: Return again in mx 4 weeks.  Diagnosis: MDD (major depressive disorder), recurrent episode, moderate (HCC)  Generalized anxiety disorder  Paranoia (psychosis) (HCC)  Collaboration of Care: Other None  Patient/Guardian was advised Release of Information must be obtained prior to any record release in order to collaborate their care with an outside provider. Patient/Guardian was advised if they have not already done so to contact the registration department to sign all necessary forms in order for [place] to release information regarding their care.  Consent: Patient/Guardian  gives verbal consent for treatment and assignment of benefits for services provided during this visit. Patient/Guardian expressed understanding and agreed to proceed.   Denise Barker, [facility] 03/26/2023

## 2023-04-09 ENCOUNTER — Ambulatory Visit (INDEPENDENT_AMBULATORY_CARE_PROVIDER_SITE_OTHER): Payer: Medicaid Other | Admitting: Mental Health

## 2023-04-09 DIAGNOSIS — F411 Generalized anxiety disorder: Secondary | ICD-10-CM

## 2023-04-09 DIAGNOSIS — F331 Major depressive disorder, recurrent, moderate: Secondary | ICD-10-CM

## 2023-04-09 DIAGNOSIS — F22 Delusional disorders: Secondary | ICD-10-CM

## 2023-04-09 NOTE — Progress Notes (Unsigned)
THERAPIST PROGRESS NOTE Virtual Visit via Video Note  I connected with Denise Barker on 04/09/23 at  9:00 AM EDT by a video enabled telemedicine application and verified that I am speaking with the correct person using two identifiers.  Location: Patient: home address on file Provider: office   I discussed the limitations of evaluation and management by telemedicine and the availability of in person appointments. The patient expressed understanding and agreed to proceed.  I discussed the assessment and treatment plan with the patient. The patient was provided an opportunity to ask questions and all were answered. The patient agreed with the plan and demonstrated an understanding of the instructions.   The patient was advised to call back or seek an in-person evaluation if the symptoms worsen or if the condition fails to improve as anticipated.  I provided 53 minutes of non-face-to-face time during this encounter.   [name], [facility]   Session Time: 9:06 am (53 minutes)  Participation Level: Active  Behavioral Response: CasualAlertadequate  Type of Therapy: Individual Therapy  Treatment Goals addressed: WUX:LKGM will increase manage of depression/anxiety AEB development of effective coping skills with ability to reframe maladaptive thinking 7/7 days within the next 90 days.    STG: Denise will increase socialization AEB engagement in community x 1 weekly within the next 90 days.   ProgressTowards Goals: Progressing  Interventions: CBT and Supportive  Summary: Denise Barker is a 37 y.o. female who presents with dx of major depression moderate, generalized anxiety and cannabis use, paranoia. Denise Barker presents for session alert and oriented; mood and affect adequate.  Speech clear and coherent. Shares for moods to have been flat and shares increase in paranoia feelings. Reports to have been off medications for x 5 days and in need of picking of from pharmacy. Shares thoughts on  conspiracies and concern for animals to be beaching themself in ocean. Notes ongoing engagement in community and socialization with others. Shares ability to navigating getting to know others and managing feelings of anxiety related to friendships. Shares desire to re-engage with previous friendship and explores pros and cons of doing so as well as hx of that relationship. Shares feelings of low mood and depression continue to be related to weight and shares has lost some weight, unclear as to why. Explored with therapist barriers of working out and desire to go for walk. Explored small behavior changes to support in walking in neighborhood and able to make goal to walk over the weekend. Shares feelings of anxiety related to this new behavior and shares intrinsic desire for change. Denies SI/HI. Progress with goals; some increase in sxs secondary to lack of medications. Notes ability to obtain. No safety concerns reported.   Suicidal/Homicidal: Nowithout intent/plan  Therapist Response: Therapist engaged Denise in tele-therapy session. Assessed for current location and confidentiality of session. Completed check in and assessed for current level of stressors, sxs management and level of functioning. Assessed for level of depression. Supported Denise in processing through navigating friendships and engagement in the community with others. Explored feelings of depression and anxiety and factors contributing. Identified barriers in making progress on desired weight loss goals and supported in identifying SMART goal and assessed level of desire for engaging in goal. Explored small behavioral changes to lead up to bigger goals. Reviewed session provided follow up and assessed for safety.   Plan: Return again in x 4 weeks.  Diagnosis: MDD (major depressive disorder), recurrent episode, moderate (HCC)  Generalized anxiety disorder  Paranoia (psychosis) (  HCC)  Collaboration of Care: Other None  Patient/Guardian  was advised Release of Information must be obtained prior to any record release in order to collaborate their care with an outside provider. Patient/Guardian was advised if they have not already done so to contact the registration department to sign all necessary forms in order for [place] to release information regarding their care.   Consent: Patient/Guardian gives verbal consent for treatment and assignment of benefits for services provided during this visit. Patient/Guardian expressed understanding and agreed to proceed.   Stephan Minister [place], [facility] 04/09/2023
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
## 2023-04-14 ENCOUNTER — Telehealth (INDEPENDENT_AMBULATORY_CARE_PROVIDER_SITE_OTHER): Payer: Medicaid Other | Admitting: Psychiatry

## 2023-04-14 ENCOUNTER — Encounter (HOSPITAL_COMMUNITY): Payer: Self-pay | Admitting: Psychiatry

## 2023-04-14 ENCOUNTER — Other Ambulatory Visit: Payer: Self-pay

## 2023-04-14 DIAGNOSIS — F22 Delusional disorders: Secondary | ICD-10-CM

## 2023-04-14 DIAGNOSIS — F331 Major depressive disorder, recurrent, moderate: Secondary | ICD-10-CM | POA: Diagnosis not present

## 2023-04-14 DIAGNOSIS — F1290 Cannabis use, unspecified, uncomplicated: Secondary | ICD-10-CM | POA: Diagnosis not present

## 2023-04-14 DIAGNOSIS — F4310 Post-traumatic stress disorder, unspecified: Secondary | ICD-10-CM

## 2023-04-14 MED ORDER — ARIPIPRAZOLE 10 MG PO TABS
10.0000 mg | ORAL_TABLET | Freq: Every morning | ORAL | 2 refills | Status: DC
  Filled 2023-04-14: qty 30, 30d supply, fill #0

## 2023-04-14 MED ORDER — PRAZOSIN HCL 1 MG PO CAPS
1.0000 mg | ORAL_CAPSULE | Freq: Every day | ORAL | 2 refills | Status: DC
  Filled 2023-04-14: qty 30, 30d supply, fill #0

## 2023-04-14 MED ORDER — SERTRALINE HCL 100 MG PO TABS
ORAL_TABLET | ORAL | 2 refills | Status: DC
  Filled 2023-04-14: qty 30, 33d supply, fill #0

## 2023-04-14 NOTE — Patient Instructions (Signed)
Thank you for attending your appointment today.  -- INCREASE Zoloft to 100 mg daily -- Continue Abilify 10 mg daily -- START Prazosin 1 mg nightly before bedtime -- Continue other medications as prescribed.  Please do not make any changes to medications without first discussing with your provider. If you are experiencing a psychiatric emergency, please call 911 or present to your nearest emergency department. Additional crisis, medication management, and therapy resources are included below.  [facility]  [address], [place], [place] [address] [phone] WALK-IN URGENT CARE 24/7 FOR ANYONE [address] [place], [place]  [phone] Fax: [phone] [email or web address] *Interpreters available *Accepts all insurance and uninsured for Urgent Care needs *Accepts Medicaid and uninsured for outpatient treatment (below)      ONLY FOR [facility]  Below:    Outpatient New Patient Assessment/Therapy Walk-ins:        Monday -Thursday 8am until slots are full.        Every Friday 1pm-4pm  (first come, first served)                   New Patient Psychiatry/Medication Management        Monday-Friday 8am-11am (first come, first served)               For all walk-ins we ask that you arrive by 7:15am, because patients will be seen in the order of arrival.

## 2023-04-16 ENCOUNTER — Other Ambulatory Visit: Payer: Self-pay

## 2023-04-30 ENCOUNTER — Ambulatory Visit (HOSPITAL_COMMUNITY): Payer: MEDICAID | Admitting: Mental Health

## 2023-04-30 DIAGNOSIS — F331 Major depressive disorder, recurrent, moderate: Secondary | ICD-10-CM

## 2023-04-30 DIAGNOSIS — F411 Generalized anxiety disorder: Secondary | ICD-10-CM

## 2023-04-30 DIAGNOSIS — F22 Delusional disorders: Secondary | ICD-10-CM

## 2023-04-30 NOTE — Progress Notes (Signed)
THERAPIST PROGRESS NOTE Virtual Visit via Video Note  I connected with Denise Barker on 04/30/23 at  9:00 AM EDT by a video enabled telemedicine application and verified that I am speaking with the correct person using two identifiers.  Location: Patient: home address on file Provider: home office   I discussed the limitations of evaluation and management by telemedicine and the availability of in person appointments. The patient expressed understanding and agreed to proceed.  I discussed the assessment and treatment plan with the patient. The patient was provided an opportunity to ask questions and all were answered. The patient agreed with the plan and demonstrated an understanding of the instructions.   The patient was advised to call back or seek an in-person evaluation if the symptoms worsen or if the condition fails to improve as anticipated.  I provided 55 minutes of non-face-to-face time during this encounter.   Denise Barker, [facility]   Session Time: 9:02 am ( 55 minutes)  Participation Level: Active  Behavioral Response: CasualAlertDysphoric  Type of Therapy: Individual Therapy  Treatment Goals addressed:  ZOX:WRUE will increase manage of depression/anxiety AEB development of effective coping skills with ability to reframe maladaptive thinking 7/7 days within the next 90 days.    STG: Denise will increase socialization AEB engagement in community x 1 weekly within the next 90 days.  ProgressTowards Goals: Progressing  Interventions: CBT and Supportive  Summary:  Denise Barker is a 37 y.o. female who presents with dx of major depression moderate, generalized anxiety and cannabis use, paranoia. Denise Barker presents for session alert and oriented; mood and affect adequate.  Speech clear and coherent. Shares for moods to have been stable and reports medication compliance. Shares feelings of anxiety in regards to son and development; shares improvements she has also noticed and  explored working to control anxieties around son's diagnoses. Shares anxieties and paranoia around [employer] and working to manage by not seeking out information. Engaged with therapist and processes engagement socially and ability to trust self and engaging in appropriate disclosure with others. Shares concerns for being on on the cusp of diabetes and notes plan to engage with dietician/ nutritionist. Shares ongoing lethargy, low motivation, low energy with poor sleeping habits. Explores working to increase amount of sleep she is able to obtain. Denies SI/HI. Progress with goals; coping skills for anxiety and ability to reframe thoughts independently.   Suicidal/Homicidal: Nowithout intent/plan  Therapist Response: Therapist engaged Denise in tele-therapy session. Assessed for current location and confidentiality of session. Completed check in and assessed for current level of stressors, sxs management and level of stressors. Supported in processing current concerns in regards to anxiety and provided supportive feedback. Supported in restructuring thoughts around anxieties of gov't, her son and engagement with social relationships. Explored ability to hold self-trust and identified concerns for self-disclosure with others. Explored working to increase awareness of habit of doing "deep dives" and exacerbating sxs of anxiety and paranoia. Encouraged working to obtain adequate, uninterrupted sleep. Reviewed session, provided follow up. No safety concerns reported.   Plan: Return again in  x 3 weeks.  Diagnosis: MDD (major depressive disorder), recurrent episode, moderate (HCC)  Generalized anxiety disorder  Paranoia (psychosis) (HCC)  Collaboration of Care: Other None  Patient/Guardian was advised Release of Information must be obtained prior to any record release in order to collaborate their care with an outside provider. Patient/Guardian was advised if they have not already done so to contact the  registration department to sign all necessary  forms in order for [place] to release information regarding their care.   Consent: Patient/Guardian gives verbal consent for treatment and assignment of benefits for services provided during this visit. Patient/Guardian expressed understanding and agreed to proceed.   Stephan Minister [place], [facility] 04/30/2023

## 2023-05-21 ENCOUNTER — Ambulatory Visit (INDEPENDENT_AMBULATORY_CARE_PROVIDER_SITE_OTHER): Payer: No Payment, Other | Admitting: Mental Health

## 2023-05-21 DIAGNOSIS — F22 Delusional disorders: Secondary | ICD-10-CM

## 2023-05-21 DIAGNOSIS — F331 Major depressive disorder, recurrent, moderate: Secondary | ICD-10-CM | POA: Diagnosis not present

## 2023-05-21 DIAGNOSIS — F411 Generalized anxiety disorder: Secondary | ICD-10-CM

## 2023-05-21 NOTE — Progress Notes (Unsigned)
THERAPIST PROGRESS NOTE Virtual Visit via Video Note  I connected with Denise Barker on 05/21/23 at  9:00 AM EDT by a video enabled telemedicine application and verified that I am speaking with the correct person using two identifiers.  Location: Patient: home address on file  Provider: home office    I discussed the limitations of evaluation and management by telemedicine and the availability of in person appointments. The patient expressed understanding and agreed to proceed.  I discussed the assessment and treatment plan with the patient. The patient was provided an opportunity to ask questions and all were answered. The patient agreed with the plan and demonstrated an understanding of the instructions.   The patient was advised to call back or seek an in-person evaluation if the symptoms worsen or if the condition fails to improve as anticipated.  I provided 54 minutes of non-face-to-face time during this encounter.   Denise Barker, Denise Barker   Session Time: 9:05 am ( 54 minutes)  Participation Level: Active  Behavioral Response: CasualAlertEuthymic  Type of Therapy: Individual Therapy  Treatment Goals addressed: XLK:GMWN will increase manage of depression/anxiety AEB development of effective coping skills with ability to reframe maladaptive thinking 7/7 days within the next 90 days.    STG: Denise will increase socialization AEB engagement in community x 1 weekly within the next 90 days.   ProgressTowards Goals: Progressing  Interventions: CBT and Supportive  Summary: Denise Barker is a 37 y.o. female who presents with dx of major depression moderate, generalized anxiety and cannabis use, paranoia. Allexis presents for session alert and oriented; mood and affect adequate.  Speech clear and coherent at normal rate and tone. Engaged and receptive to interventions. Shares for moods to be stable and improvement in paranoia sxs. Shares plans to present home for x 1 week and shares in  regards to concerns with engagement with family and comments about her son and hx autism dx. Notes of at times inappropriate comments and difficulty communicating with family about son. Shares concerns for engagement with old friendship and explores desire to engage. Processes thoughts and explored ways to communicate and advocate for son and offer education on Autism dx. Notes for family to be very defensive and does not receive criticism well. Shares changes made with diet and making improvements with physical health. Increased balanced thoughts and use of coping skills and ability to reframe thoughts as needed 7/7 days. Shares ongoing engagement in community and increasing socialization. Progress with goals; no concerns reported. Sxs stable at this time. Denies safety concerns.    Suicidal/Homicidal: Nowithout intent/plan  Therapist Response: Therapist engaged Denise in tele-therapy session. Assessed for current location and confidentiality of session. Completed check in and assessed for current level of stressors, sxs management and level of stressors. Supported in processing current concerns in regards to anxiety with presentation home. Provided support and encouragement; validated feelings. Engaged [name] in exploring ability to offer gentle correction and education in regards to her child and his Autism dx. Engaged in role playing and possible responses of engagement with others. Reviewed factors that are supporting in increase in mood and decrease in depression and anxiety. Reviewed session, provided follow up. No safety concerns reported.   Plan: Return again in  x 4 weeks.  Diagnosis: MDD (major depressive disorder), recurrent episode, moderate (HCC)  Generalized anxiety disorder  Paranoia (psychosis) (HCC)  Collaboration of Care: Other None  Patient/Guardian was advised Release of Information must be obtained prior to any record release in order  to collaborate their care with an outside  provider. Patient/Guardian was advised if they have not already done so to contact the registration department to sign all necessary forms in order for [place] to release information regarding their care.   Consent: Patient/Guardian gives verbal consent for treatment and assignment of benefits for services provided during this visit. Patient/Guardian expressed understanding and agreed to proceed.   Stephan Minister Cameron, [facility] 05/21/2023

## 2023-06-04 ENCOUNTER — Ambulatory Visit (INDEPENDENT_AMBULATORY_CARE_PROVIDER_SITE_OTHER): Payer: No Payment, Other | Admitting: Mental Health

## 2023-06-04 DIAGNOSIS — F331 Major depressive disorder, recurrent, moderate: Secondary | ICD-10-CM

## 2023-06-04 DIAGNOSIS — F22 Delusional disorders: Secondary | ICD-10-CM

## 2023-06-04 DIAGNOSIS — F1290 Cannabis use, unspecified, uncomplicated: Secondary | ICD-10-CM | POA: Diagnosis not present

## 2023-06-04 DIAGNOSIS — F411 Generalized anxiety disorder: Secondary | ICD-10-CM

## 2023-06-04 NOTE — Progress Notes (Signed)
THERAPIST PROGRESS NOTE Virtual Visit via Video Note  I connected with Denise Barker on 06/04/23 at  9:00 AM EDT by a video enabled telemedicine application and verified that I am speaking with the correct person using two identifiers.  Location: Patient: home address onfile Provider: office   I discussed the limitations of evaluation and management by telemedicine and the availability of in person appointments. The patient expressed understanding and agreed to proceed.  I discussed the assessment and treatment plan with the patient. The patient was provided an opportunity to ask questions and all were answered. The patient agreed with the plan and demonstrated an understanding of the instructions.   The patient was advised to call back or seek an in-person evaluation if the symptoms worsen or if the condition fails to improve as anticipated.  I provided 53 minutes of non-face-to-face time during this encounter.   Denise Barker, [facility]   Session Time: 9:04 am ( 53 minutes)   Participation Level: Active  Behavioral Response: CasualAlertEuthymic  Type of Therapy: Individual Therapy  Treatment Goals addressed: VHQ:IONG will increase manage of depression/anxiety AEB development of effective coping skills with ability to reframe maladaptive thinking 7/7 days within the next 90 days.    STG: Denise will increase socialization AEB engagement in community x 1 weekly within the next 90 days.   ProgressTowards Goals: Progressing  Interventions: CBT and Supportive  Summary: Denise Barker is a 37 y.o. female who presents with dx of major depression moderate, generalized anxiety and cannabis use, paranoia. Denise Barker presents for session alert and oriented; mood and affect adequate.  Speech clear and coherent at normal rate and tone. Engaged and receptive to interventions. Shares for moods to be stable and denies feelings or sxs of depression. Shares sxs of anxiety prior to presenting to work and  some paranoia. Shares with therapist events of presenting home for visit and shares for this to have gone well. Notes situation at work with previous friend and hx of high anxiety prior to presenting to work, notes for this to have reduced but continues to experience gastro sxs prior to presenting. Notes concern for weight and how she looks in uniform. Shares has lost weight and open to presenting to gym with a friend. Shares works to cope with paranoia with not feeding into paranoid thoughts and engaging in thought stopping. Reports to be getting out in the community at least x 1 weekly and engagement in healthy balanced thoughts. Denies SI/HI  Suicidal/Homicidal: Nowithout intent/plan  Therapist Response: Therapist engaged Denise in tele-therapy session. Assessed for current location and confidentiality of session. Completed check in and assessed for current level of stressors, sxs management and level of stressors. Supported in processing current concerns in regards to anxiety with presentation work and explored levels. Explored cognitions related to anxiety. Processed ongoing behavioral changes to support in desire to loose weight and encouraged ongoing work towards increasing physical activity and healthy eating habits. Assessed for paranoias and working to engage in balanced thinking and ability to challenge unhelpful thinking patterns. Encouraged ongoing engagement in community and working to engage in balanced daily life.   Plan: Return again in  x 4 weeks.  Diagnosis: MDD (major depressive disorder), recurrent episode, moderate (HCC)  Paranoia (psychosis) (HCC)  Generalized anxiety disorder  Cannabis use disorder  Collaboration of Care: Other None  Patient/Guardian was advised Release of Information must be obtained prior to any record release in order to collaborate their care with an outside provider. Patient/Guardian was  advised if they have not already done so to contact the registration  department to sign all necessary forms in order for [place] to release information regarding their care.   Consent: Patient/Guardian gives verbal consent for treatment and assignment of benefits for services provided during this visit. Patient/Guardian expressed understanding and agreed to proceed.   Denise Barker Denise Barker, [facility] 06/04/2023
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
## 2023-06-16 ENCOUNTER — Encounter (HOSPITAL_COMMUNITY): Payer: Self-pay | Admitting: Psychiatry

## 2023-06-16 ENCOUNTER — Other Ambulatory Visit: Payer: Self-pay

## 2023-06-16 ENCOUNTER — Telehealth (INDEPENDENT_AMBULATORY_CARE_PROVIDER_SITE_OTHER): Payer: No Payment, Other | Admitting: Psychiatry

## 2023-06-16 DIAGNOSIS — F331 Major depressive disorder, recurrent, moderate: Secondary | ICD-10-CM

## 2023-06-16 DIAGNOSIS — F1290 Cannabis use, unspecified, uncomplicated: Secondary | ICD-10-CM | POA: Diagnosis not present

## 2023-06-16 DIAGNOSIS — F411 Generalized anxiety disorder: Secondary | ICD-10-CM

## 2023-06-16 DIAGNOSIS — F4310 Post-traumatic stress disorder, unspecified: Secondary | ICD-10-CM | POA: Diagnosis not present

## 2023-06-16 DIAGNOSIS — F22 Delusional disorders: Secondary | ICD-10-CM | POA: Diagnosis not present

## 2023-06-16 MED ORDER — SERTRALINE HCL 50 MG PO TABS
50.0000 mg | ORAL_TABLET | Freq: Every day | ORAL | 2 refills | Status: DC
  Filled 2023-06-16 (×2): qty 30, 30d supply, fill #0

## 2023-06-16 NOTE — Patient Instructions (Signed)
Thank you for attending your appointment today.  -- RESTART Zoloft 50 mg daily -- Continue other medications as prescribed.  Please do not make any changes to medications without first discussing with your provider. If you are experiencing a psychiatric emergency, please call 911 or present to your nearest emergency department. Additional crisis, medication management, and therapy resources are included below.  [facility]  [address] [place], [place] [address] [phone] WALK-IN URGENT CARE 24/7 FOR ANYONE [address], [place], [place]  [phone] Fax: [phone] [email or web address] *Interpreters available *Accepts all insurance and uninsured for Urgent Care needs *Accepts Medicaid and uninsured for outpatient treatment (below)      ONLY FOR [facility]  Below:    Outpatient New Patient Assessment/Therapy Walk-ins:        Monday -Thursday 8am until slots are full.        Every Friday 1pm-4pm  (first come, first served)                   New Patient Psychiatry/Medication Management        Monday-Friday 8am-11am (first come, first served)               For all walk-ins we ask that you arrive by 7:15am, because patients will be seen in the order of arrival.

## 2023-06-23 ENCOUNTER — Other Ambulatory Visit: Payer: Self-pay

## 2023-07-05 ENCOUNTER — Ambulatory Visit (INDEPENDENT_AMBULATORY_CARE_PROVIDER_SITE_OTHER): Payer: No Payment, Other | Admitting: Mental Health

## 2023-07-05 DIAGNOSIS — F22 Delusional disorders: Secondary | ICD-10-CM

## 2023-07-05 DIAGNOSIS — F331 Major depressive disorder, recurrent, moderate: Secondary | ICD-10-CM

## 2023-07-05 DIAGNOSIS — F411 Generalized anxiety disorder: Secondary | ICD-10-CM

## 2023-07-05 NOTE — Progress Notes (Signed)
THERAPIST PROGRESS NOTE Virtual Visit via Video Note  I connected with Denise Barker on 07/05/23 at 10:00 AM EDT by a video enabled telemedicine application and verified that I am speaking with the correct person using two identifiers.  Location: Patient: home address on file Provider: home office   I discussed the limitations of evaluation and management by telemedicine and the availability of in person appointments. The patient expressed understanding and agreed to proceed.  I discussed the assessment and treatment plan with the patient. The patient was provided an opportunity to ask questions and all were answered. The patient agreed with the plan and demonstrated an understanding of the instructions.   The patient was advised to call back or seek an in-person evaluation if the symptoms worsen or if the condition fails to improve as anticipated.  I provided 53 minutes of non-face-to-face time during this encounter.   [name], [facility]   Session Time: 10:03 am ( 53 minutes)  Participation Level: Active  Behavioral Response: CasualAlertDysphoric  Type of Therapy: Individual Therapy  Treatment Goals addressed: AOZ:HYQM will increase manage of depression/anxiety AEB development of effective coping skills with ability to reframe maladaptive thinking 7/7 days within the next 90 days.    STG: Denise will increase socialization AEB engagement in community x 1 weekly within the next 90 days.   ProgressTowards Goals: Progressing  Interventions: CBT and Supportive  Summary: Denise Barker is a 37 y.o. female who presents with dx of major depression moderate, generalized anxiety and cannabis use, paranoia. Denise Barker presents for session alert and oriented; mood and affect adequate.  Speech clear and coherent at normal rate and tone. Engaged and receptive to interventions. Shares for moods to be stable but notes concerns for having been off medications for the past x 2 to x 3 weeks. Shares  to have lost medicaid funding and paying out of pocket for medications have been to expensive. Notes for SSI for son to have been cut off and as a result ceased her medicaid as well. Reports for provider to be aware. Shares with therapist increase in community and engaging in community at least x 1 weekly. Shares socialization with others and notes interactions with other females of interest. Shares to have handled speaking up for her self well and not allowing encounters to bother her. Denies concerns for paranoia but shares, " I am paranoid I am going to get paranoid. " Shares concerning son and worries of him. Explores working to Hydrologist and managing anxieties. Denies SI/HI. Progress with goals.   Suicidal/Homicidal: Nowithout intent/plan  Therapist Response: Therapist engaged Denise in tele-therapy session. Assessed for current location and confidentiality of session. Completed check in and assessed for current level of stressors, sxs management and level of stressors. Supported in processing current concerns in regards to inability to take medications, encouraged to monitor and track presentation of sxs and to reach out ot provider if sxs increase. Encouraged ongoing use of coping skills and ability to monitor cognitions to support in mental health stability. Identified maldaptive thinking patterns present and working to balance thoughts in regards to son and self. Assessed for ongoing progress with personal goals of weight loss. Assessed for level of socialization and ability to engage in community. Educated on ability to get insurance through employer and Development worker, community. Reviewed session and provided follow up. No safety concerns reported.   Plan: Return again in  x 4 weeks.  Diagnosis: MDD (major depressive disorder), recurrent episode, moderate (HCC)  Generalized anxiety disorder  Paranoia (psychosis) (HCC)  Collaboration of Care: Other None   Patient/Guardian was advised  Release of Information must be obtained prior to any record release in order to collaborate their care with an outside provider. Patient/Guardian was advised if they have not already done so to contact the registration department to sign all necessary forms in order for [place] to release information regarding their care.   Consent: Patient/Guardian gives verbal consent for treatment and assignment of benefits for services provided during this visit. Patient/Guardian expressed understanding and agreed to proceed.   Stephan Minister [place], [facility] 07/05/2023

## 2023-07-23 ENCOUNTER — Ambulatory Visit (INDEPENDENT_AMBULATORY_CARE_PROVIDER_SITE_OTHER): Payer: Self-pay | Admitting: Mental Health

## 2023-07-23 DIAGNOSIS — F411 Generalized anxiety disorder: Secondary | ICD-10-CM

## 2023-07-23 DIAGNOSIS — F331 Major depressive disorder, recurrent, moderate: Secondary | ICD-10-CM

## 2023-07-23 DIAGNOSIS — F22 Delusional disorders: Secondary | ICD-10-CM

## 2023-07-23 NOTE — Progress Notes (Unsigned)
THERAPIST PROGRESS NOTE Virtual Visit via Video Note  I connected with Denise Barker on 07/23/23 at  9:00 AM EDT by a video enabled telemedicine application and verified that I am speaking with the correct person using two identifiers.  Location: Patient: home address on file Provider: home office   I discussed the limitations of evaluation and management by telemedicine and the availability of in person appointments. The patient expressed understanding and agreed to proceed.  I discussed the assessment and treatment plan with the patient. The patient was provided an opportunity to ask questions and all were answered. The patient agreed with the plan and demonstrated an understanding of the instructions.   The patient was advised to call back or seek an in-person evaluation if the symptoms worsen or if the condition fails to improve as anticipated.  I provided 55 minutes of non-face-to-face time during this encounter.   Denise Barker, [facility]   Session Time: 9:03 am ( 55 minutes)  Participation Level: Active  Behavioral Response: CasualAlertDysphoric  Type of Therapy: Individual Therapy  Treatment Goals addressed: AOZ:HYQM will increase manage of depression/anxiety AEB development of effective coping skills with ability to reframe maladaptive thinking 7/7 days within the next 90 days.    STG: Denise will increase socialization AEB engagement in community x 1 weekly within the next 90 days.   ProgressTowards Goals: Progressing  Interventions: CBT and Supportive  Summary: Denise Lynn is a 37 y.o. female who presents with dx of major depression moderate, generalized anxiety and cannabis use, paranoia. Denise Barker presents for session alert and oriented; mood and affect adequate.  Speech clear and coherent at normal rate and tone. Shares increase in feelings of paranoia and anxiety. Increase in depressive sxs reported. "Last week was bad." Shares to believe medications cleared her sxs and  increase in sxs presented. Notes feelings of paranoia about the weather, second moon," I am freaking out and I can't understand why others are not freaking out." Shares some decrease in depression this week and working to use skills and challenging thoughts. Shares thoughts on friendship and thoughts of friends leaving her "what is wrong with me." Able to engage with therapist and challenge thoughts and reframe distorted thinking processes.   Suicidal/Homicidal: Nowithout intent/plan  Therapist Response: Therapist engaged Denise in tele-therapy session. Assessed for current location and confidentiality of session. Completed check in and assessed for current level of stressors, sxs management and level of stressors. Supported in processing current concerns in regards to increase in sxs and ability to navigate stressors and cope with sxs. Encouraged follow up with medicaid to support in having medicaid benefits re-instated. Encouraged to follow up with medications provider with increase in psychotic sxs. Engaged in guided discovery to support in increasing balanced thoughts and ability to reframe distorted thoughts. Reviewed session and provided follow up.   Plan: Return again in  x 3 weeks.  Diagnosis: MDD (major depressive disorder), recurrent episode, moderate (HCC)  Paranoia (psychosis) (HCC)  Generalized anxiety disorder  Collaboration of Care: Other None  Patient/Guardian was advised Release of Information must be obtained prior to any record release in order to collaborate their care with an outside provider. Patient/Guardian was advised if they have not already done so to contact the registration department to sign all necessary forms in order for [place] to release information regarding their care.   Consent: Patient/Guardian gives verbal consent for treatment and assignment of benefits for services provided during this visit. Patient/Guardian expressed understanding and agreed to proceed.  Stephan Minister [place], [facility] 07/23/2023

## 2023-07-28 ENCOUNTER — Telehealth (HOSPITAL_COMMUNITY): Payer: Self-pay | Admitting: *Deleted

## 2023-08-06 ENCOUNTER — Other Ambulatory Visit: Payer: Self-pay

## 2023-08-06 ENCOUNTER — Ambulatory Visit (INDEPENDENT_AMBULATORY_CARE_PROVIDER_SITE_OTHER): Payer: Self-pay | Admitting: Mental Health

## 2023-08-06 ENCOUNTER — Other Ambulatory Visit (HOSPITAL_COMMUNITY): Payer: Self-pay | Admitting: Psychiatry

## 2023-08-06 ENCOUNTER — Encounter (HOSPITAL_COMMUNITY): Payer: Self-pay

## 2023-08-06 DIAGNOSIS — F22 Delusional disorders: Secondary | ICD-10-CM

## 2023-08-06 DIAGNOSIS — F331 Major depressive disorder, recurrent, moderate: Secondary | ICD-10-CM

## 2023-08-06 DIAGNOSIS — F411 Generalized anxiety disorder: Secondary | ICD-10-CM

## 2023-08-06 MED ORDER — ARIPIPRAZOLE 10 MG PO TABS
ORAL_TABLET | ORAL | 0 refills | Status: DC
  Filled 2023-08-06: qty 30, 33d supply, fill #0

## 2023-08-06 NOTE — Progress Notes (Unsigned)
THERAPIST PROGRESS NOTE Virtual Visit via Video Note  I connected with Denise Barker on 08/06/23 at  9:00 AM EDT by a video enabled telemedicine application and verified that I am speaking with the correct person using two identifiers.  Location: Patient: home address on file Provider: home office   I discussed the limitations of evaluation and management by telemedicine and the availability of in person appointments. The patient expressed understanding and agreed to proceed.  I discussed the assessment and treatment plan with the patient. The patient was provided an opportunity to ask questions and all were answered. The patient agreed with the plan and demonstrated an understanding of the instructions.   The patient was advised to call back or seek an in-person evaluation if the symptoms worsen or if the condition fails to improve as anticipated.  I provided 56 minutes of non-face-to-face time during this encounter.   [name], [facility]   Session Time: 9:07 am ( 56 minutes)  Participation Level: Active  Behavioral Response: CasualAlertAnxious, Depressed, and Dysphoric  Type of Therapy: Individual Therapy  Treatment Goals addressed: ONG:EXBM will increase manage of depression/anxiety AEB development of effective coping skills with ability to reframe maladaptive thinking 7/7 days within the next 90 days.    STG: Denise will increase socialization AEB engagement in community x 1 weekly within the next 90 days.   ProgressTowards Goals: Progressing  Interventions: CBT and Supportive  Summary: Denise Barker is a 37 y.o. female who presents with dx of major depression moderate, generalized anxiety and cannabis use, paranoia. Denise Barker presents for session alert and oriented; mood and affect low depressed and tearful at times.  Speech clear and coherent at normal rate and tone. Shares increase in feelings of depression, anxiety and paranioa. Notes anxiety has increased to a high level  with thoughts of not wanting to live with this high rate of anxiety. Notes difficulty going outside . Notes " I am scared to walk outside. I am not ok." Notes not able to go outside, was unable to attend trip with son as planned. Shares thoughts " I am never doing well." Thoughts high in distorted believes of absolutes. Difficulty challenging thoughts with therapist. Shares feelings of guilt of having to put high degree of child rearing on partner at this time. Shares to remember concerns for mental health dating back to the 4th grade and questions if she should have had a child with her hx of mental health. Notes for son to be home today and for this to also be challenging. Shares to take Zoloft medications but feels she may need an increase along with returning to take Abilify. " I thought I was doing better." Shares will pay for medication out of pocket if she needs to. Shares difficulty getting through to provider and accepting of therapist support in communicating to provider. Denies active suicidal thoughts and reports ability to be safe. Accepting of review of crisis services. Regression with goals; increase in sxs. Denies SI/HI   Suicidal/Homicidal: Nowithout intent/plan  Therapist Response:  Therapist engaged Denise in tele-therapy session. Assessed for current location and confidentiality of session. Completed check in and assessed for current level of stressors, sxs management and level of stressors. Supported in processing current concerns in regards to increase in sxs and ability to navigate current stressors. Provided safe space to share thoughts and feelings in regards to moods and increase in sxs. Assessed for current safety concerns. Active empathic listening; working to engage in guided discovery and challenging  thoughts with minimal receptiveness. Supported pt with contacting provider during session to alert of concerns and pt desires to return to taking previous medication. Educated on crisis  services and mobile crisis services. Assessed for safety. Reviewed session and provided follow up.   Plan: Return again in  x 4 weeks.  Diagnosis: MDD (major depressive disorder), recurrent episode, moderate (HCC)  Generalized anxiety disorder  Paranoia (psychosis) (HCC)  Collaboration of Care: Medication Management AEB Discussed care with provider Dr. Josephina Shih  Patient/Guardian was advised Release of Information must be obtained prior to any record release in order to collaborate their care with an outside provider. Patient/Guardian was advised if they have not already done so to contact the registration department to sign all necessary forms in order for [place] to release information regarding their care.   Consent: Patient/Guardian gives verbal consent for treatment and assignment of benefits for services provided during this visit. Patient/Guardian expressed understanding and agreed to proceed.   Stephan Minister [place], [facility] 08/06/2023
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
## 2023-08-18 ENCOUNTER — Encounter (HOSPITAL_COMMUNITY): Payer: Self-pay | Admitting: Psychiatry

## 2023-08-18 ENCOUNTER — Telehealth (INDEPENDENT_AMBULATORY_CARE_PROVIDER_SITE_OTHER): Payer: Self-pay | Admitting: Psychiatry

## 2023-08-18 ENCOUNTER — Other Ambulatory Visit: Payer: Self-pay

## 2023-08-18 DIAGNOSIS — F331 Major depressive disorder, recurrent, moderate: Secondary | ICD-10-CM

## 2023-08-18 DIAGNOSIS — F22 Delusional disorders: Secondary | ICD-10-CM

## 2023-08-18 DIAGNOSIS — F4310 Post-traumatic stress disorder, unspecified: Secondary | ICD-10-CM

## 2023-08-18 DIAGNOSIS — F411 Generalized anxiety disorder: Secondary | ICD-10-CM

## 2023-08-18 MED ORDER — SERTRALINE HCL 100 MG PO TABS: 100.0000 mg | ORAL_TABLET | Freq: Every day | ORAL | 2 refills | Status: DC

## 2023-08-18 MED ORDER — HYDROXYZINE HCL 25 MG PO TABS: 25.0000 mg | ORAL_TABLET | Freq: Two times a day (BID) | ORAL | 1 refills | Status: DC | PRN

## 2023-08-18 MED ORDER — ARIPIPRAZOLE 10 MG PO TABS: 10.0000 mg | ORAL_TABLET | Freq: Every day | ORAL | 2 refills | Status: DC

## 2023-08-18 NOTE — Patient Instructions (Signed)
Thank you for attending your appointment today.  -- INCREASE Zoloft to 100 mg daily -- Continue Abilify 10 mg daily -- START Atarax 25-50 mg twice daily as needed for acute anxiety -- Continue other medications as prescribed.  Please do not make any changes to medications without first discussing with your provider. If you are experiencing a psychiatric emergency, please call 911 or present to your nearest emergency department. Additional crisis, medication management, and therapy resources are included below.  [facility]  [address], [place], [place] [address] [phone] WALK-IN URGENT CARE 24/7 FOR ANYONE [address] [place], [place]  [phone] Fax: [phone] [email or web address] *Interpreters available *Accepts all insurance and uninsured for Urgent Care needs *Accepts Medicaid and uninsured for outpatient treatment (below)      ONLY FOR [facility]  Below:    Outpatient New Patient Assessment/Therapy Walk-ins:        Monday -Thursday 8am until slots are full.        Every Friday 1pm-4pm  (first come, first served)                   New Patient Psychiatry/Medication Management        Monday-Friday 8am-11am (first come, first served)               For all walk-ins we ask that you arrive by 7:15am, because patients will be seen in the order of arrival.

## 2023-08-20 ENCOUNTER — Ambulatory Visit (INDEPENDENT_AMBULATORY_CARE_PROVIDER_SITE_OTHER): Payer: Self-pay | Admitting: Mental Health

## 2023-08-20 DIAGNOSIS — F411 Generalized anxiety disorder: Secondary | ICD-10-CM

## 2023-08-20 DIAGNOSIS — F331 Major depressive disorder, recurrent, moderate: Secondary | ICD-10-CM

## 2023-08-20 DIAGNOSIS — F22 Delusional disorders: Secondary | ICD-10-CM

## 2023-08-20 NOTE — Progress Notes (Unsigned)
THERAPIST PROGRESS NOTE Virtual Visit via Video Note  I connected with Denise Barker on 08/20/23 at  9:00 AM EDT by a video enabled telemedicine application and verified that I am speaking with the correct person using two identifiers.  Location: Patient: home address on file Provider: home office   I discussed the limitations of evaluation and management by telemedicine and the availability of in person appointments. The patient expressed understanding and agreed to proceed.  I discussed the assessment and treatment plan with the patient. The patient was provided an opportunity to ask questions and all were answered. The patient agreed with the plan and demonstrated an understanding of the instructions.   The patient was advised to call back or seek an in-person evaluation if the symptoms worsen or if the condition fails to improve as anticipated.  I provided 54 minutes of non-face-to-face time during this encounter.   Denise Barker, [facility]   Session Time: 9:02 am ( 54 minutes)  Participation Level: Active  Behavioral Response: CasualAlertDysphoric  Type of Therapy: Individual Therapy  Treatment Goals addressed: ZOX:WRUE will increase manage of depression/anxiety AEB development of effective coping skills with ability to reframe maladaptive thinking 7/7 days within the next 90 days.    STG: Denise will increase socialization AEB engagement in community x 1 weekly within the next 90 days.   ProgressTowards Goals: Progressing  Interventions: CBT and Supportive  Summary: Denise Barker is a 37 y.o. female who presents with dx of major depression moderate, generalized anxiety and cannabis use, paranoia. Denise Barker presents for session alert and oriented; mood and affect low depressed.  Speech clear and coherent at normal rate and tone. 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 Shares for mood to have improved and has restarted medications. Shares thoughts on feeling as if her anxiety  is hopeless and to feel as if she will always have the butterfly feeling in her stomach. Shares with therapist previous night to have not felt those butterflies and feels she was anxiety free. Explores with therapist coaching factors that lead into that state of lacking anxiety. Explores and identifies other periods of time in which anxiety was greatly decreased and/or lacking all together. Shares thoughts on presenting to fair and another festival in which anxiety was not the focus. Agrees to work on practicing switching her level of focus from internal to external focus and reviewed grounding techniques. Agrees to practice. Ongoing work towards goals. Denies SI/HI  Suicidal/Homicidal: Nowithout intent/plan  Therapist Response: Therapist engaged Denise in tele-therapy session. Assessed for current location and confidentiality of session. Completed check in and assessed for current level of stressors, sxs management and level of stressors. Supported in processing current concerns in regards to feelings of anxiety and ongoing low mood. Engaged Denise Barker in working to challenging thoughts and distorted thoughts of absolute thoughts. Explored with pt times in which she was doing well and was able to go outside for walks with greater ease and other things she has been able to do with reduced anxiety. Supported in reframing thoughts. Explored current level of socialization and community engagement. Educated on ability to work to shift her level of focus and effect this can have on feelings of anxiety. Reviewed session and provided follow up. No safety concerns reported.   Plan: Return again in  x 3 weeks.  Diagnosis: MDD (major depressive disorder), recurrent episode, moderate (HCC)  Generalized anxiety disorder  Paranoia (psychosis) (HCC)  Collaboration of Care: Other None  Patient/Guardian was advised  Release of Information must be obtained prior to any record release in order to collaborate their care with an  outside provider. Patient/Guardian was advised if they have not already done so to contact the registration department to sign all necessary forms in order for [place] to release information regarding their care.   Consent: Patient/Guardian gives verbal consent for treatment and assignment of benefits for services provided during this visit. Patient/Guardian expressed understanding and agreed to proceed.   Denise Barker [place], [facility] 08/20/2023

## 2023-09-10 ENCOUNTER — Ambulatory Visit (INDEPENDENT_AMBULATORY_CARE_PROVIDER_SITE_OTHER): Payer: Self-pay | Admitting: Mental Health

## 2023-09-10 DIAGNOSIS — F331 Major depressive disorder, recurrent, moderate: Secondary | ICD-10-CM

## 2023-09-10 DIAGNOSIS — F411 Generalized anxiety disorder: Secondary | ICD-10-CM

## 2023-09-10 DIAGNOSIS — F22 Delusional disorders: Secondary | ICD-10-CM

## 2023-09-10 NOTE — Progress Notes (Unsigned)
THERAPIST PROGRESS NOTE Virtual Visit via Video Note  I connected with Denise Barker on 09/10/23 at  9:00 AM EST by a video enabled telemedicine application and verified that I am speaking with the correct person using two identifiers.  Location: Patient: home address on file Provider: home office   I discussed the limitations of evaluation and management by telemedicine and the availability of in person appointments. The patient expressed understanding and agreed to proceed.  I discussed the assessment and treatment plan with the patient. The patient was provided an opportunity to ask questions and all were answered. The patient agreed with the plan and demonstrated an understanding of the instructions.   The patient was advised to call back or seek an in-person evaluation if the symptoms worsen or if the condition fails to improve as anticipated.  I provided 55 minutes of non-face-to-face time during this encounter.   Denise Barker, [facility]   Session Time: 9:03 am ( 55 minutes)  Participation Level: Active  Behavioral Response: CasualAlertEuthymic  Type of Therapy: Individual Therapy  Treatment Goals addressed:  NGE:XBMW will increase manage of depression/anxiety AEB development of effective coping skills with ability to reframe maladaptive thinking 7/7 days within the next 90 days.    STG: Denise will increase socialization AEB engagement in community x 1 weekly within the next 90 days.    ProgressTowards Goals: Progressing  Interventions: CBT and Supportive  Summary:  Denise Barker is a 37 y.o. female who presents with dx of major depression moderate, generalized anxiety and cannabis use, paranoia. Denise Barker presents for session alert and oriented; mood and affect low; slightly dysphoric.  Speech clear and coherent at normal rate and tone. 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 Notes improvement in moods and shares for medications to be a support. Notes reduced anxiety and  no longer has butterfly feelings in her stomach. Shares event in which she went into community independently and was able to shop for son but shares increased anxiety during engagement. Shares feelings of frustration at work with desire to move to new position with new company however continues ot have to wait. Share s thoughts on feedings of insecurity with body sharing to have HS and notes for this to be a big factor and not liking to go into community, sharing during recent outing for a bump to have popped while she was in public. Shares hx of mother and family members being judgmental on others which also adds to insecurity and difficulty getting out in community at times. Shares feelings of paranoia but is able to not engage and "but in a box in the back of my mine but it is still there."Shares able to engage in coping thoughts and works to reframe thoughts of insecurity of self. Progress with goals; no safety concerns reported.   Suicidal/Homicidal: Nowithout intent/plan  Therapist Response: Therapist engaged Denise in tele-therapy session. Assessed for current location and confidentiality of session. Completed check in and assessed for current level of stressors, sxs management and level of stressors. Supported in processing current concerns in regards to feelings of anxiety and ongoing low mood. Engaged Denise Barker in working to challenging thoughts and working through Film/video editor of new job vs. Waiting for other position to come through. Supported in working through and challenging maladaptive thoughts related to body self-consciousness and presence of disorder. Provided support and encouragement and encouraged working to externalize feelings of anxiety and ability to engage in coping skills. Reviewed session and provided follow up.  Plan: Return again in  x 4 weeks.  Diagnosis: MDD (major depressive disorder), recurrent episode, moderate (HCC)  Generalized anxiety disorder  Paranoia (psychosis)  (HCC)  Collaboration of Care: Other None  Patient/Guardian was advised Release of Information must be obtained prior to any record release in order to collaborate their care with an outside provider. Patient/Guardian was advised if they have not already done so to contact the registration department to sign all necessary forms in order for [place] to release information regarding their care.   Consent: Patient/Guardian gives verbal consent for treatment and assignment of benefits for services provided during this visit. Patient/Guardian expressed understanding and agreed to proceed.   Denise Barker [place], [facility] 09/10/2023
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
## 2023-09-29 ENCOUNTER — Encounter (HOSPITAL_COMMUNITY): Payer: Self-pay | Admitting: Psychiatry

## 2023-09-29 ENCOUNTER — Telehealth (HOSPITAL_COMMUNITY): Payer: Self-pay | Admitting: Psychiatry

## 2023-09-29 DIAGNOSIS — F4310 Post-traumatic stress disorder, unspecified: Secondary | ICD-10-CM

## 2023-09-29 DIAGNOSIS — F331 Major depressive disorder, recurrent, moderate: Secondary | ICD-10-CM

## 2023-09-29 DIAGNOSIS — F22 Delusional disorders: Secondary | ICD-10-CM

## 2023-09-29 DIAGNOSIS — F1290 Cannabis use, unspecified, uncomplicated: Secondary | ICD-10-CM

## 2023-09-29 DIAGNOSIS — F411 Generalized anxiety disorder: Secondary | ICD-10-CM

## 2023-09-29 MED ORDER — SERTRALINE HCL 100 MG PO TABS: 100.0000 mg | ORAL_TABLET | Freq: Every day | ORAL | 2 refills | Status: DC

## 2023-09-29 MED ORDER — ARIPIPRAZOLE 10 MG PO TABS: 10.0000 mg | ORAL_TABLET | Freq: Every day | ORAL | 2 refills | Status: DC

## 2023-09-29 NOTE — Patient Instructions (Signed)

## 2023-10-01 ENCOUNTER — Ambulatory Visit (HOSPITAL_COMMUNITY): Payer: Self-pay | Admitting: Mental Health

## 2023-10-19 ENCOUNTER — Ambulatory Visit (HOSPITAL_COMMUNITY): Payer: Self-pay | Admitting: Mental Health

## 2023-10-19 DIAGNOSIS — F1290 Cannabis use, unspecified, uncomplicated: Secondary | ICD-10-CM

## 2023-10-19 DIAGNOSIS — F331 Major depressive disorder, recurrent, moderate: Secondary | ICD-10-CM

## 2023-10-19 DIAGNOSIS — F4310 Post-traumatic stress disorder, unspecified: Secondary | ICD-10-CM
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
## 2023-11-11 ENCOUNTER — Telehealth (HOSPITAL_COMMUNITY): Payer: Self-pay | Admitting: Psychiatry

## 2023-11-19 ENCOUNTER — Ambulatory Visit (INDEPENDENT_AMBULATORY_CARE_PROVIDER_SITE_OTHER): Payer: No Payment, Other | Admitting: Mental Health

## 2023-11-19 DIAGNOSIS — F331 Major depressive disorder, recurrent, moderate: Secondary | ICD-10-CM

## 2023-11-19 DIAGNOSIS — F411 Generalized anxiety disorder: Secondary | ICD-10-CM | POA: Diagnosis not present

## 2023-11-19 DIAGNOSIS — F22 Delusional disorders: Secondary | ICD-10-CM

## 2023-11-19 NOTE — Progress Notes (Unsigned)
THERAPIST PROGRESS NOTE Virtual Visit via Video Note  I connected with Denise Barker on 11/19/2023 at  9:00 AM EST by a video enabled telemedicine application and verified that I am speaking with the correct person using two identifiers.  Location: Patient: address on file Provider: home office   I discussed the limitations of evaluation and management by telemedicine and the availability of in person appointments. The patient expressed understanding and agreed to proceed.  I discussed the assessment and treatment plan with the patient. The patient was provided an opportunity to ask questions and all were answered. The patient agreed with the plan and demonstrated an understanding of the instructions.   The patient was advised to call back or seek an in-person evaluation if the symptoms worsen or if the condition fails to improve as anticipated.  I provided 33 minutes of non-face-to-face time during this encounter.   [name], [facility]   Session Time: 9:07 am ( 33 minutes)  Participation Level: Active  Behavioral Response: Casual and NeatAlertEuthymic  Type of Therapy: Individual Therapy  Treatment Goals addressed: ZOX:WRUE will increase manage of depression/anxiety AEB development of effective coping skills with ability to reframe maladaptive thinking 7/7 days within the next 90 days.    STG: Denise will increase socialization AEB engagement in community x 1 weekly within the next 90 days.   ProgressTowards Goals: Progressing  Interventions: Supportive  Summary:  Denise Barker is a 38 y.o. female who presents with dx of major depression moderate, generalized anxiety and cannabis use, paranoia. Gwendola presents for session alert and oriented; mood and affect adequate.  Speech clear and coherent at normal rate and tone. Shares to be doing well and denies concerns for current paranoia, depression or anxiety. Notes has thoughts of conspiracy with recent plan crash but is working to  need feed into this thought. Shares has been doing pretty good and has been engaging more in the community. Shares tentative date of starting new employment this summer. Notes continuing to see new female partner and for this to being well and helps to get her out more. Reports confidence boost with new partner. Shares feelings of anxiousness towards upcoming trip home and explores use of cope ahead skill to support. Shares need to preent to sons award ceremony so need of ending session early today. Shares to be medication compliant and denies current safety concerns.   Suicidal/Homicidal: Nowithout intent/plan  Therapist Response: Therapist engaged Denise in tele-therapy session. Assessed for current location and confidentiality of session. Completed check in and assessed for current level of stressors, sxs management and level of stressors. Supported in processing current concerns in regards to feelings of anxiety and paranoia. Engaged in exploring ability to use cope ahead strategies and working to prepare for upcoming trip and ability to handle interactions with family members. Encouraged ongoing engagement in community and continuing to engage in balance thoughts. Reviewed session and provided follow up.   Plan: Return again in   2 weeks.  Diagnosis: Generalized anxiety disorder  Paranoia (psychosis) (HCC)  MDD (major depressive disorder), recurrent episode, moderate (HCC)  Collaboration of Care: Other None  Patient/Guardian was advised Release of Information must be obtained prior to any record release in order to collaborate their care with an outside provider. Patient/Guardian was advised if they have not already done so to contact the registration department to sign all necessary forms in order for [place] to release information regarding their care.   Consent: Patient/Guardian gives verbal consent for treatment  and assignment of benefits for services provided during this visit. Patient/Guardian  expressed understanding and agreed to proceed.   Stephan Minister [place], [facility] 11/19/2023
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
## 2023-12-01 ENCOUNTER — Telehealth (INDEPENDENT_AMBULATORY_CARE_PROVIDER_SITE_OTHER): Payer: No Payment, Other | Admitting: Psychiatry

## 2023-12-01 ENCOUNTER — Encounter (HOSPITAL_COMMUNITY): Payer: Self-pay | Admitting: Psychiatry

## 2023-12-01 DIAGNOSIS — F4310 Post-traumatic stress disorder, unspecified: Secondary | ICD-10-CM

## 2023-12-01 DIAGNOSIS — F22 Delusional disorders: Secondary | ICD-10-CM

## 2023-12-01 DIAGNOSIS — F1290 Cannabis use, unspecified, uncomplicated: Secondary | ICD-10-CM

## 2023-12-01 DIAGNOSIS — F331 Major depressive disorder, recurrent, moderate: Secondary | ICD-10-CM

## 2023-12-01 MED ORDER — HYDROXYZINE HCL 25 MG PO TABS: 25.0000 mg | ORAL_TABLET | Freq: Two times a day (BID) | ORAL | 1 refills | Status: DC | PRN

## 2023-12-01 MED ORDER — ARIPIPRAZOLE 10 MG PO TABS: 10.0000 mg | ORAL_TABLET | Freq: Every day | ORAL | 0 refills | Status: DC

## 2023-12-01 MED ORDER — SERTRALINE HCL 100 MG PO TABS: 100.0000 mg | ORAL_TABLET | Freq: Every day | ORAL | 0 refills | Status: DC

## 2023-12-01 NOTE — Patient Instructions (Signed)

## 2023-12-03 ENCOUNTER — Ambulatory Visit (HOSPITAL_COMMUNITY): Payer: No Payment, Other | Admitting: Mental Health

## 2023-12-03 DIAGNOSIS — F331 Major depressive disorder, recurrent, moderate: Secondary | ICD-10-CM

## 2023-12-03 DIAGNOSIS — F411 Generalized anxiety disorder: Secondary | ICD-10-CM | POA: Diagnosis not present

## 2023-12-03 DIAGNOSIS — F22 Delusional disorders: Secondary | ICD-10-CM

## 2023-12-03 NOTE — Progress Notes (Signed)
 THERAPIST PROGRESS NOTE Virtual Visit via Video Note  I connected with Denise Barker on 12/03/23 at  9:00 AM EST by a video enabled telemedicine application and verified that I am speaking with the correct person using two identifiers.  Location: Patient: home address on file Provider: home office   I discussed the limitations of evaluation and management by telemedicine and the availability of in person appointments. The patient expressed understanding and agreed to proceed.  I discussed the assessment and treatment plan with the patient. The patient was provided an opportunity to ask questions and all were answered. The patient agreed with the plan and demonstrated an understanding of the instructions.   The patient was advised to call back or seek an in-person evaluation if the symptoms worsen or if the condition fails to improve as anticipated.  I provided 54 minutes of non-face-to-face time during this encounter.   [name], [facility]   Session Time: 9:03 am ( 54 minures)  Participation Level: Active  Behavioral Response: CasualAlertEuthymic  Type of Therapy: Individual Therapy  Treatment Goals addressed:  ZOX:WRUE will increase manage of depression/anxiety AEB development of effective coping skills with ability to reframe maladaptive thinking 7/7 days within the next 90 days.    STG: Denise will increase socialization AEB engagement in community x 1 weekly within the next 90 days.   ProgressTowards Goals: Progressing  Interventions: CBT and Supportive  Summary: Denise Barker is a 38 y.o. female who presents with dx of major depression moderate, generalized anxiety and cannabis use, paranoia. Amaya presents for session alert and oriented; mood and affect adequate.  Speech clear and coherent at normal rate and tone. Shares to be doing well and denies concerns for current paranoia, depression or anxiety. Currently does not have medications for the past week but plans to  obtain today. Shares current mood shift with current holiday and denies for boyfriend to have made plans and shares to doubt for him to given her anything. Explored with therapist working to be clear in communication and expectations. Shares thoughts on engagement in new female partner and thoughts of increased self esteem and self-confidence and increase in engagement in community. " I am dressing better." Notes ongoing anxiety with social situations. " I start sweating and thinking everyone is looking at me." Shares hx of witnessing mother speak ill of her self and her weight and shares to have internalized that thought process in her internal dialogue. Explored with therapist core belief system of self and working to challenge thoughts and belief of self. Agrees to work to increase awareness of negative self talk and working to engaging affirming self. Denies safety concerns.   Suicidal/Homicidal: Nowithout intent/plan  Therapist Response:  Therapist engaged Denise in tele-therapy session. Assessed for current location and confidentiality of session. Completed check in and assessed for current level of stressors, sxs management and level of stressors. Supported in processing current concerns in with partner and provided follow up. Explored working to engage in clear communication and being specific in what should would like to see from partner and abilty to compromise. Encouraged balanced thoughts in regards to current holiday. Engaged in education of core belief system and internal dialogue/self talk. Encouraged working to increase awareness of internal dialogue and working to build self-esteem. Reviewed distorted thoughts and presence of mind reading thoughts contributing to feelings of anxiety. Reviewed working through thoughts of worst case scenario and identifying what is likely to occur with social engagement. Reviewed session and provided follow up.  Plan: Return again in  x 2 weeks.  Diagnosis:  Generalized anxiety disorder  MDD (major depressive disorder), recurrent episode, moderate (HCC)  Paranoia (psychosis) (HCC)  Collaboration of Care: Other None  Patient/Guardian was advised Release of Information must be obtained prior to any record release in order to collaborate their care with an outside provider. Patient/Guardian was advised if they have not already done so to contact the registration department to sign all necessary forms in order for [place] to release information regarding their care.   Consent: Patient/Guardian gives verbal consent for treatment and assignment of benefits for services provided during this visit. Patient/Guardian expressed understanding and agreed to proceed.   Stephan Minister [place], [facility] 12/03/2023

## 2023-12-17 ENCOUNTER — Ambulatory Visit (HOSPITAL_COMMUNITY): Payer: No Payment, Other | Admitting: Mental Health

## 2023-12-17 DIAGNOSIS — F22 Delusional disorders: Secondary | ICD-10-CM

## 2023-12-17 DIAGNOSIS — F331 Major depressive disorder, recurrent, moderate: Secondary | ICD-10-CM

## 2023-12-17 DIAGNOSIS — F411 Generalized anxiety disorder: Secondary | ICD-10-CM

## 2023-12-17 NOTE — Progress Notes (Signed)
 THERAPIST PROGRESS NOTE Virtual Visit via Video Note  I connected with Denise Barker on 12/17/23 at 10:00 AM EST by a video enabled telemedicine application and verified that I am speaking with the correct person using two identifiers.  Location: Patient: home address on file Provider: home office   I discussed the limitations of evaluation and management by telemedicine and the availability of in person appointments. The patient expressed understanding and agreed to proceed.  I discussed the assessment and treatment plan with the patient. The patient was provided an opportunity to ask questions and all were answered. The patient agreed with the plan and demonstrated an understanding of the instructions.   The patient was advised to call back or seek an in-person evaluation if the symptoms worsen or if the condition fails to improve as anticipated.  I provided 27 minutes of non-face-to-face time during this encounter.   Denise Barker, [facility]   Session Time: 10:04 am ( 27 minutes)  Participation Level: Active  Behavioral Response: Neat and Well GroomedAlertEuthymic  Type of Therapy: Individual Therapy  Treatment Goals addressed: ZOX:WRUE will increase manage of depression/anxiety AEB development of effective coping skills with ability to reframe maladaptive thinking 7/7 days within the next 90 days.    STG: Denise will increase socialization AEB engagement in community x 1 weekly within the next 90 days.   ProgressTowards Goals: Progressing  Interventions: Supportive  Summary:  Denise Barker is a 38 y.o. female who presents with dx of major depression moderate, generalized anxiety and cannabis use, paranoia. Camaya presents for session alert and oriented; mood and affect adequate.  Speech clear and coherent at normal rate and tone. Shares to be doing well and denies concerns for current paranoia or depression. Shares chief complaint of interaction with female she has been dating.  Shares with therapist thoughts on recent interactions with her and to feeling as if she is being heard, reporting for her to not acknowledge things she shares and to feel invisible alt times. Shares to have been doing better with anxiety in public settings reporting to have presenting to mall recently and denies panic/anxiety attacks, denies sweating or other typical sxs of anxiety for her that present. Shares has been working to stick to her boundaries and communicate needs. Shares progress with goals with challenging anxious thoughts and attending community at least x 1 weekly. Shares another appointment so need to end session early. No safety concerns reported.    Suicidal/Homicidal: Nowithout intent/plan  Therapist Response: Therapist engaged Denise in tele-therapy session. Assessed for current location and confidentiality of session. Completed check in and assessed for current level of stressors, sxs management and level of stressors. Supported in processing current concerns in with partner and provided follow up. Explored and reviewed healthy vs. Unhealthy relationship dynamics and love bombing behaviors. Explored ability to communicate thoughts and feelings and assert her boundaries as needed. Reviewed not be responsible for others feelings with ability to share her concerns. Reviewed ability to work through feelings of anxiety. Reviewed session and provided follow up.   Plan: Return again in x 4  weeks.  Diagnosis: Generalized anxiety disorder  MDD (major depressive disorder), recurrent episode, moderate (HCC)  Paranoia (psychosis) (HCC)  Collaboration of Care: Other None  Patient/Guardian was advised Release of Information must be obtained prior to any record release in order to collaborate their care with an outside provider. Patient/Guardian was advised if they have not already done so to contact the registration department to sign all necessary  forms in order for [place] to release information  regarding their care.   Consent: Patient/Guardian gives verbal consent for treatment and assignment of benefits for services provided during this visit. Patient/Guardian expressed understanding and agreed to proceed.   Stephan Minister [place], [facility] 12/17/2023

## 2023-12-20 ENCOUNTER — Telehealth (HOSPITAL_COMMUNITY): Payer: Self-pay

## 2023-12-20 NOTE — Telephone Encounter (Signed)
 Hello, I told them to fax papers at or fax machine at the front desk. I checked earlier and there was no papers faxed in.

## 2023-12-20 NOTE — Telephone Encounter (Signed)
 Hello,     [employer] [facility] clinic rep called on behalf of pt to re refill pts paper work. States that papers that were faxed were blank.  Safety clearance checklist is the documents that they stated is needed.

## 2024-01-14 ENCOUNTER — Ambulatory Visit (HOSPITAL_COMMUNITY): Payer: No Payment, Other | Admitting: Mental Health

## 2024-01-14 DIAGNOSIS — F411 Generalized anxiety disorder: Secondary | ICD-10-CM

## 2024-01-14 DIAGNOSIS — F331 Major depressive disorder, recurrent, moderate: Secondary | ICD-10-CM

## 2024-01-14 DIAGNOSIS — F22 Delusional disorders: Secondary | ICD-10-CM

## 2024-01-14 NOTE — Progress Notes (Signed)
 THERAPIST PROGRESS NOTE Virtual Visit via Video Note  I connected with Denise Courtwright on 01/14/24 at 10:00 AM EDT by a video enabled telemedicine application and verified that I am speaking with the correct person using two identifiers.  Location: Patient: home address on file Provider: home office   I discussed the limitations of evaluation and management by telemedicine and the availability of in person appointments. The patient expressed understanding and agreed to proceed.  I discussed the assessment and treatment plan with the patient. The patient was provided an opportunity to ask questions and all were answered. The patient agreed with the plan and demonstrated an understanding of the instructions.   The patient was advised to call back or seek an in-person evaluation if the symptoms worsen or if the condition fails to improve as anticipated.  I provided 50 minutes of non-face-to-face time during this encounter.   Denise Barker, [facility]   Session Time: 10:05 am (   Participation Level: Active  Behavioral Response: Casual and NeatAlertWNL  Type of Therapy: Individual Therapy  Treatment Goals addressed:  ZOX:WRUE will increase manage of depression/anxiety AEB development of effective coping skills with ability to reframe maladaptive thinking 7/7 days within the next 90 days.    STG: Denise will increase socialization AEB engagement in community x 1 weekly within the next 90 days.   ProgressTowards Goals: Progressing  Interventions: CBT and Supportive  Summary:   Denise Barker is a 38 y.o. female who presents with dx of major depression moderate, generalized anxiety and cannabis use, paranoia. Laquan presents for session alert and oriented; mood and affect adequate.  Speech clear and coherent at normal rate and tone. Shares to be doing well and denies concerns for current paranoia or depression. Shares chief complaint of interaction with others and thoughts on a personality  disorder. Denies intense anger, hx of self-harm behaviors. Some abandonment concerns. Notes concern for "limerence." Shares thoughts on difficulty with managing her emotions when others may be upset with her or thoughts of interacting with others she knows may not be best for her. Explores with therapist concerns and able to state emotional co-dependency on others. Explores relationship with mother and hx of co-dependency with family members. Shares at times holding poor boundaries and people pleasing behaviors. Explored working o increase personal boundaries and working to increase awareness of co-dependent behaviors. Denies safety concerns. Ongoing work towards goals.   Suicidal/Homicidal: Nowithout intent/plan  Therapist Response: Therapist engaged Denise in tele-therapy session. Assessed for current location and confidentiality of session. Completed check in and assessed for current level of stressors, sxs management and level of stressors. Supported in processing current concerns and explores thoughts on co-dependency, low self esteem and poor emotional boundaries with others. Reviewed borderline personality disorder vs. Traits. Explored hx of childhood/upbringing and processed hx of triangulation and enmeshment of emotions with family. Encouraged working to increase self esteem, boundaries and awareness of co-dependency behaviors. Reviewed session and provided follow up.   Plan: Return again in  x 3 weeks.  Diagnosis: Generalized anxiety disorder  MDD (major depressive disorder), recurrent episode, moderate (HCC)  Paranoia (psychosis) (HCC)  Collaboration of Care: Other None  Patient/Guardian was advised Release of Information must be obtained prior to any record release in order to collaborate their care with an outside provider. Patient/Guardian was advised if they have not already done so to contact the registration department to sign all necessary forms in order for [place] to release information  regarding their care.  Consent: Patient/Guardian gives verbal consent for treatment and assignment of benefits for services provided during this visit. Patient/Guardian expressed understanding and agreed to proceed.   Stephan Minister [place], [facility] 01/14/2024

## 2024-01-28 ENCOUNTER — Ambulatory Visit (INDEPENDENT_AMBULATORY_CARE_PROVIDER_SITE_OTHER): Payer: No Payment, Other | Admitting: Mental Health

## 2024-01-28 DIAGNOSIS — F4310 Post-traumatic stress disorder, unspecified: Secondary | ICD-10-CM | POA: Diagnosis not present

## 2024-01-28 DIAGNOSIS — F331 Major depressive disorder, recurrent, moderate: Secondary | ICD-10-CM | POA: Diagnosis not present

## 2024-01-28 DIAGNOSIS — F411 Generalized anxiety disorder: Secondary | ICD-10-CM

## 2024-01-28 NOTE — Progress Notes (Signed)
 THERAPIST PROGRESS NOTE Virtual Visit via Video Note  I connected with Denise Barker on 01/28/24 at  8:00 AM EDT by a video enabled telemedicine application and verified that I am speaking with the correct person using two identifiers.  Location: Patient: home address on file Provider: home office   I discussed the limitations of evaluation and management by telemedicine and the availability of in person appointments. The patient expressed understanding and agreed to proceed.  I discussed the assessment and treatment plan with the patient. The patient was provided an opportunity to ask questions and all were answered. The patient agreed with the plan and demonstrated an understanding of the instructions.   The patient was advised to call back or seek an in-person evaluation if the symptoms worsen or if the condition fails to improve as anticipated.  I provided 48 minutes of non-face-to-face time during this encounter.   [name], [facility]   Session Time: 8:02 am (   Participation Level: Active  Behavioral Response: CasualAlertAdequate  Type of Therapy: Individual Therapy  Treatment Goals addressed: ZOX:WRUE will increase manage of depression/anxiety AEB development of effective coping skills with ability to reframe maladaptive thinking 7/7 days within the next 90 days.    STG: Denise will increase socialization AEB engagement in community x 1 weekly within the next 90 days.   ProgressTowards Goals: Progressing  Interventions: CBT and Supportive  Summary:  Denise Barker is a 38 y.o. female who presents with dx of major depression moderate, generalized anxiety and cannabis use, paranoia. Bertina presents for session alert and oriented; mood and affect adequate.  Speech clear and coherent at normal rate and tone. Shares to be doing ok but notes to still have low feelings about female relationship that ended. Shares with therapist event sof interacting with another female. Explored  with therapist lessons leaned with interactions with others. Processed presence of boundaries and working to be more aware of co-dependency concerns. Explored thoughts on mother wound and how this has effected her interactions with others. Shares feelings of anxiety with presenting in community and is able to identify possible coping skills of using music to support in managing. Agrees to work on increase identification of hobbies and "dating herself." Denies SI/HI. Ongoing work towards goals.   Suicidal/Homicidal: Nowithout intent/plan  Therapist Response: Therapist engaged Denise in tele-therapy session. Assessed for current location and confidentiality of session. Completed check in and assessed for current level of stressors, sxs management and level of stressors. Supported in processing current concerns and explores thoughts of interaction with females. Explored presence of mother would and ways in which this has effected her interactions with others. Supported processing self-esteem, boundaries and abiliy to be independent with self and emotions. Encouraged working to increase awareness of self and getting to know self. Encouraged working to date herself and getting to know who she is and identification of hobbies and things in which she enjoys. Reviewed session and provided follow up.   Plan: Return again in  x 4 weeks.  Diagnosis: PTSD (post-traumatic stress disorder)  MDD (major depressive disorder), recurrent episode, moderate (HCC)  Generalized anxiety disorder  Collaboration of Care: Other None  Patient/Guardian was advised Release of Information must be obtained prior to any record release in order to collaborate their care with an outside provider. Patient/Guardian was advised if they have not already done so to contact the registration department to sign all necessary forms in order for [place] to release information regarding their care.   Consent:  Patient/Guardian gives verbal consent for  treatment and assignment of benefits for services provided during this visit. Patient/Guardian expressed understanding and agreed to proceed.   Stephan Minister [place], [facility] 01/28/2024

## 2024-02-01 NOTE — Progress Notes (Unsigned)
 Patient did not connect for virtual psychiatric medication management appointment on 02/02/24 at [facility]. Sent secure video link with no response. Left VM with callback number to reschedule.  Adell Hones, MD 02/02/24

## 2024-02-02 ENCOUNTER — Encounter (HOSPITAL_COMMUNITY): Payer: No Payment, Other | Admitting: Psychiatry

## 2024-02-02 ENCOUNTER — Encounter (HOSPITAL_COMMUNITY): Payer: Self-pay

## 2024-03-06 ENCOUNTER — Ambulatory Visit (HOSPITAL_COMMUNITY): Admitting: Mental Health

## 2024-03-17 ENCOUNTER — Ambulatory Visit (HOSPITAL_COMMUNITY): Admitting: Mental Health

## 2024-03-17 DIAGNOSIS — F411 Generalized anxiety disorder: Secondary | ICD-10-CM | POA: Diagnosis not present

## 2024-03-17 DIAGNOSIS — F331 Major depressive disorder, recurrent, moderate: Secondary | ICD-10-CM | POA: Diagnosis not present

## 2024-03-17 DIAGNOSIS — F22 Delusional disorders: Secondary | ICD-10-CM

## 2024-03-17 NOTE — Progress Notes (Signed)
 THERAPIST PROGRESS NOTE Virtual Visit via Video Note  I connected with Denise Barker on 03/17/24 at  8:00 AM EDT by a video enabled telemedicine application and verified that I am speaking with the correct person using two identifiers.  Location: Patient: Home address on file Provider: home office   I discussed the limitations of evaluation and management by telemedicine and the availability of in person appointments. The patient expressed understanding and agreed to proceed.  I discussed the assessment and treatment plan with the patient. The patient was provided an opportunity to ask questions and all were answered. The patient agreed with the plan and demonstrated an understanding of the instructions.   The patient was advised to call back or seek an in-person evaluation if the symptoms worsen or if the condition fails to improve as anticipated.  I provided 24 minutes of non-face-to-face time during this encounter.   Denise Barker, [facility]   Session Time: 8:02 am  Participation Level: Active  Behavioral Response: CasualAlertWNL  Type of Therapy: Individual Therapy  Treatment Goals addressed: GNF:AOZH will increase manage of depression/anxiety AEB development of effective coping skills with ability to reframe maladaptive thinking 7/7 days within the next 90 days.    STG: Denise will increase socialization AEB engagement in community x 1 weekly within the next 90 days.   ProgressTowards Goals: Progressing  Interventions: CBT and Supportive  Summary:  Denise Barker is a 38 y.o. female who presents with dx of major depression moderate, generalized anxiety and cannabis use, paranoia. Denise Barker presents for session alert and oriented; mood and affect adequate.  Speech clear and coherent at normal rate and tone. Shares to be doing ok but notes thoughts of concern for son and his poor eating habits, relationship concerns with thoughts of ambivalence about longevity of relationship and  feelings of anxiety and paranoia. Notes those concerns to be factors in feelings of depression, however shares to also feel grateful for things with upcoming new position. Shares thoughts on relations and explores with therapist ability to engage in difficulty conversation and expression the seriousness of her concern with lack of intimacy. Explores with therapist ability to inquire with appropriate supports concern for son. Processes thoughts with therapist and ability to work to process I balance manner. Notes ongoing thoughts of others watching her or judging her in public and can start to sweat in public settings. Shares ability to challenge thoughts which is helpful. Agrees to increase attendance to outings to work to decrease severity of anxiety. Ongoing progress with goals; denies SI/HI  Suicidal/Homicidal: Nowithout intent/plan  Therapist Response: Therapist engaged Denise in tele-therapy session. Assessed for current location and confidentiality of session. Completed check in and assessed for current level of stressors, sxs management and level of stressors. Supported in processing current concerns with relationship and son. Identified maladaptive thinking and discouraged future telling and catastrophizing of concern for son and encouraged to follow up with appropriate providers. Supported in processing concerns for relationship and encouraged clear, open and honest communication and partner's need to follow up with a PCP for concerns with impotence. Engaged in processing anxiety ability to challenge anxious thoughts and working to engage in consistent exposure homework. Reviewed session and provided follow up.  Plan: Return again in  x 2 weeks.  Diagnosis: Generalized anxiety disorder  MDD (major depressive disorder), recurrent episode, moderate (HCC)  Paranoia (psychosis) (HCC)  Collaboration of Care: Other None  Patient/Guardian was advised Release of Information must be obtained prior to  any record  release in order to collaborate their care with an outside provider. Patient/Guardian was advised if they have not already done so to contact the registration department to sign all necessary forms in order for us  to release information regarding their care.   Consent: Patient/Guardian gives verbal consent for treatment and assignment of benefits for services provided during this visit. Patient/Guardian expressed understanding and agreed to proceed.   Carmel Chimes [place], [facility] 03/17/2024

## 2024-03-24 ENCOUNTER — Ambulatory Visit (HOSPITAL_COMMUNITY): Admitting: Mental Health

## 2024-03-24 DIAGNOSIS — F22 Delusional disorders: Secondary | ICD-10-CM

## 2024-03-24 DIAGNOSIS — F411 Generalized anxiety disorder: Secondary | ICD-10-CM

## 2024-03-24 DIAGNOSIS — F331 Major depressive disorder, recurrent, moderate: Secondary | ICD-10-CM | POA: Diagnosis not present

## 2024-03-24 NOTE — Progress Notes (Signed)
 THERAPIST PROGRESS NOTE Virtual Visit via Video Note  I connected with Denise Barker on 03/24/24 at 10:00 AM EDT by a video enabled telemedicine application and verified that I am speaking with the correct person using two identifiers.  Location: Patient: home address on file Provider: home office   I discussed the limitations of evaluation and management by telemedicine and the availability of in person appointments. The patient expressed understanding and agreed to proceed.  I discussed the assessment and treatment plan with the patient. The patient was provided an opportunity to ask questions and all were answered. The patient agreed with the plan and demonstrated an understanding of the instructions.   The patient was advised to call back or seek an in-person evaluation if the symptoms worsen or if the condition fails to improve as anticipated.  I provided 53 minutes of non-face-to-face time during this encounter.   Denise Barker, [facility]   Session Time: 10:00 am ( 53 minutes)  Participation Level: Active  Behavioral Response: CasualAlertDysphoric  Type of Therapy: Individual Therapy  Treatment Goals addressed: RUE:AVWU will increase manage of depression/anxiety AEB development of effective coping skills with ability to reframe maladaptive thinking 7/7 days within the next 90 days.    STG: Denise will increase socialization AEB engagement in community x 1 weekly within the next 90 days.   ProgressTowards Goals: Progressing  Interventions: CBT and Supportive  Summary:  Denise Barker is a 38 y.o. female who presents with dx of major depression moderate, generalized anxiety and cannabis use, paranoia. Denise Barker presents for session alert and oriented; mood and affect adequate.  Speech clear and coherent at normal rate and tone. Shares to be doing ok but notes thoughts of concern for ongoing anxiety. Engaged with therapist in exploring role of avoidance has played in feelings of  anxiety and shares would like to be able to get out more and engage in life. Shares thoughts on interaction with others from work and feeling as if she is not seen or heard. Notes to have had discussion with female partner and for this to have gone well. Identifies with having a tendency to see things in her life that are not going well and will not notice things that are going well. Shares with therapist ways in which she would like to engage more fully in life and shares would like to cook ok Sunday. Engaged with therapist to develop goal to cook on Sunday. Agrees to work on engaging in exposures for working to reduce anxiety. Denies safety concerns, ongoing work towards goals.   Suicidal/Homicidal: Nowithout intent/plan  Therapist Response: Therapist engaged Denise in tele-therapy session. Assessed for current location and confidentiality of session. Completed check in and assessed for current level of stressors, sxs management and level of stressors. Supported in processing current concerns previous engagement with female partner and affirmed working towards increased emotional boundaries. Supported in processing working through feelings of anxiety and desire to increase community engagement and development of friendships. Identified distortions in thoughts and supported in reframing. Provided psycho education on role avoidance plays in anxiety and working to develop new narrative around ability to engage in activities in which she would like to do. Supported in identifying goal to cook on Sunday. Reviewed session and provided follow up.  Plan: Return again in  x 4 weeks.  Diagnosis: Generalized anxiety disorder  MDD (major depressive disorder), recurrent episode, moderate (HCC)  Paranoia (psychosis) (HCC)  Collaboration of Care: Other None  Patient/Guardian was advised Release of  Information must be obtained prior to any record release in order to collaborate their care with an outside provider.  Patient/Guardian was advised if they have not already done so to contact the registration department to sign all necessary forms in order for us  to release information regarding their care.   Consent: Patient/Guardian gives verbal consent for treatment and assignment of benefits for services provided during this visit. Patient/Guardian expressed understanding and agreed to proceed.   Carmel Chimes [place], [facility] 03/24/2024

## 2024-05-12 ENCOUNTER — Ambulatory Visit (HOSPITAL_COMMUNITY): Admitting: Mental Health

## 2024-05-12 DIAGNOSIS — F411 Generalized anxiety disorder: Secondary | ICD-10-CM

## 2024-05-12 DIAGNOSIS — F22 Delusional disorders: Secondary | ICD-10-CM

## 2024-05-12 DIAGNOSIS — F331 Major depressive disorder, recurrent, moderate: Secondary | ICD-10-CM

## 2024-05-12 NOTE — Progress Notes (Signed)
 THERAPIST PROGRESS NOTE Virtual Visit via Video Note  I connected with Denise Barker on 05/12/24 at  8:00 AM EDT by a video enabled telemedicine application and verified that I am speaking with the correct person using two identifiers.  Location: Patient: home address on file Provider: home office   I discussed the limitations of evaluation and management by telemedicine and the availability of in person appointments. The patient expressed understanding and agreed to proceed.  I discussed the assessment and treatment plan with the patient. The patient was provided an opportunity to ask questions and all were answered. The patient agreed with the plan and demonstrated an understanding of the instructions.   The patient was advised to call back or seek an in-person evaluation if the symptoms worsen or if the condition fails to improve as anticipated.  I provided 48 minutes of non-face-to-face time during this encounter.   Ty Bernice Savant, [facility]   Session Time: 8:10 am(  Participation Level: Active  Behavioral Response: CasualAlertDysphoric  Type of Therapy: Individual Therapy  Treatment Goals addressed:  DUH:Jdpj will increase manage of depression/anxiety AEB development of effective coping skills with ability to reframe maladaptive thinking 7/7 days within the next 90 days.    STG: Denise will increase socialization AEB engagement in community x 1 weekly within the next 90 days.   ProgressTowards Goals: Progressing  Interventions: Supportive  Summary:  Denise Barker is a 38 y.o. female who presents with dx of major depression moderate, generalized anxiety and cannabis use, paranoia. [name] presents for session alert and oriented; mood and affect low.  Speech clear and coherent at normal rate and tone. Shares to be doing ok but notes thoughts of body to feel depressed. Shares  thoughts on difficulty completing tasks and at times simple tasks seem difficulty for her to complete.  Shares thoughts on interactions with others and reports of others to state she acts like a guy. Shares thoughts of that statement but is unclear of what was meant. Explores with therapist hx of desire for affection and attention from others. Shares thoughts on self esteem. Agrees desire to work on self-esteem and confidence and internal validation. Explores what it means to love self with therapist. Agrees to work to make list in notes of daily goals to accomplish. Shares socialization with potential female partner and engagement in community. Ongoing work towards goals. Denies safety concerns.   Suicidal/Homicidal: Nowithout intent/plan  Therapist Response: Therapist engaged Denise in tele-therapy session. Assessed for current location and confidentiality of session. Completed check in and assessed for current level of stressors, sxs management and level of stressors. Supported in processing current concerns engagement with female partner desire for external validation, attention and affection. Explored working on sense of self and esteem and explored varied relationships with individuals. Encouraged working to set small daily goals to support in accomplishing tasks as needed. Reviewed session and provided follow up.  Plan: Return again in  x 2 weeks.  Diagnosis: MDD (major depressive disorder), recurrent episode, moderate (HCC)  Generalized anxiety disorder  Paranoia (psychosis) (HCC)  Collaboration of Care: Other None  Patient/Guardian was advised Release of Information must be obtained prior to any record release in order to collaborate their care with an outside provider. Patient/Guardian was advised if they have not already done so to contact the registration department to sign all necessary forms in order for us  to release information regarding their care.   Consent: Patient/Guardian gives verbal consent for treatment and assignment of benefits for  services provided during this visit.  Patient/Guardian expressed understanding and agreed to proceed.   Ty Asal Acton, [facility] 05/12/2024

## 2024-05-23 ENCOUNTER — Telehealth (HOSPITAL_COMMUNITY): Payer: Self-pay

## 2024-05-23 DIAGNOSIS — F331 Major depressive disorder, recurrent, moderate: Secondary | ICD-10-CM

## 2024-05-23 DIAGNOSIS — F4310 Post-traumatic stress disorder, unspecified: Secondary | ICD-10-CM

## 2024-05-23 MED ORDER — ARIPIPRAZOLE 10 MG PO TABS: 10.0000 mg | ORAL_TABLET | Freq: Every day | ORAL | 1 refills | Status: DC

## 2024-05-23 MED ORDER — HYDROXYZINE HCL 25 MG PO TABS: 25.0000 mg | ORAL_TABLET | Freq: Two times a day (BID) | ORAL | 1 refills | Status: DC | PRN

## 2024-05-23 MED ORDER — SERTRALINE HCL 100 MG PO TABS: 100.0000 mg | ORAL_TABLET | Freq: Every day | ORAL | 1 refills | Status: DC

## 2024-05-23 NOTE — Addendum Note (Signed)
 Addended by: MERCY DOMINO A on: 05/23/2024 01:42 PM   Modules accepted: Orders

## 2024-05-23 NOTE — Telephone Encounter (Signed)
 Hello < I've already looked to see if pt has been compliant with med management appointments, last app pt no showed . She is currently requesting a bridge of meds to be sent in until new app with you.

## 2024-05-26 ENCOUNTER — Ambulatory Visit (HOSPITAL_COMMUNITY): Admitting: Mental Health

## 2024-05-26 DIAGNOSIS — F331 Major depressive disorder, recurrent, moderate: Secondary | ICD-10-CM | POA: Diagnosis not present

## 2024-05-26 DIAGNOSIS — F22 Delusional disorders: Secondary | ICD-10-CM

## 2024-05-26 NOTE — Progress Notes (Signed)
THERAPIST PROGRESS NOTE Virtual Visit via Video Note  I connected with Denise Barker on 05/26/24 at  9:00 AM EDT by a video enabled telemedicine application and verified that I am speaking with the correct person using two identifiers.  Location: Patient: home address on file Provider: home office   I discussed the limitations of evaluation and management by telemedicine and the availability of in person appointments. The patient expressed understanding and agreed to proceed.  I discussed the assessment and treatment plan with the patient. The patient was provided an opportunity to ask questions and all were answered. The patient agreed with the plan and demonstrated an understanding of the instructions.   The patient was advised to call back or seek an in-person evaluation if the symptoms worsen or if the condition fails to improve as anticipated.  I provided 53 minutes of non-face-to-face time during this encounter.   Ty Bernice Savant, [facility]   Session Time: 9:05 am   Participation Level: Active  Behavioral Response: Neat and Well GroomedAlertDysphoric  Type of Therapy: Individual Therapy  Treatment Goals addressed: DUH:Jdpj will increase manage of depression/anxiety AEB development of effective coping skills with ability to reframe maladaptive thinking 7/7 days within the next 90 days.    STG: Denise will increase socialization AEB engagement in community x 1 weekly within the next 90 days.   ProgressTowards Goals: Progressing  Interventions: CBT and Supportive  Summary: Denise Barker is a 38 y.o. female who presents with dx of major depression moderate, generalized anxiety and cannabis use, paranoia. Avalina presents for session alert and oriented; mood and affect low.  Speech clear and coherent at normal rate and tone. Shares for moods to have declines and shares to have been off medications for the past week. Shares low mood with ending of relationship and shares with therapist  events that lead to ending of interactions. Explores with therapist self-esteem and confidence issues and working to build confidence and esteem prior to entering female relationships. Agrees to post positive affirmations and read to self daily and show therapist at next appointment. Shares regression in getting out of the house with able to identify not motivated as was able to present out to visit female partner. Denies safety concerns. Ongoing work towards goals.   Suicidal/Homicidal: Nowithout intent/plan  Therapist Response:  Therapist engaged Denise in tele-therapy session. Assessed for current location and confidentiality of session. Completed check in and assessed for current level of stressors, sxs management and level of stressors. Supported in processing current concerns and working to challenging distorted thoughts of self and interactions in relationship. Explored thoughts of self, self-esteem and self-confidence. Explored inner voices and working to increase thoughts of self. Encouraged use of positive affirmations and working to build up intrinsic motivation to increased interaction in community. Reviewed session and provided follow up.   Plan: Return again in x2 weeks.  Diagnosis: MDD (major depressive disorder), recurrent episode, moderate (HCC)  Paranoia (psychosis) (HCC)  Collaboration of Care: Other None  Patient/Guardian was advised Release of Information must be obtained prior to any record release in order to collaborate their care with an outside provider. Patient/Guardian was advised if they have not already done so to contact the registration department to sign all necessary forms in order for us  to release information regarding their care.   Consent: Patient/Guardian gives verbal consent for treatment and assignment of benefits for services provided during this visit. Patient/Guardian expressed understanding and agreed to proceed.   Ty Bernice Savant,  [facility]  05/26/2024  

## 2024-06-02 ENCOUNTER — Encounter (HOSPITAL_BASED_OUTPATIENT_CLINIC_OR_DEPARTMENT_OTHER): Payer: Self-pay

## 2024-06-02 ENCOUNTER — Other Ambulatory Visit: Payer: Self-pay

## 2024-06-02 ENCOUNTER — Emergency Department (HOSPITAL_BASED_OUTPATIENT_CLINIC_OR_DEPARTMENT_OTHER)
Admission: EM | Admit: 2024-06-02 | Discharge: 2024-06-03 | Discharge status: Against Medical Advice | Payer: Self-pay | Attending: Emergency Medicine | Admitting: Emergency Medicine

## 2024-06-02 DIAGNOSIS — Z5321 Procedure and treatment not carried out due to patient leaving prior to being seen by health care provider: Secondary | ICD-10-CM | POA: Insufficient documentation

## 2024-06-02 DIAGNOSIS — S61211A Laceration without foreign body of left index finger without damage to nail, initial encounter: Secondary | ICD-10-CM | POA: Insufficient documentation

## 2024-06-02 DIAGNOSIS — W260XXA Contact with knife, initial encounter: Secondary | ICD-10-CM | POA: Insufficient documentation

## 2024-06-02 NOTE — ED Triage Notes (Signed)
 Pt reports accidentally cutting L pointer finger with knife trying to open a locked door at home. Pt has 1.5 cm laceration to L pointer.

## 2024-06-03 ENCOUNTER — Encounter (HOSPITAL_BASED_OUTPATIENT_CLINIC_OR_DEPARTMENT_OTHER): Payer: Self-pay | Admitting: Emergency Medicine

## 2024-06-03 ENCOUNTER — Emergency Department (HOSPITAL_BASED_OUTPATIENT_CLINIC_OR_DEPARTMENT_OTHER)
Admission: EM | Admit: 2024-06-03 | Discharge: 2024-06-03 | Disposition: A | Discharge status: Home / Self Care | Payer: Self-pay | Attending: Emergency Medicine | Admitting: Emergency Medicine

## 2024-06-03 DIAGNOSIS — S61211A Laceration without foreign body of left index finger without damage to nail, initial encounter: Secondary | ICD-10-CM | POA: Insufficient documentation

## 2024-06-03 DIAGNOSIS — Z23 Encounter for immunization: Secondary | ICD-10-CM | POA: Insufficient documentation

## 2024-06-03 DIAGNOSIS — W260XXA Contact with knife, initial encounter: Secondary | ICD-10-CM | POA: Insufficient documentation

## 2024-06-03 MED ORDER — TETANUS-DIPHTH-ACELL PERTUSSIS 5-2.5-18.5 LF-MCG/0.5 IM SUSY
0.5000 mL | PREFILLED_SYRINGE | Freq: Once | INTRAMUSCULAR | Status: AC
  Administered 2024-06-03: 0.5 mL via INTRAMUSCULAR
  Filled 2024-06-03: qty 0.5

## 2024-06-03 MED ORDER — LIDOCAINE-EPINEPHRINE (PF) 2 %-1:200000 IJ SOLN
10.0000 mL | Freq: Once | INTRAMUSCULAR | Status: AC
  Administered 2024-06-03: 10 mL
  Filled 2024-06-03: qty 20

## 2024-06-03 NOTE — ED Triage Notes (Signed)
 Pt caox4 ambulatory c/o laceration to L pointer finger which occurred last night around 1830 from kitchen knife. Last TDAP 09/2019.

## 2024-06-03 NOTE — Discharge Instructions (Signed)
 Please keep dressing in place.  Have this checked visually at least once a day.  Return if you are having any redness, swelling, pus or increased pain. Have this rechecked by your doctor next week.  The current plan is keep the sutures in 10 to 14 days and splint in place due to risk of reopening wound with finger movement.

## 2024-06-03 NOTE — ED Provider Notes (Signed)
 [place] EMERGENCY DEPARTMENT AT [facility] Provider Note   CSN: [id number] Arrival date & time: 06/03/24  9245     Patient presents with: Laceration   Denise Barker is a 38 y.o. female.   HPI 38 year old female right-hand-dominant with laceration to left finger 12 hours prior to evaluation.  She states she cut it with a clean knife.  It is on the dorsal aspect between the PIP and DIP joint.  Tetanus is 38 years old.  She is not having any numbness tingling or difficulty moving.  She went to work last night due to long weights.  She denies history of diabetes.    Prior to Admission medications   Medication Sig Start Date End Date Taking? Authorizing Provider  albuterol  (VENTOLIN  HFA) 108 (90 Base) MCG/ACT inhaler Inhale 1-2 puffs into the lungs every 6 (six) hours as needed for wheezing or shortness of breath. Patient not taking: Reported on 06/16/2023 08/20/22   [name], PA-C  ARIPiprazole  (ABILIFY ) 10 MG tablet Take 1 tablet (10 mg total) by mouth daily. 05/23/24 07/22/24  Bahraini, [name]  cetirizine  (ZYRTEC  ALLERGY) 10 MG tablet Take 1 tablet (10 mg total) by mouth daily. Patient not taking: Reported on 08/31/2022 08/20/22   [name], PA-C  hydrOXYzine  (ATARAX ) 25 MG tablet Take 1-2 tablets (25-50 mg total) by mouth 2 (two) times daily as needed for anxiety. 05/23/24   Bahraini, [name]  levothyroxine (SYNTHROID) 25 MCG tablet Take 25 mcg by mouth daily. Patient not taking: Reported on 06/16/2023 08/28/22   [provider]  OVER THE COUNTER MEDICATION Prematine mist - once weekly Patient not taking: Reported on 06/16/2023    [provider]  sertraline  (ZOLOFT ) 100 MG tablet Take 1 tablet (100 mg total) by mouth daily. 05/23/24 07/22/24  Bahraini, [name]  spironolactone (ALDACTONE) 50 MG tablet Take 1 tablet by mouth daily. Patient not taking: Reported on 06/16/2023 10/09/22   [provider]  topiramate (TOPAMAX) 50 MG tablet Take 50 mg by mouth  daily.    [provider]    Allergies: Patient has no known allergies.    Review of Systems  Updated Vital Signs BP (!) 137/94 (BP Location: Right Wrist)   Pulse 90   Temp 98.6 F (37 C)   Resp 18   Ht 1.778 m (5' 10)   Wt (!) 149.7 kg   SpO2 94%   BMI 47.35 kg/m   Physical Exam Vitals and nursing note reviewed.  Constitutional:      General: She is not in acute distress.    Appearance: She is well-developed.  HENT:     Head: Normocephalic and atraumatic.     Right Ear: External ear normal.     Left Ear: External ear normal.     Nose: Nose normal.  Eyes:     Conjunctiva/sclera: Conjunctivae normal.     Pupils: Pupils are equal, round, and reactive to light.  Pulmonary:     Effort: Pulmonary effort is normal.  Musculoskeletal:        General: Normal range of motion.     Cervical back: Normal range of motion and neck supple.     Comments: Left index finger dorsal laceration 2.5 cm between PIP and DIP joint. Wound explored with no evidence of foreign body or tendon involvement Full flexion and extension of finger throughout. Finger is pink with good capillary refill Sensation is intact  Skin:    General: Skin is warm and dry.  Neurological:  Mental Status: She is alert and oriented to person, place, and time.     Motor: No abnormal muscle tone.     Coordination: Coordination normal.  Psychiatric:        Behavior: Behavior normal.        Thought Content: Thought content normal.     (all labs ordered are listed, but only abnormal results are displayed) Labs Reviewed - No data to display  EKG: None  Radiology: No results found.   .Laceration Repair  Date/Time: 06/03/2024 9:02 AM  Performed by: [name], MD Authorized by: [name], MD   Consent:    Consent obtained:  Verbal   Consent given by:  Patient   Risks, benefits, and alternatives were discussed: yes     Risks discussed:  Infection   Alternatives discussed:  Delayed  treatment Universal protocol:    Patient identity confirmed:  Verbally with patient Anesthesia:    Anesthesia method:  Local infiltration   Local anesthetic:  Lidocaine  1% WITH epi Laceration details:    Location:  Finger   Length (cm):  2.5 Pre-procedure details:    Preparation:  Patient was prepped and draped in usual sterile fashion Exploration:    Limited defect created (wound extended): no   Treatment:    Area cleansed with:  Saline   Amount of cleaning:  Extensive   Irrigation solution:  Sterile saline   Irrigation method:  Pressure wash   Visualized foreign bodies/material removed: no     Debridement:  None   Layers/structures repaired:  Deep subcutaneous Deep subcutaneous:    Wound subcutaneous closure material used: prolene. Skin repair:    Repair method:  Sutures   Suture size:  4-0 and 5-0   Suture material:  Prolene   Suture technique:  Simple interrupted   Number of sutures:  2 Repair type:    Repair type:  Intermediate Post-procedure details:    Dressing:  Sterile dressing   Procedure completion:  Tolerated    Medications Ordered in the ED  lidocaine -EPINEPHrine  (XYLOCAINE  W/EPI) 2 %-1:200000 (PF) injection 10 mL (10 mLs Infiltration Given 06/03/24 0829)  Tdap (BOOSTRIX ) injection 0.5 mL (0.5 mLs Intramuscular Given 06/03/24 0829)                                    Medical Decision Making Risk Prescription drug management.   38 year old female with finger laceration that was 12 to 13 hours and age.  Discussed risks of infection.  However, wound is fairly deep through subcutaneous tissue.  There is no tendon involvement.  Plan to have loose closure with 2 sutures as performed.  Plan to place patient in splint and bulky dressing.  She is advised to have this checked by her primary care physician on Monday.  Plan to leave the sutures in for 10 to 14 days due to risk of dehiscence with flexion.  Discussed signs and symptoms of infection that should prompt  immediate return.  Patient voices understanding.     Final diagnoses:  Laceration of left index finger without foreign body without damage to nail, initial encounter    ED Discharge Orders     None          [name], MD 06/03/24 [phone]

## 2024-06-05 ENCOUNTER — Telehealth: Payer: Self-pay

## 2024-06-05 NOTE — Transitions of Care (Post Inpatient/ED Visit) (Signed)
   06/05/2024  Name: Denise Barker MRN: [id number] DOB: 1986/09/24  Today's TOC FU Call Status: Today's TOC FU Call Status:: Successful TOC FU Call Completed TOC FU Call Complete Date: 06/05/24 Patient's Name and Date of Birth confirmed.  Transition Care Management Follow-up Telephone Call Date of Discharge: 06/03/24 Discharge Facility: [facility] ([facility]) Type of Discharge: Emergency Department Any questions or concerns?: No  Items Reviewed:    Medications Reviewed Today: Medications Reviewed Today   Medications were not reviewed in this encounter     Home Care and Equipment/Supplies:    Functional Questionnaire:    Follow up appointments reviewed: PCP Follow-up appointment confirmed?: NA (pt states she has new pcp and unable to recall name. will call her pcp for follow up.)    SIGNATURE: Naliah Eddington, CMA

## 2024-06-09 ENCOUNTER — Ambulatory Visit (INDEPENDENT_AMBULATORY_CARE_PROVIDER_SITE_OTHER): Payer: Self-pay | Admitting: Mental Health

## 2024-06-09 DIAGNOSIS — F331 Major depressive disorder, recurrent, moderate: Secondary | ICD-10-CM

## 2024-06-09 DIAGNOSIS — F4310 Post-traumatic stress disorder, unspecified: Secondary | ICD-10-CM

## 2024-06-09 DIAGNOSIS — F411 Generalized anxiety disorder: Secondary | ICD-10-CM

## 2024-06-09 NOTE — Progress Notes (Signed)
 THERAPIST PROGRESS NOTE Virtual Visit via Video Note  I connected with Denise Barker on 06/09/24 at  8:00 AM EDT by a video enabled telemedicine application and verified that I am speaking with the correct person using two identifiers.  Location: Patient: home address on file Provider: home office   I discussed the limitations of evaluation and management by telemedicine and the availability of in person appointments. The patient expressed understanding and agreed to proceed.  I discussed the assessment and treatment plan with the patient. The patient was provided an opportunity to ask questions and all were answered. The patient agreed with the plan and demonstrated an understanding of the instructions.   The patient was advised to call back or seek an in-person evaluation if the symptoms worsen or if the condition fails to improve as anticipated.  I provided 48 minutes of non-face-to-face time during this encounter.   Ty Bernice Savant, [facility]   Session Time: 8:03 am ( 48 minutes)  Participation Level: Active  Behavioral Response: CasualAlertWNL  Type of Therapy: Individual Therapy  Treatment Goals addressed: DUH:Jdpj will increase manage of depression/anxiety AEB development of effective coping skills with ability to reframe maladaptive thinking 7/7 days within the next 90 days.    STG: Denise will increase socialization AEB engagement in community x 1 weekly within the next 90 days.   ProgressTowards Goals: Progressing  Interventions: CBT and Supportive  Summary: Denise Engh is a 38 y.o. female who presents with dx of major depression moderate, generalized anxiety and cannabis use, paranoia. Tashaya presents for session alert and oriented; mood and affect adequate; neutral.  Speech clear and coherent at normal rate and tone. Shares for moods to have not been bad with ability to restart medications. Shares has been working on self-esteem and confidence. Denies completion of  homework but will work on prior ot next session. Shares ongoing anxiety with community engagement and explores with therapist factors that will reduce anxiety in public and will start to take self on a date x 1 weekly. Able to reframe negative and distorted thoughts with prompt from therapist independently. Explores thoughts of interactions with females and thoughts on mother wound and working on people pleasing behaviors. Denies safety concerns. Progress with goals.   Suicidal/Homicidal: Nowithout intent/plan  Therapist Response:  Therapist engaged Denise in tele-therapy session. Assessed for current location and confidentiality of session. Completed check in and assessed for current level of stressors, sxs management and level of functioning. Explored barriers for not completing positive affirmation homework exercise. Explored thoughts on self-esteem and confidence and connection to social anxiety and relationship with others. Processed people pleasing behaviors and working to engage with others in assertive manner. Supported in identifying negative thinking. Encouraged going on self-dates weekly and being consistent with engagement. Reviewed session and provided follow up.  Plan: Return again in  x 4 weeks.  Diagnosis: Generalized anxiety disorder  MDD (major depressive disorder), recurrent episode, moderate (HCC)  PTSD (post-traumatic stress disorder)  Collaboration of Care: Other None  Patient/Guardian was advised Release of Information must be obtained prior to any record release in order to collaborate their care with an outside provider. Patient/Guardian was advised if they have not already done so to contact the registration department to sign all necessary forms in order for us  to release information regarding their care.   Consent: Patient/Guardian gives verbal consent for treatment and assignment of benefits for services provided during this visit. Patient/Guardian expressed understanding  and agreed to proceed.   Ty,  Bernice Savant, [facility] 06/09/2024

## 2024-06-16 ENCOUNTER — Emergency Department (HOSPITAL_BASED_OUTPATIENT_CLINIC_OR_DEPARTMENT_OTHER)
Admission: EM | Admit: 2024-06-16 | Discharge: 2024-06-16 | Disposition: A | Discharge status: Home / Self Care | Payer: Self-pay | Attending: Emergency Medicine | Admitting: Emergency Medicine

## 2024-06-16 ENCOUNTER — Other Ambulatory Visit: Payer: Self-pay

## 2024-06-16 DIAGNOSIS — Z4802 Encounter for removal of sutures: Secondary | ICD-10-CM | POA: Insufficient documentation

## 2024-06-16 NOTE — Discharge Instructions (Addendum)
 You had your sutures removed today.   Please keep the area clean with soap and water.  You may return to normal activities.  Return to the ER for any other new or emergent concerns.

## 2024-06-16 NOTE — ED Provider Notes (Signed)
 [place] EMERGENCY DEPARTMENT AT [facility] Provider Note   CSN: [id number] Arrival date & time: 06/16/24  9142     Patient presents with: Suture / Staple Removal   Denise Barker is a 38 y.o. female who presents with request for suture removal.  She had 2 sutures placed in her left index finger on 06/03/2024.  Denies any concerns with this area.  Denies any drainage from the laceration site.  Denies any difficulties moving her finger or any loss of sensation in the finger.    Suture / Staple Removal       Prior to Admission medications   Medication Sig Start Date End Date Taking? Authorizing Provider  albuterol  (VENTOLIN  HFA) 108 (90 Base) MCG/ACT inhaler Inhale 1-2 puffs into the lungs every 6 (six) hours as needed for wheezing or shortness of breath. Patient not taking: Reported on 06/16/2023 08/20/22   [name], PA-C  ARIPiprazole  (ABILIFY ) 10 MG tablet Take 1 tablet (10 mg total) by mouth daily. 05/23/24 07/22/24  Bahraini, [name]  cetirizine  (ZYRTEC  ALLERGY) 10 MG tablet Take 1 tablet (10 mg total) by mouth daily. Patient not taking: Reported on 08/31/2022 08/20/22   [name], PA-C  hydrOXYzine  (ATARAX ) 25 MG tablet Take 1-2 tablets (25-50 mg total) by mouth 2 (two) times daily as needed for anxiety. 05/23/24   Bahraini, [name]  levothyroxine (SYNTHROID) 25 MCG tablet Take 25 mcg by mouth daily. Patient not taking: Reported on 06/16/2023 08/28/22   [provider]  OVER THE COUNTER MEDICATION Prematine mist - once weekly Patient not taking: Reported on 06/16/2023    [provider]  sertraline  (ZOLOFT ) 100 MG tablet Take 1 tablet (100 mg total) by mouth daily. 05/23/24 07/22/24  Bahraini, [name]  spironolactone (ALDACTONE) 50 MG tablet Take 1 tablet by mouth daily. Patient not taking: Reported on 06/16/2023 10/09/22   [provider]  topiramate (TOPAMAX) 50 MG tablet Take 50 mg by mouth daily.    [provider]    Allergies: Patient  has no known allergies.    Review of Systems  Skin:  Positive for wound.    Updated Vital Signs SpO2 97%   Physical Exam Vitals and nursing note reviewed.  Constitutional:      Appearance: Normal appearance.  HENT:     Head: Atraumatic.  Cardiovascular:     Comments: Cap refill less than 2 seconds in the left index finger Pulmonary:     Effort: Pulmonary effort is normal.  Musculoskeletal:     Comments: Left Index Finger: General 2 simple interrupted sutures in place.  Laceration site well-approximated.  No erythema or edema.  No purulent drainage from the area.   Palpation Non-tender over the proximal, middle, or distal phalanx  ROM Full flexion and extension at the left index finger MCP, PIP, DIP  Sensation: Sensation intact throughout the left index finger   Neurological:     General: No focal deficit present.     Mental Status: She is alert.  Psychiatric:        Mood and Affect: Mood normal.        Behavior: Behavior normal.     (all labs ordered are listed, but only abnormal results are displayed) Labs Reviewed - No data to display  EKG: None  Radiology: No results found.   Suture Removal  Date/Time: 06/16/2024 9:33 AM  Performed by: Veta Palma, PA-C Authorized by: Veta Palma, PA-C   Consent:    Consent obtained:  Verbal  Consent given by:  Patient   Risks, benefits, and alternatives were discussed: yes     Risks discussed:  Bleeding and pain   Alternatives discussed:  No treatment Universal protocol:    Patient identity confirmed:  Verbally with patient Location:    Location: Left index finger. Procedure details:    Wound appearance:  No signs of infection, clean and good wound healing   Number of sutures removed:  2 Post-procedure details:    Post-removal:  No dressing applied   Procedure completion:  Tolerated well, no immediate complications    Medications Ordered in the ED - No data to display                                   Medical Decision Making    Differential diagnosis includes but is not limited to wound dehiscence, cellulitis, appropriate wound healing   ED Course:  Upon initial evaluation, patient is well appearing. Left index finger laceration is well healed, no signs of infection. Full range of motion of the left index finger and neurovascularly intact. Sutures removed without difficulty, patient tolerated procedure well.   Impression: Suture Removal  Disposition:  The patient was discharged home with instructions to  Return precautions given.    Record Review: External records from outside source obtained and reviewed including ER note from 06/03/24     This chart was dictated using voice recognition software, Dragon. Despite the best efforts of this provider to proofread and correct errors, errors may still occur which can change documentation meaning.       Final diagnoses:  Visit for suture removal    ED Discharge Orders     None          Veta Palma, PA-C 06/16/24 0940    Ruthe Cornet, DO 06/16/24 [phone]

## 2024-07-07 ENCOUNTER — Ambulatory Visit (HOSPITAL_COMMUNITY): Admitting: Mental Health

## 2024-07-07 DIAGNOSIS — F331 Major depressive disorder, recurrent, moderate: Secondary | ICD-10-CM

## 2024-07-07 DIAGNOSIS — F411 Generalized anxiety disorder: Secondary | ICD-10-CM
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
## 2024-07-07 NOTE — Progress Notes (Signed)
 THERAPIST PROGRESS NOTE Virtual Visit via Video Note  I connected with Denise Barker on 07/07/24 at  8:00 AM EDT by a video enabled telemedicine application and verified that I am speaking with the correct person using two identifiers.  Location: Patient: home address on file Provider: remote office   I discussed the limitations of evaluation and management by telemedicine and the availability of in person appointments. The patient expressed understanding and agreed to proceed. I discussed the assessment and treatment plan with the patient. The patient was provided an opportunity to ask questions and all were answered. The patient agreed with the plan and demonstrated an understanding of the instructions.   The patient was advised to call back or seek an in-person evaluation if the symptoms worsen or if the condition fails to improve as anticipated.  I provided 53 minutes of non-face-to-face time during this encounter.   Ty Bernice Savant, [facility]   Session Time: 8:05 am (   Participation Level: Active  Behavioral Response: CasualAlertEuthymic  Type of Therapy: Individual Therapy  Treatment Goals addressed:  DUH:Jdpj will increase manage of depression/anxiety AEB development of effective coping skills with ability to reframe maladaptive thinking 7/7 days within the next 90 days.    STG: Denise will increase socialization AEB engagement in community x 1 weekly within the next 90 days.   ProgressTowards Goals: Progressing  Interventions: CBT and Supportive  Summary:  Denise Barker is a 38 y.o. female who presents with dx of major depression moderate, generalized anxiety and cannabis use, paranoia. [place] presents for session alert and oriented; mood and affect adequate; neutral.  Speech clear and coherent at normal rate and tone. Shares for moods to have good for the most part Shares thoughts on interaction with others and feelings of anxiety. Shares presentation in community with  spending time with friends and to have enjoyed self. Explores thoughts of building self-esteem and confidence and role in depression. Shares thoughts of anxiety with presenting to [facility] and explored ability to present with increase level of comfort. Notes distorted thoughts of others looking at her and thinking of her awkwardness.  Shares has not engaged in positive affirmation exercise but has purchased items to support in completions.   Suicidal/Homicidal: Nowithout intent/plan  Therapist Response: Therapist engaged Denise in tele-therapy session. Assessed for current location and confidentiality of session. Completed check in and assessed for current level of stressors, sxs management and level of functioning. Explored barriers for not completing positive affirmation homework exercise. Explored thoughts on self-esteem and confidence and role plays in anxiety and feelings of depression. Affirmed ability to engage with co-workers and Quarry manager of friendships. Engaged in challenging distortions in thought and working to increase engagement in community and interactions with others. Encouraged ongoing work towards completion of positive affirmations exercises. Reviewed session and provided follow up  Plan: Return again in  x 2 weeks.  Diagnosis: MDD (major depressive disorder), recurrent episode, moderate (HCC)  Generalized anxiety disorder  Collaboration of Care: Other None  Patient/Guardian was advised Release of Information must be obtained prior to any record release in order to collaborate their care with an outside provider. Patient/Guardian was advised if they have not already done so to contact the registration department to sign all necessary forms in order for us  to release information regarding their care.   Consent: Patient/Guardian gives verbal consent for treatment and assignment of benefits for services provided during this visit. Patient/Guardian expressed understanding and  agreed to proceed.   Ty Bernice  Rojean, [facility] 07/07/2024

## 2024-07-10 ENCOUNTER — Encounter (HOSPITAL_COMMUNITY): Payer: Self-pay | Admitting: Psychiatry

## 2024-07-10 ENCOUNTER — Telehealth (INDEPENDENT_AMBULATORY_CARE_PROVIDER_SITE_OTHER): Admitting: Psychiatry

## 2024-07-10 DIAGNOSIS — F1290 Cannabis use, unspecified, uncomplicated: Secondary | ICD-10-CM

## 2024-07-10 DIAGNOSIS — F411 Generalized anxiety disorder: Secondary | ICD-10-CM

## 2024-07-10 DIAGNOSIS — F4310 Post-traumatic stress disorder, unspecified: Secondary | ICD-10-CM | POA: Diagnosis not present

## 2024-07-10 DIAGNOSIS — F330 Major depressive disorder, recurrent, mild: Secondary | ICD-10-CM | POA: Diagnosis not present

## 2024-07-10 MED ORDER — ARIPIPRAZOLE 10 MG PO TABS: 10.0000 mg | ORAL_TABLET | Freq: Every day | ORAL | 3 refills | Status: AC

## 2024-07-10 MED ORDER — HYDROXYZINE HCL 25 MG PO TABS: 25.0000 mg | ORAL_TABLET | Freq: Two times a day (BID) | ORAL | 3 refills | Status: AC | PRN

## 2024-07-10 MED ORDER — SERTRALINE HCL 100 MG PO TABS: 100.0000 mg | ORAL_TABLET | Freq: Every day | ORAL | 3 refills | Status: AC

## 2024-07-21 ENCOUNTER — Ambulatory Visit (INDEPENDENT_AMBULATORY_CARE_PROVIDER_SITE_OTHER): Admitting: Mental Health

## 2024-07-21 DIAGNOSIS — F4310 Post-traumatic stress disorder, unspecified: Secondary | ICD-10-CM

## 2024-07-21 DIAGNOSIS — F411 Generalized anxiety disorder: Secondary | ICD-10-CM

## 2024-07-21 DIAGNOSIS — F330 Major depressive disorder, recurrent, mild: Secondary | ICD-10-CM

## 2024-07-21 NOTE — Progress Notes (Signed)
   THERAPIST PROGRESS NOTE Virtual Visit via Video Note  I connected with Denise Barker on 07/21/24 at  8:00 AM EDT by a video enabled telemedicine application and verified that I am speaking with the correct person using two identifiers.  Location: Patient: home address on file Provider: remote office   I discussed the limitations of evaluation and management by telemedicine and the availability of in person appointments. The patient expressed understanding and agreed to proceed.  I discussed the assessment and treatment plan with the patient. The patient was provided an opportunity to ask questions and all were answered. The patient agreed with the plan and demonstrated an understanding of the instructions.   The patient was advised to call back or seek an in-person evaluation if the symptoms worsen or if the condition fails to improve as anticipated.  I provided 51 minutes of non-face-to-face time during this encounter.   Ty Bernice Savant, [facility]   Session Time: 8:04 am (  Participation Level: Active  Behavioral Response: CasualAlertAdequate  Type of Therapy: Individual Therapy  Treatment Goals addressed: DUH:Jdpj will increase manage of depression/anxiety AEB development of effective coping skills with ability to reframe maladaptive thinking 7/7 days within the next 90 days.    STG: Denise will increase socialization AEB engagement in community x 1 weekly within the next 90 days.   ProgressTowards Goals: Progressing  Interventions: CBT and Supportive  Summary:  Denise Barker is a 38 y.o. female who presents with dx of major depression moderate, generalized anxiety and cannabis use, paranoia. Geneen presents for session alert and oriented; mood and affect adequate; stable.  Speech clear and coherent at normal rate and tone. Shares for moods to have pretty good Shares thoughts on interaction with female relationships and ability to navigate moving on from them as needed. Shares  events in which she was able to manage emotions. Shares things that are going well with son and explores thoughts on son's progress and concerns for diagnosis. Reports some improvement with anxiety however continues to be present, notes increased socialization with others and upcoming plans. Explores with therapist thoughts on self-esteem and confidence and reflection of self. Denies safety concerns. Progress with goals.  Suicidal/Homicidal: Nowithout intent/plan  Therapist Response: Therapist engaged Denise in tele-therapy session. Assessed for current location and confidentiality of session. Completed check in and assessed for current level of stressors, sxs management and level of functioning. Supported in Licensed conveyancer to navigate female relationships and explores ability to hold emotional boundaries with self and not becoming consumed. Explored thoughts on self-esteem and self-confidence and ways in which this is reflected in ones behaviors. Supported in restructuring thoughts of son and assumptions made on autism dx. Encouraged ongoing engagement outside of the home and working to completed needed tasks. Reviewed session and provided follow up. Encouraged to to continue to work to complete affirmation exercise.  Plan: Return again in  x 4 weeks.  Diagnosis: No diagnosis found.  Collaboration of Care: Other None  Patient/Guardian was advised Release of Information must be obtained prior to any record release in order to collaborate their care with an outside provider. Patient/Guardian was advised if they have not already done so to contact the registration department to sign all necessary forms in order for us  to release information regarding their care.   Consent: Patient/Guardian gives verbal consent for treatment and assignment of benefits for services provided during this visit. Patient/Guardian expressed understanding and agreed to proceed.   Ty Bernice [place], [facility] 07/21/2024

## 2024-08-14 ENCOUNTER — Ambulatory Visit (INDEPENDENT_AMBULATORY_CARE_PROVIDER_SITE_OTHER): Admitting: Mental Health

## 2024-08-14 DIAGNOSIS — F411 Generalized anxiety disorder: Secondary | ICD-10-CM

## 2024-08-14 DIAGNOSIS — F330 Major depressive disorder, recurrent, mild: Secondary | ICD-10-CM

## 2024-08-14 NOTE — Progress Notes (Unsigned)
 THERAPIST PROGRESS NOTE Virtual Visit via Video Note  I connected with Denise Barker on 08/14/24 at  8:00 AM EDT by a video enabled telemedicine application and verified that I am speaking with the correct person using two identifiers.  Location: Patient: home address on file Provider: remote office   I discussed the limitations of evaluation and management by telemedicine and the availability of in person appointments. The patient expressed understanding and agreed to proceed.   I discussed the assessment and treatment plan with the patient. The patient was provided an opportunity to ask questions and all were answered. The patient agreed with the plan and demonstrated an understanding of the instructions.   The patient was advised to call back or seek an in-person evaluation if the symptoms worsen or if the condition fails to improve as anticipated.  I provided 48 minutes of non-face-to-face time during this encounter.   Ty Bernice Savant, [facility]   Session Time: 8:03 am ( 48 minutes)  Participation Level: Active  Behavioral Response: CasualAlertDysphoric  Type of Therapy: Individual Therapy  Treatment Goals addressed:   DUH:Jdpj will increase manage of depression/anxiety AEB development of effective coping skills with ability to reframe maladaptive thinking 7/7 days within the next 90 days.    STG: [name] will increase socialization AEB engagement in community x 1 weekly within the next 90 days.  ProgressTowards Goals: Progressing  Interventions: CBT and Supportive  Summary: Denise Barker is a 38 y.o. female who presents with dx of major depression moderate, generalized anxiety and cannabis use, paranoia. Latrease presents for session alert and oriented; mood and affect low.  Speech clear and coherent at normal rate and tone. Shares for moods to have low but working to cope with use of positive self talk and engaging in balanced thoughts. Shares supports from friends has also been  helpful in managing. Shares current stressor triggering increase in low mood of losing employment. Shares has already engaged in action behaviors of updating resume and notes to have several applications for employment completed. Engages with therapist of concern for remote. Shares some worry but working to manage appropriately. Shares concerns of discussing being let go with mother and shares hx of mother's reactions. Shares has been engaging in community at higher rate of at least x 1 weekly and shares events with therapist. [name] upcoming event in which she plans to attend. Ongoing work towards goals with progress. Denies safety concerns.   Suicidal/Homicidal: Nowithout intent/plan  Therapist Response: Therapist engaged [name] in tele-therapy session. Assessed for current location and confidentiality of session. Completed check in and assessed for current level of stressors, sxs management and level of functioning. Supported in processing recent changes with employment status. Provided support and encouragement; validated feelings. Engages in exploring factors that have been supported in working to manage moods. Explored ability to hold unpleasant feelings and ability to cope and engage in action based solution focused steps. Affirmed ability to engage in challenging negative thinking and distortions in thought about what employment status change means. Engaged in discussion of concern for remote work and possible regression in anxiety. Explored ability to continue to engage in community and increase socialization. Reviewed session and provided follow up.  Plan: Return again in  x 4 weeks.  Diagnosis: MDD (major depressive disorder), recurrent episode, mild  Generalized anxiety disorder  Collaboration of Care: Other None  Patient/Guardian was advised Release of Information must be obtained prior to any record release in order to collaborate their care with an outside  provider. Patient/Guardian was  advised if they have not already done so to contact the registration department to sign all necessary forms in order for us  to release information regarding their care.   Consent: Patient/Guardian gives verbal consent for treatment and assignment of benefits for services provided during this visit. Patient/Guardian expressed understanding and agreed to proceed.   Ty Asal [place], [facility] 08/14/2024

## 2024-09-01 ENCOUNTER — Ambulatory Visit (INDEPENDENT_AMBULATORY_CARE_PROVIDER_SITE_OTHER): Admitting: Mental Health

## 2024-09-01 ENCOUNTER — Encounter (HOSPITAL_COMMUNITY): Payer: Self-pay

## 2024-09-01 DIAGNOSIS — F22 Delusional disorders: Secondary | ICD-10-CM

## 2024-09-01 DIAGNOSIS — F330 Major depressive disorder, recurrent, mild: Secondary | ICD-10-CM

## 2024-09-01 DIAGNOSIS — F411 Generalized anxiety disorder: Secondary | ICD-10-CM | POA: Diagnosis not present

## 2024-09-01 NOTE — Progress Notes (Signed)
   THERAPIST PROGRESS NOTE Virtual Visit via Video Note  I connected with Denise Barker on 09/01/24 at  8:00 AM EST by a video enabled telemedicine application and verified that I am speaking with the correct person using two identifiers.  Location: Patient: home address on file Provider: remote office   I discussed the limitations of evaluation and management by telemedicine and the availability of in person appointments. The patient expressed understanding and agreed to proceed.  I discussed the assessment and treatment plan with the patient. The patient was provided an opportunity to ask questions and all were answered. The patient agreed with the plan and demonstrated an understanding of the instructions.   The patient was advised to call back or seek an in-person evaluation if the symptoms worsen or if the condition fails to improve as anticipated.  I provided 53 minutes of non-face-to-face time during this encounter.   Denise Barker, [facility]   Session Time: 8:03 am ( 53 minutes)  Participation Level: Active  Behavioral Response: CasualAlertEuthymic  Type of Therapy: Individual Therapy  Treatment Goals addressed: STG: AnxietyAsia increase management of anxiety AEB ability to reframe anxious distorted thoughts with ability to engage in community at least x 1 daily for a period of 90 days.      ProgressTowards Goals: Progressing  Interventions: CBT and Supportive  Summary:  Denise Barker is a 38 y.o. female who presents with dx of major depression moderate, generalized anxiety and cannabis use, paranoia. Denise Barker presents for session alert and oriented; mood and affect adequate; stable.  Speech clear and coherent at normal rate and tone. Shares for moods to stable in good moods. Shares ability to secure new positions and shares presence of anxieties of new employment. Shares doing well not feeding into paranoid thoughts  I keep the door closed on that. Shares reduction in  engagement in community and shares increase in anxiety with difficulty doing things in which she needs to do. Explores with therapist factors that contribute to increase in social anxiety and adjustment in treatment plan. Discussed updated treatment plan. Denies safety concerns.   Suicidal/Homicidal: Nowithout intent/plan  Therapist Response: Therapist engaged Denise Barker in tele-therapy session. Assessed for current location and confidentiality of session. Completed check in and assessed for current level of stressors, sxs management and level of functioning. Supported in processing recent changes with employment status. Provided support and encouragement; validated feelings. Engages in exploring factors contributing to reported anxious thoughts concerning employment and supported in reframing thoughts and challenging anxious thoughts. Identified factors contributing to increase in anxiety and explored alterations in treatment plan. Reviewed session and provided follow up  Plan: Return again in  x 4 weeks.  Diagnosis: Generalized anxiety disorder  MDD (major depressive disorder), recurrent episode, mild  Paranoia (psychosis) (HCC)  Collaboration of Care: Other None  Patient/Guardian was advised Release of Information must be obtained prior to any record release in order to collaborate their care with an outside provider. Patient/Guardian was advised if they have not already done so to contact the registration department to sign all necessary forms in order for us  to release information regarding their care.   Consent: Patient/Guardian gives verbal consent for treatment and assignment of benefits for services provided during this visit. Patient/Guardian expressed understanding and agreed to proceed.   Denise Barker, [facility] 09/01/2024

## 2024-09-29 ENCOUNTER — Ambulatory Visit (INDEPENDENT_AMBULATORY_CARE_PROVIDER_SITE_OTHER): Admitting: Mental Health

## 2024-09-29 DIAGNOSIS — F411 Generalized anxiety disorder: Secondary | ICD-10-CM

## 2024-09-29 DIAGNOSIS — F330 Major depressive disorder, recurrent, mild: Secondary | ICD-10-CM

## 2024-09-29 NOTE — Progress Notes (Unsigned)
° °  THERAPIST PROGRESS NOTE Virtual Visit via Video Note  I connected with Denise Barker on 09/29/2024 at  8:00 AM EST by a video enabled telemedicine application and verified that I am speaking with the correct person using two identifiers.  Location: Patient: home address on file Provider: remote office   I discussed the limitations of evaluation and management by telemedicine and the availability of in person appointments. The patient expressed understanding and agreed to proceed.  I discussed the assessment and treatment plan with the patient. The patient was provided an opportunity to ask questions and all were answered. The patient agreed with the plan and demonstrated an understanding of the instructions.   The patient was advised to call back or seek an in-person evaluation if the symptoms worsen or if the condition fails to improve as anticipated.  I provided 55 minutes of non-face-to-face time during this encounter.   Ty Bernice Savant, [facility]   Session Time: 8:02 am ( 55 minutes)  Participation Level: Active  Behavioral Response: CasualAlertWNL  Type of Therapy: Individual Therapy  Treatment Goals addressed:  STG: AnxietyAsia increase management of anxiety AEB ability to reframe anxious distorted thoughts with ability to engage in community at least x 1 daily for a period of 90 days.    ProgressTowards Goals: Progressing  Interventions: CBT and Supportive  Summary:  Denise Barker is a 38 y.o. female who presents with dx of major depression moderate, generalized anxiety and cannabis use, paranoia. Denise Barker presents for session alert and oriented; mood and affect low.  Speech clear and coherent at normal rate and tone. Shares for moods to be  I am trying to be ok. Shares presence of financial stressors with currently being out of work. Shares moods to have been lower but coping. Shares engagement with friendships and engage in community. Shares thoughts on interaction in  relationship and friendships and increased interaction with friendships in balanced manner. Shares working to reframe anxious thoughts as needed. Denies safety concerns.    Suicidal/Homicidal: Nowithout intent/plan  Therapist Response: Therapist engaged Darrion in tele-therapy session. Assessed for current location and confidentiality of session. Completed check in and assessed for current level of stressors, sxs management and level of functioning. Supported in processing current concerns of finances and upcoming employment. Encouraged ongoing ability to communicate thoughts and process anxious thoughts in balanced manner. Explores hx of friendships and signs of healthy interactions with others. Reviewed session and provided follow up.   Plan: Return again in  x 4 weeks.  Diagnosis: Generalized anxiety disorder  MDD (major depressive disorder), recurrent episode, mild  Collaboration of Care: Other Referral to [facility]  Patient/Guardian was advised Release of Information must be obtained prior to any record release in order to collaborate their care with an outside provider. Patient/Guardian was advised if they have not already done so to contact the registration department to sign all necessary forms in order for us  to release information regarding their care.   Consent: Patient/Guardian gives verbal consent for treatment and assignment of benefits for services provided during this visit. Patient/Guardian expressed understanding and agreed to proceed.   Ty Bernice [place], [facility] 09/29/2024

## 2024-11-03 ENCOUNTER — Ambulatory Visit (HOSPITAL_COMMUNITY): Admitting: Mental Health

## 2024-11-03 DIAGNOSIS — F22 Delusional disorders: Secondary | ICD-10-CM

## 2024-11-03 DIAGNOSIS — F330 Major depressive disorder, recurrent, mild: Secondary | ICD-10-CM

## 2024-11-03 DIAGNOSIS — F411 Generalized anxiety disorder: Secondary | ICD-10-CM

## 2024-11-03 NOTE — Progress Notes (Signed)
 "  THERAPIST PROGRESS NOTE Virtual Visit via Video Note  I connected with Denise Barker on 11/03/24 at  8:00 AM EST by a video enabled telemedicine application and verified that I am speaking with the correct person using two identifiers.  Location: Patient: home address on file Provider: remote office   I discussed the limitations of evaluation and management by telemedicine and the availability of in person appointments. The patient expressed understanding and agreed to proceed.  I discussed the assessment and treatment plan with the patient. The patient was provided an opportunity to ask questions and all were answered. The patient agreed with the plan and demonstrated an understanding of the instructions.   The patient was advised to call back or seek an in-person evaluation if the symptoms worsen or if the condition fails to improve as anticipated.  I provided 53 minutes of non-face-to-face time during this encounter.   Ty Bernice Savant, [facility]   Session Time: 8:03am (  Participation Level: Active  Behavioral Response: CasualAlertEuthymic  Type of Therapy: Individual Therapy  Treatment Goals addressed: STG: AnxietyAsia increase management of anxiety AEB ability to reframe anxious distorted thoughts with ability to engage in community at least x 1 daily for a period of 90 days.    ProgressTowards Goals: Progressing  Interventions: CBT and Supportive  Summary: Denise Barker is a 39 y.o. female who presents with dx of major depression moderate, generalized anxiety and cannabis use, paranoia. Denise Barker presents for session alert and oriented; mood and affect adequate.  Speech clear and coherent at normal rate and tone. Shares for moods to be  I am doing ok. Grateful to have started working [employer] on resuming employment. Engages with therapist and reports ways in which confidence and self-esteem has effected her thought process. Shares event at work in thoughts of lacking  ability to complete needed task and anxiety felt almost lead to choice of ceasing employment in which she needs. Notes after completing task at 100 percent correctness able to challenge old beliefs of self and identify various ways in which negtive thought process and concept of self has effected her, It makes me sad and angry at myself. Explores with therapist working to challenged and reframe distortions of self and working to increase feelings of self-confidence. Shares thoughts on relationship to anxiety in public places. Notes for this to continue to hold however improvement in once she presents to place for anxiety to decrease. Agrees to continue to work on reframing thoughts and increasing feelings of self. Denies safety concerns, ongoing work towards goals.   Suicidal/Homicidal: Nowithout intent/plan  Therapist Response: Therapist engaged Denise Barker in tele-therapy session. Assessed for current location and confidentiality of session. Completed check in and assessed for current level of stressors, sxs level of functioning. Supported in processing current employment and ability to perform functions of the position. Supported indentfying anxious thought and core belief present. Supported in processing thoughts and challenging old belief systems in which evidence present to support inaccuracies of thought. Explores thinking process and connection to feelings and behaviors. Encouraged increased awareness of thinking patterns, presence of maladaptive thoughts and working to reframe. Reviewed session and provided follow up. Plan: Return again in  x 4 weeks.  Diagnosis: Generalized anxiety disorder  Paranoia (psychosis) (HCC)  MDD (major depressive disorder), recurrent episode, mild  Collaboration of Care: Other None  Patient/Guardian was advised Release of Information must be obtained prior to any record release in order to collaborate their care with an outside provider. Patient/Guardian was  advised if  they have not already done so to contact the registration department to sign all necessary forms in order for us  to release information regarding their care.   Consent: Patient/Guardian gives verbal consent for treatment and assignment of benefits for services provided during this visit. Patient/Guardian expressed understanding and agreed to proceed.   Ty Asal [place], [facility] 11/03/2024  "

## 2024-11-24 ENCOUNTER — Ambulatory Visit (HOSPITAL_COMMUNITY): Admitting: Mental Health

## 2024-11-24 NOTE — Progress Notes (Unsigned)
" ° °  THERAPIST PROGRESS NOTE Virtual Visit via Video Note  I connected with Denise Barker on 11/24/24 at  9:00 AM EST by a video enabled telemedicine application and verified that I am speaking with the correct person using two identifiers.  Location: Patient: home address on file Provider: remote office   I discussed the limitations of evaluation and management by telemedicine and the availability of in person appointments. The patient expressed understanding and agreed to proceed.  I discussed the assessment and treatment plan with the patient. The patient was provided an opportunity to ask questions and all were answered. The patient agreed with the plan and demonstrated an understanding of the instructions.   The patient was advised to call back or seek an in-person evaluation if the symptoms worsen or if the condition fails to improve as anticipated.  I provided *** minutes of non-face-to-face time during this encounter.   Ty Bernice Savant, [facility]   Session Time: 9:03 am (   Participation Level: Active  Behavioral Response: CasualAlertIrritable  Type of Therapy: Individual Therapy  Treatment Goals addressed:  STG: AnxietyAsia increase management of anxiety AEB ability to reframe anxious distorted thoughts with ability to engage in community at least x 1 daily for a period of 90 days.    ProgressTowards Goals: Progressing  Interventions: Supportive  Summary: Denise Barker is a 39 y.o. female who presents with dx of major depression moderate, generalized anxiety and cannabis use, paranoia. Denise Barker presents for session alert and oriented; mood and affect adequate.  Speech clear and coherent at normal rate and tone. Shares for moods to be  I am doing ok.   Suicidal/Homicidal: Nowithout intent/plan  Therapist Response:  Therapist engaged Denise Barker in tele-therapy session. Assessed for current location and confidentiality of session. Completed check in and assessed for current level of  stressors, sxs level of functioning. Supported in processing current employment and ability to perform functions of the position. Supported indentfying   Plan: Return again in *** weeks.  Diagnosis: No diagnosis found.  Collaboration of Care: Other None  Patient/Guardian was advised Release of Information must be obtained prior to any record release in order to collaborate their care with an outside provider. Patient/Guardian was advised if they have not already done so to contact the registration department to sign all necessary forms in order for us  to release information regarding their care.   Consent: Patient/Guardian gives verbal consent for treatment and assignment of benefits for services provided during this visit. Patient/Guardian expressed understanding and agreed to proceed.   Ty Bernice [place], [facility] 11/24/2024  "

## 2024-12-22 ENCOUNTER — Ambulatory Visit (HOSPITAL_COMMUNITY): Admitting: Mental Health

## 2025-01-12 ENCOUNTER — Ambulatory Visit (HOSPITAL_COMMUNITY): Admitting: Mental Health
